# Patient Record
Sex: Female | Born: 1940 | Race: White | Hispanic: No | Smoking: Never smoker
Health system: Southern US, Community
[De-identification: ages and names within clinical notes are randomized; demographics above are authoritative.]

## PROBLEM LIST (undated history)

## (undated) DIAGNOSIS — F0280 Dementia in other diseases classified elsewhere without behavioral disturbance: Secondary | ICD-10-CM

## (undated) DIAGNOSIS — E079 Disorder of thyroid, unspecified: Secondary | ICD-10-CM

## (undated) DIAGNOSIS — I639 Cerebral infarction, unspecified: Secondary | ICD-10-CM

## (undated) DIAGNOSIS — M5136 Other intervertebral disc degeneration, lumbar region: Secondary | ICD-10-CM

## (undated) DIAGNOSIS — M4800 Spinal stenosis, site unspecified: Secondary | ICD-10-CM

## (undated) DIAGNOSIS — F259 Schizoaffective disorder, unspecified: Secondary | ICD-10-CM

## (undated) DIAGNOSIS — E119 Type 2 diabetes mellitus without complications: Secondary | ICD-10-CM

## (undated) DIAGNOSIS — J309 Allergic rhinitis, unspecified: Secondary | ICD-10-CM

## (undated) DIAGNOSIS — F0390 Unspecified dementia without behavioral disturbance: Secondary | ICD-10-CM

## (undated) DIAGNOSIS — N3010 Interstitial cystitis (chronic) without hematuria: Secondary | ICD-10-CM

## (undated) DIAGNOSIS — E7800 Pure hypercholesterolemia, unspecified: Secondary | ICD-10-CM

## (undated) DIAGNOSIS — F419 Anxiety disorder, unspecified: Secondary | ICD-10-CM

## (undated) DIAGNOSIS — G309 Alzheimer's disease, unspecified: Secondary | ICD-10-CM

## (undated) DIAGNOSIS — M51369 Other intervertebral disc degeneration, lumbar region without mention of lumbar back pain or lower extremity pain: Secondary | ICD-10-CM

## (undated) DIAGNOSIS — I10 Essential (primary) hypertension: Secondary | ICD-10-CM

## (undated) DIAGNOSIS — F250 Schizoaffective disorder, bipolar type: Secondary | ICD-10-CM

## (undated) DIAGNOSIS — I493 Ventricular premature depolarization: Secondary | ICD-10-CM

## (undated) HISTORY — PX: IMPLANTATION / PLACEMENT EPIDURAL NEUROSTIMULATOR ELECTRODES: SUR687

## (undated) HISTORY — PX: TUBAL LIGATION: SHX77

## (undated) HISTORY — DX: Interstitial cystitis (chronic) without hematuria: N30.10

## (undated) HISTORY — DX: Spinal stenosis, site unspecified: M48.00

## (undated) HISTORY — DX: Other intervertebral disc degeneration, lumbar region: M51.36

## (undated) HISTORY — DX: Type 2 diabetes mellitus without complications: E11.9

## (undated) HISTORY — DX: Ventricular premature depolarization: I49.3

## (undated) HISTORY — DX: Other intervertebral disc degeneration, lumbar region without mention of lumbar back pain or lower extremity pain: M51.369

## (undated) HISTORY — PX: FOOT SURGERY: SHX648

## (undated) HISTORY — PX: OTHER SURGICAL HISTORY: SHX169

## (undated) HISTORY — DX: Anxiety disorder, unspecified: F41.9

## (undated) HISTORY — DX: Dementia in other diseases classified elsewhere, unspecified severity, without behavioral disturbance, psychotic disturbance, mood disturbance, and anxiety: F02.80

## (undated) HISTORY — DX: Allergic rhinitis, unspecified: J30.9

## (undated) HISTORY — DX: Alzheimer's disease, unspecified: G30.9

## (undated) HISTORY — DX: Pure hypercholesterolemia, unspecified: E78.00

---

## 1998-01-22 ENCOUNTER — Encounter: Admission: RE | Admit: 1998-01-22 | Discharge: 1998-04-22 | Payer: Self-pay | Admitting: Anesthesiology

## 1998-03-11 ENCOUNTER — Other Ambulatory Visit: Admission: RE | Admit: 1998-03-11 | Discharge: 1998-03-11 | Payer: Self-pay | Admitting: *Deleted

## 1998-06-24 ENCOUNTER — Encounter: Admission: RE | Admit: 1998-06-24 | Discharge: 1998-09-22 | Payer: Self-pay | Admitting: Anesthesiology

## 1998-10-23 ENCOUNTER — Encounter: Payer: Self-pay | Admitting: Urology

## 1998-10-28 ENCOUNTER — Observation Stay (HOSPITAL_COMMUNITY): Admission: RE | Admit: 1998-10-28 | Discharge: 1998-10-29 | Payer: Self-pay | Admitting: Urology

## 1999-08-07 ENCOUNTER — Ambulatory Visit (HOSPITAL_COMMUNITY): Admission: RE | Admit: 1999-08-07 | Discharge: 1999-08-07 | Payer: Self-pay | Admitting: *Deleted

## 2000-08-05 ENCOUNTER — Ambulatory Visit (HOSPITAL_COMMUNITY): Admission: RE | Admit: 2000-08-05 | Discharge: 2000-08-05 | Payer: Self-pay | Admitting: Anesthesiology

## 2000-08-08 ENCOUNTER — Encounter: Payer: Self-pay | Admitting: Anesthesiology

## 2000-08-08 ENCOUNTER — Ambulatory Visit (HOSPITAL_COMMUNITY): Admission: RE | Admit: 2000-08-08 | Discharge: 2000-08-08 | Payer: Self-pay | Admitting: Anesthesiology

## 2000-11-13 ENCOUNTER — Emergency Department (HOSPITAL_COMMUNITY): Admission: EM | Admit: 2000-11-13 | Discharge: 2000-11-13 | Payer: Self-pay | Admitting: Emergency Medicine

## 2001-02-01 ENCOUNTER — Encounter: Payer: Self-pay | Admitting: Internal Medicine

## 2001-02-01 ENCOUNTER — Encounter: Admission: RE | Admit: 2001-02-01 | Discharge: 2001-02-01 | Payer: Self-pay | Admitting: Internal Medicine

## 2004-07-29 ENCOUNTER — Emergency Department (HOSPITAL_COMMUNITY): Admission: EM | Admit: 2004-07-29 | Discharge: 2004-07-30 | Payer: Self-pay

## 2004-09-29 ENCOUNTER — Emergency Department (HOSPITAL_COMMUNITY): Admission: EM | Admit: 2004-09-29 | Discharge: 2004-09-29 | Payer: Self-pay | Admitting: *Deleted

## 2004-10-29 ENCOUNTER — Encounter: Admission: RE | Admit: 2004-10-29 | Discharge: 2004-10-29 | Payer: Self-pay | Admitting: Otolaryngology

## 2004-11-23 ENCOUNTER — Emergency Department (HOSPITAL_COMMUNITY): Admission: EM | Admit: 2004-11-23 | Discharge: 2004-11-23 | Payer: Self-pay | Admitting: Emergency Medicine

## 2004-11-23 ENCOUNTER — Encounter: Admission: RE | Admit: 2004-11-23 | Discharge: 2004-11-23 | Payer: Self-pay | Admitting: Internal Medicine

## 2004-11-24 ENCOUNTER — Ambulatory Visit: Payer: Self-pay | Admitting: Psychiatry

## 2004-11-24 ENCOUNTER — Inpatient Hospital Stay (HOSPITAL_COMMUNITY): Admission: RE | Admit: 2004-11-24 | Discharge: 2004-11-27 | Payer: Self-pay | Admitting: Psychiatry

## 2004-12-01 ENCOUNTER — Emergency Department (HOSPITAL_COMMUNITY): Admission: EM | Admit: 2004-12-01 | Discharge: 2004-12-01 | Payer: Self-pay | Admitting: Emergency Medicine

## 2004-12-05 ENCOUNTER — Emergency Department (HOSPITAL_COMMUNITY): Admission: EM | Admit: 2004-12-05 | Discharge: 2004-12-06 | Payer: Self-pay | Admitting: Emergency Medicine

## 2004-12-11 ENCOUNTER — Encounter: Admission: RE | Admit: 2004-12-11 | Discharge: 2004-12-11 | Payer: Self-pay | Admitting: Internal Medicine

## 2004-12-20 ENCOUNTER — Observation Stay (HOSPITAL_COMMUNITY): Admission: RE | Admit: 2004-12-20 | Discharge: 2004-12-23 | Payer: Self-pay | Admitting: Emergency Medicine

## 2004-12-21 ENCOUNTER — Encounter: Payer: Self-pay | Admitting: Cardiology

## 2004-12-25 ENCOUNTER — Other Ambulatory Visit (HOSPITAL_COMMUNITY): Admission: RE | Admit: 2004-12-25 | Discharge: 2005-03-25 | Payer: Self-pay | Admitting: Psychiatry

## 2004-12-25 ENCOUNTER — Ambulatory Visit: Payer: Self-pay | Admitting: Psychiatry

## 2005-01-22 ENCOUNTER — Ambulatory Visit (HOSPITAL_COMMUNITY): Payer: Self-pay | Admitting: Psychiatry

## 2005-01-27 ENCOUNTER — Ambulatory Visit (HOSPITAL_COMMUNITY): Payer: Self-pay | Admitting: Psychiatry

## 2005-02-03 ENCOUNTER — Ambulatory Visit: Payer: Self-pay | Admitting: Psychiatry

## 2005-02-03 ENCOUNTER — Ambulatory Visit (HOSPITAL_COMMUNITY): Payer: Self-pay | Admitting: Psychiatry

## 2008-04-15 ENCOUNTER — Emergency Department (HOSPITAL_COMMUNITY): Admission: EM | Admit: 2008-04-15 | Discharge: 2008-04-16 | Payer: Self-pay | Admitting: Emergency Medicine

## 2008-08-08 ENCOUNTER — Emergency Department (HOSPITAL_COMMUNITY): Admission: EM | Admit: 2008-08-08 | Discharge: 2008-08-09 | Payer: Self-pay | Admitting: Emergency Medicine

## 2008-11-19 ENCOUNTER — Encounter: Admission: RE | Admit: 2008-11-19 | Discharge: 2008-11-19 | Payer: Self-pay | Admitting: Family Medicine

## 2008-12-20 ENCOUNTER — Emergency Department (HOSPITAL_COMMUNITY): Admission: EM | Admit: 2008-12-20 | Discharge: 2008-12-20 | Payer: Self-pay | Admitting: Emergency Medicine

## 2009-04-02 ENCOUNTER — Emergency Department (HOSPITAL_COMMUNITY): Admission: EM | Admit: 2009-04-02 | Discharge: 2009-04-02 | Payer: Self-pay | Admitting: Emergency Medicine

## 2009-04-08 ENCOUNTER — Inpatient Hospital Stay (HOSPITAL_COMMUNITY): Admission: EM | Admit: 2009-04-08 | Discharge: 2009-04-12 | Payer: Self-pay | Admitting: Emergency Medicine

## 2009-11-01 ENCOUNTER — Emergency Department (HOSPITAL_BASED_OUTPATIENT_CLINIC_OR_DEPARTMENT_OTHER): Admission: EM | Admit: 2009-11-01 | Discharge: 2009-11-01 | Payer: Self-pay | Admitting: Emergency Medicine

## 2009-11-01 ENCOUNTER — Ambulatory Visit: Payer: Self-pay | Admitting: Radiology

## 2009-11-20 ENCOUNTER — Ambulatory Visit: Payer: Self-pay | Admitting: Cardiology

## 2009-11-20 ENCOUNTER — Inpatient Hospital Stay (HOSPITAL_COMMUNITY): Admission: EM | Admit: 2009-11-20 | Discharge: 2009-11-26 | Payer: Self-pay | Admitting: Emergency Medicine

## 2009-11-21 ENCOUNTER — Encounter (INDEPENDENT_AMBULATORY_CARE_PROVIDER_SITE_OTHER): Payer: Self-pay | Admitting: Internal Medicine

## 2009-12-26 ENCOUNTER — Encounter: Admission: RE | Admit: 2009-12-26 | Discharge: 2009-12-26 | Payer: Self-pay | Admitting: Family Medicine

## 2010-05-26 ENCOUNTER — Emergency Department (HOSPITAL_COMMUNITY)
Admission: EM | Admit: 2010-05-26 | Discharge: 2010-08-31 | Payer: Self-pay | Source: Home / Self Care | Admitting: Emergency Medicine

## 2010-06-28 IMAGING — CT CT HEAD W/O CM
1 of 2 series · 16 of 30 positions shown, 20 images · non-contrast
Comparison: The head CT 12/20/2008

CLINICAL DATA: Medical clearance.  History of psychiatric disorder
spleen

CT HEAD WITHOUT CONTRAST
TECHNIQUE: Contiguous axial images were obtained from the base of
the skull through the vertex without contrast.

[Series 3: recon 2: brain · axial · 0.47mm/px · z∈[+127,+273]mm · 16 of 64 slices shown, 20 images]
[im 4/64  brain]
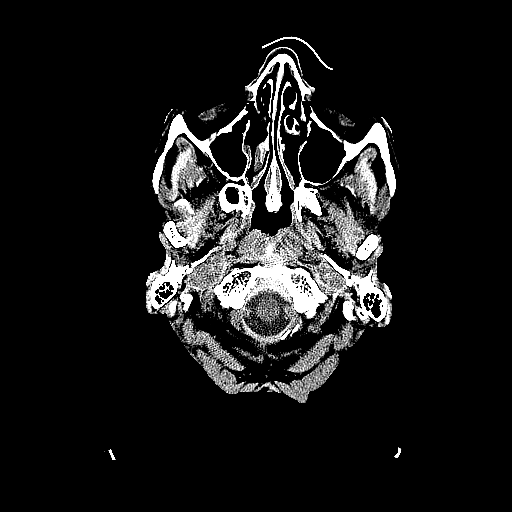
[im 4/64  bone]
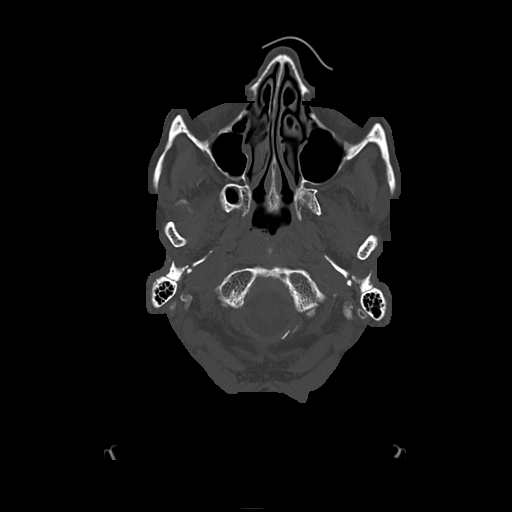
[im 7/64  brain]
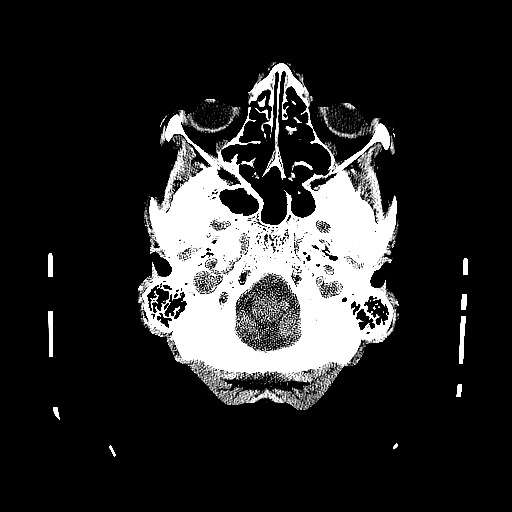
[im 10/64  brain]
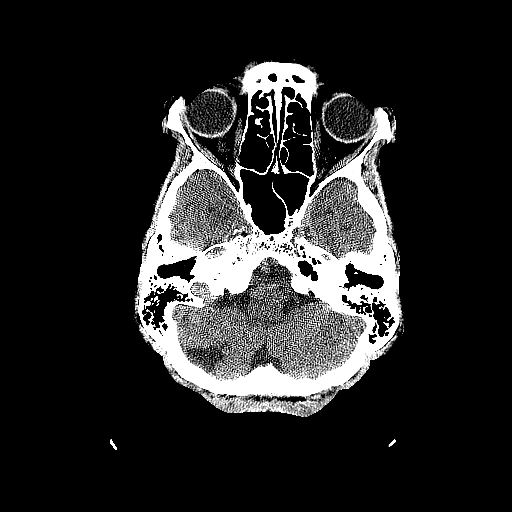
[im 14/64  brain]
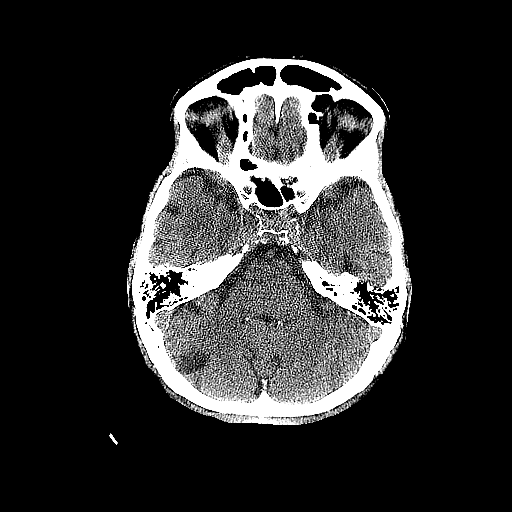
[im 20/64  brain]
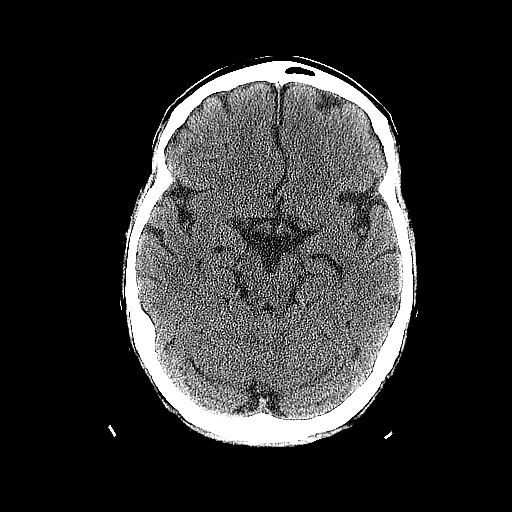
[im 20/64  bone]
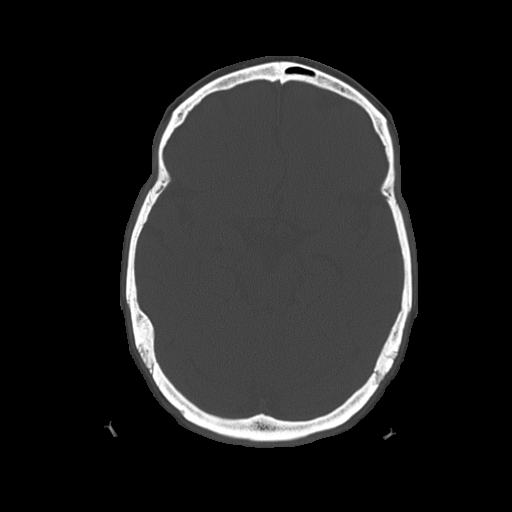
[im 24/64  brain]
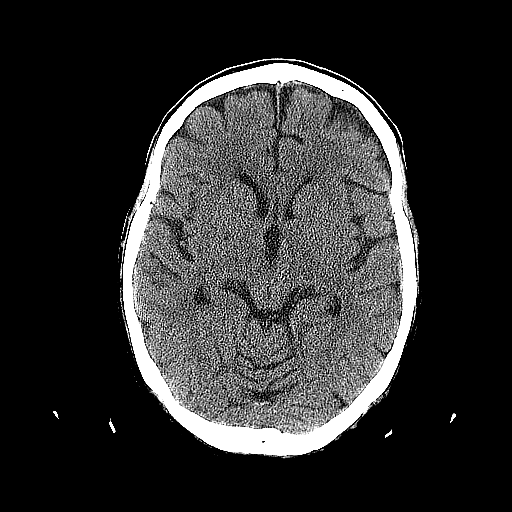
[im 27/64  brain]
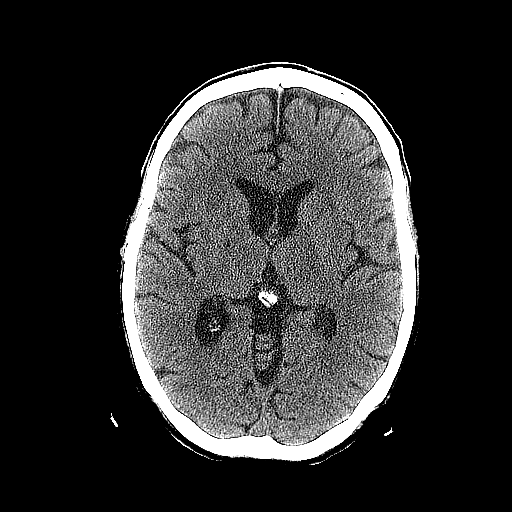
[im 30/64  brain]
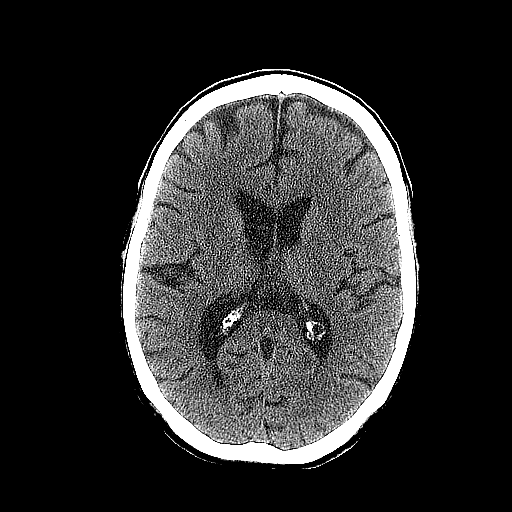
[im 34/64  brain]
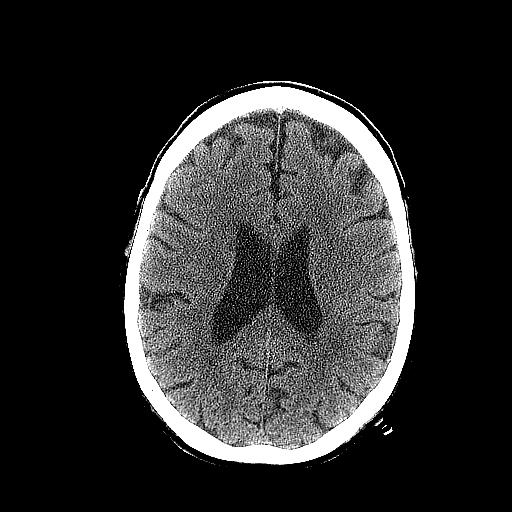
[im 34/64  bone]
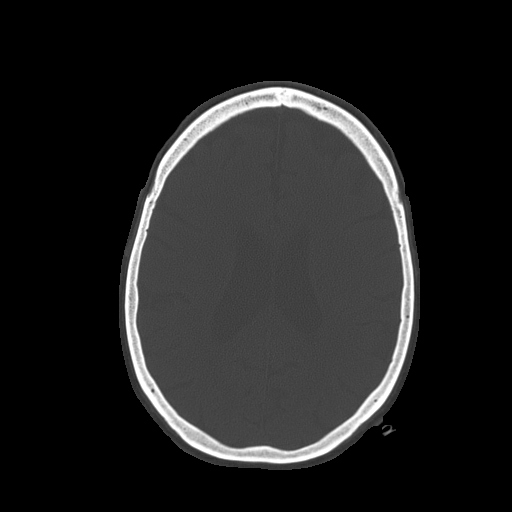
[im 37/64  brain]
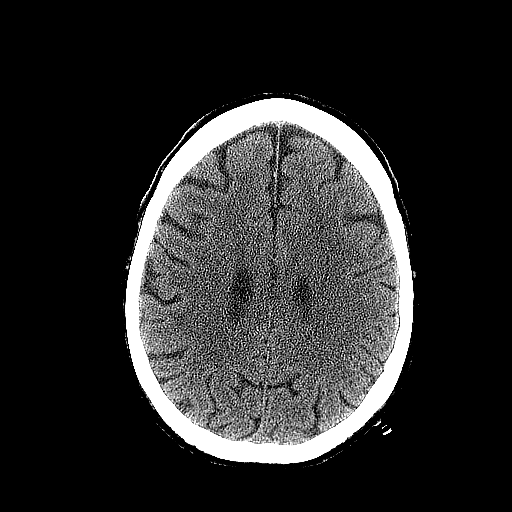
[im 40/64  brain]
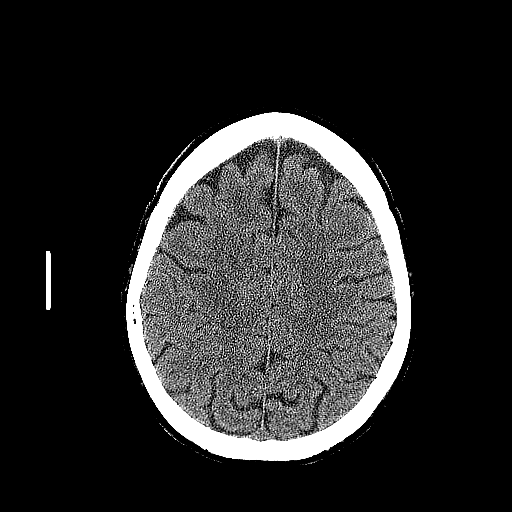
[im 44/64  brain]
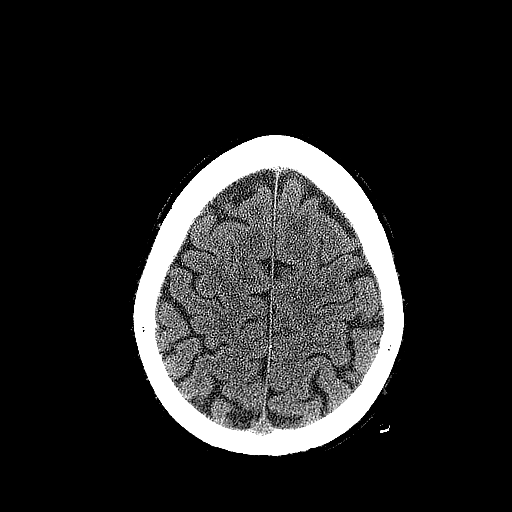
[im 50/64  brain]
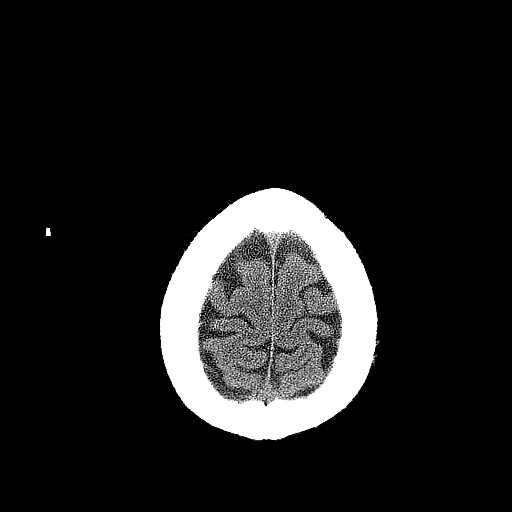
[im 50/64  bone]
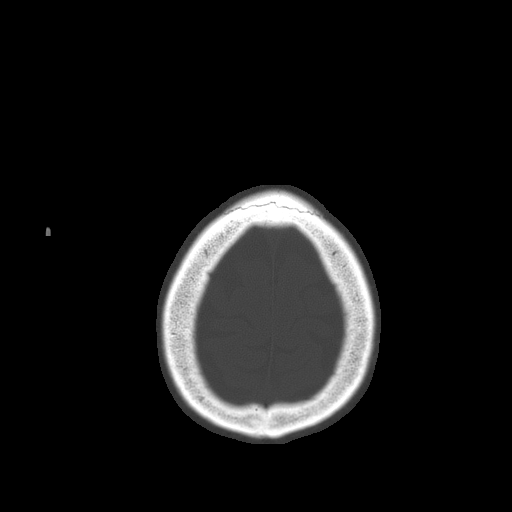
[im 54/64  brain]
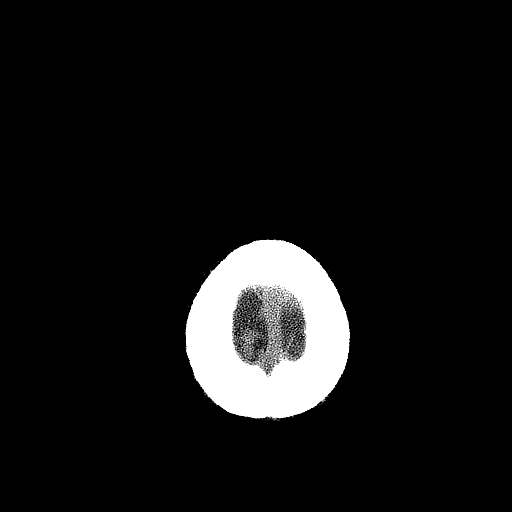
[im 57/64  brain]
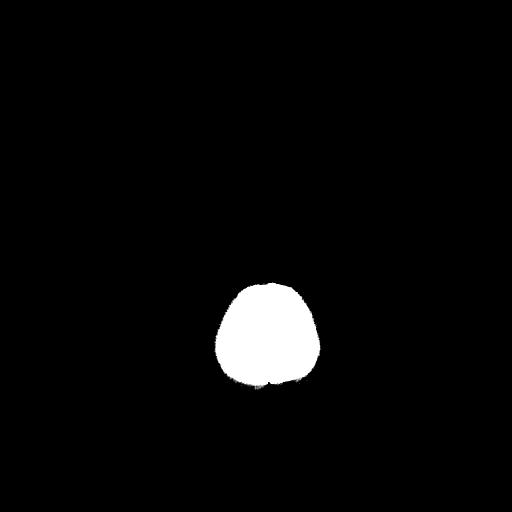
[im 60/64  brain]
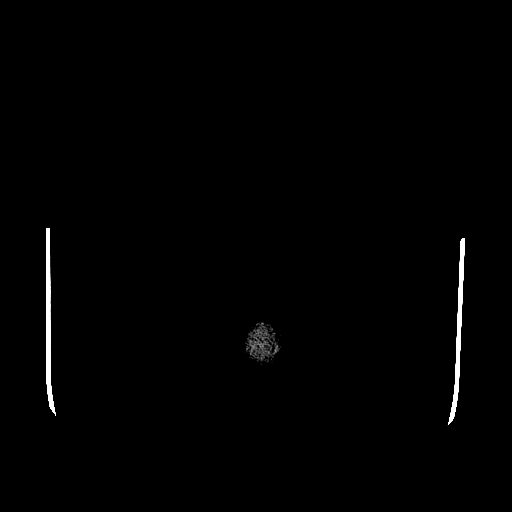

[16 of 30 positions shown; findings below may reference images not displayed]

FINDINGS: Appearance of the brain is stable.  A wedge-shaped area
of hypoattenuation in the right cerebellar hemisphere is unchanged
and compatible with remote cerebellar infarction.  Remote lacunar
infarction within the right putamen is stable. Mild cerebral volume
loss is stable.

Negative for hemorrhage, hydrocephalus, midline shift, mass lesion,
or evidence of acute infarction.

The visualized paranasal sinuses and mastoid air cells are clear.
The calvarium is intact.
IMPRESSION: No acute intracranial abnormality.  Stable head CT.

## 2010-08-19 ENCOUNTER — Emergency Department (HOSPITAL_COMMUNITY)
Admission: EM | Admit: 2010-08-19 | Discharge: 2010-08-19 | Payer: Self-pay | Source: Home / Self Care | Admitting: Emergency Medicine

## 2010-08-22 ENCOUNTER — Encounter: Payer: Self-pay | Admitting: Internal Medicine

## 2010-08-23 ENCOUNTER — Encounter: Payer: Self-pay | Admitting: Otolaryngology

## 2010-08-24 LAB — DIFFERENTIAL
Basophils Absolute: 0 10*3/uL (ref 0.0–0.1)
Basophils Relative: 1 % (ref 0–1)
Eosinophils Absolute: 0.1 10*3/uL (ref 0.0–0.7)
Eosinophils Relative: 2 % (ref 0–5)
Lymphocytes Relative: 28 % (ref 12–46)
Monocytes Absolute: 0.4 10*3/uL (ref 0.1–1.0)
Neutro Abs: 3.2 10*3/uL (ref 1.7–7.7)
Neutrophils Relative %: 62 % (ref 43–77)

## 2010-08-24 LAB — POCT CARDIAC MARKERS
CKMB, poc: 3.3 ng/mL (ref 1.0–8.0)
Troponin i, poc: <0.05 ng/mL (ref 0.00–0.09)

## 2010-08-24 LAB — CBC
Hemoglobin: 13.7 g/dL (ref 12.0–15.0)
MCH: 30.6 pg (ref 26.0–34.0)
MCV: 91.1 fL (ref 78.0–100.0)
Platelets: 175 10*3/uL (ref 150–400)
RBC: 4.48 MIL/uL (ref 3.87–5.11)
WBC: 5.1 10*3/uL (ref 4.0–10.5)

## 2010-08-24 LAB — POCT I-STAT, CHEM 8
BUN: 27 mg/dL — ABNORMAL HIGH (ref 6–23)
Creatinine, Ser: 0.7 mg/dL (ref 0.4–1.2)
HCT: 42 % (ref 36.0–46.0)
Hemoglobin: 14.3 g/dL (ref 12.0–15.0)
Potassium: 3.3 mEq/L — ABNORMAL LOW (ref 3.5–5.1)
Sodium: 141 mEq/L (ref 135–145)
TCO2: 30 mmol/L (ref 0–100)

## 2010-08-24 LAB — URINALYSIS, ROUTINE W REFLEX MICROSCOPIC
Bilirubin Urine: NEGATIVE
Nitrite: NEGATIVE
Specific Gravity, Urine: 1.022 (ref 1.005–1.030)
Urobilinogen, UA: 0.2 mg/dL (ref 0.0–1.0)
pH: 6.5 (ref 5.0–8.0)

## 2010-08-24 LAB — URINE MICROSCOPIC-ADD ON

## 2010-08-24 LAB — URINE CULTURE: Colony Count: 55000

## 2010-10-14 LAB — COMPREHENSIVE METABOLIC PANEL
AST: 26 U/L (ref 0–37)
Albumin: 3.7 g/dL (ref 3.5–5.2)
Alkaline Phosphatase: 69 U/L (ref 39–117)
BUN: 38 mg/dL — ABNORMAL HIGH (ref 6–23)
Chloride: 107 mEq/L (ref 96–112)
GFR calc Af Amer: >60 mL/min (ref >60)
Potassium: 3.9 mEq/L (ref 3.5–5.1)
Total Bilirubin: 1.1 mg/dL (ref 0.3–1.2)
Total Protein: 6.5 g/dL (ref 6.0–8.3)

## 2010-10-14 LAB — URINALYSIS, ROUTINE W REFLEX MICROSCOPIC
Bilirubin Urine: NEGATIVE
Hgb urine dipstick: NEGATIVE
Ketones, ur: 15 mg/dL — AB
Specific Gravity, Urine: 1.027 (ref 1.005–1.030)
pH: 5.5 (ref 5.0–8.0)

## 2010-10-14 LAB — RAPID URINE DRUG SCREEN, HOSP PERFORMED
Barbiturates: NOT DETECTED
Cocaine: NOT DETECTED
Opiates: NOT DETECTED

## 2010-10-14 LAB — DIFFERENTIAL
Basophils Relative: 0 % (ref 0–1)
Eosinophils Relative: 0 % (ref 0–5)
Lymphocytes Relative: 13 % (ref 12–46)
Monocytes Absolute: 0.5 10*3/uL (ref 0.1–1.0)
Monocytes Relative: 6 % (ref 3–12)
Neutro Abs: 5.8 10*3/uL (ref 1.7–7.7)

## 2010-10-14 LAB — CBC
MCV: 88.5 fL (ref 78.0–100.0)
Platelets: 185 10*3/uL (ref 150–400)
RBC: 4.8 MIL/uL (ref 3.87–5.11)
RDW: 11.9 % (ref 11.5–15.5)
WBC: 7.2 10*3/uL (ref 4.0–10.5)

## 2010-10-14 LAB — URINE MICROSCOPIC-ADD ON

## 2010-10-14 LAB — GLUCOSE, CAPILLARY: Glucose-Capillary: 119 mg/dL — ABNORMAL HIGH (ref 70–99)

## 2010-10-14 LAB — ETHANOL: Alcohol, Ethyl (B): <5 mg/dL (ref 0–10)

## 2010-10-20 LAB — DIFFERENTIAL
Eosinophils Relative: 2 % (ref 0–5)
Lymphocytes Relative: 23 % (ref 12–46)
Lymphs Abs: 1.4 10*3/uL (ref 0.7–4.0)
Monocytes Absolute: 0.4 10*3/uL (ref 0.1–1.0)
Monocytes Relative: 7 % (ref 3–12)

## 2010-10-20 LAB — COMPREHENSIVE METABOLIC PANEL
AST: 20 U/L (ref 0–37)
Albumin: 3.5 g/dL (ref 3.5–5.2)
Calcium: 9 mg/dL (ref 8.4–10.5)
Creatinine, Ser: 0.63 mg/dL (ref 0.4–1.2)
GFR calc Af Amer: >60 mL/min (ref >60)
Sodium: 138 mEq/L (ref 135–145)

## 2010-10-20 LAB — BASIC METABOLIC PANEL
Calcium: 9.1 mg/dL (ref 8.4–10.5)
GFR calc Af Amer: >60 mL/min (ref >60)
GFR calc non Af Amer: >60 mL/min (ref >60)
Sodium: 144 mEq/L (ref 135–145)

## 2010-10-20 LAB — CBC
HCT: 35.2 % — ABNORMAL LOW (ref 36.0–46.0)
Hemoglobin: 11.5 g/dL — ABNORMAL LOW (ref 12.0–15.0)
Hemoglobin: 11.9 g/dL — ABNORMAL LOW (ref 12.0–15.0)
MCHC: 34 g/dL (ref 30.0–36.0)
RBC: 3.6 MIL/uL — ABNORMAL LOW (ref 3.87–5.11)
RBC: 3.74 MIL/uL — ABNORMAL LOW (ref 3.87–5.11)
WBC: 5.9 10*3/uL (ref 4.0–10.5)

## 2010-10-20 LAB — CARDIAC PANEL(CRET KIN+CKTOT+MB+TROPI)
CK, MB: 0.8 ng/mL (ref 0.3–4.0)
CK, MB: 0.8 ng/mL (ref 0.3–4.0)
Relative Index: INVALID (ref 0.0–2.5)
Total CK: 28 U/L (ref 7–177)
Total CK: 33 U/L (ref 7–177)

## 2010-10-20 LAB — PROTIME-INR: Prothrombin Time: 12.6 seconds (ref 11.6–15.2)

## 2010-10-20 LAB — POCT I-STAT, CHEM 8
BUN: 35 mg/dL — ABNORMAL HIGH (ref 6–23)
Chloride: 108 mEq/L (ref 96–112)
Creatinine, Ser: 0.8 mg/dL (ref 0.4–1.2)
Glucose, Bld: 110 mg/dL — ABNORMAL HIGH (ref 70–99)
HCT: 35 % — ABNORMAL LOW (ref 36.0–46.0)
Potassium: 4.1 mEq/L (ref 3.5–5.1)

## 2010-10-20 LAB — POCT CARDIAC MARKERS
CKMB, poc: <1 ng/mL — ABNORMAL LOW (ref 1.0–8.0)
Troponin i, poc: <0.05 ng/mL (ref 0.00–0.09)

## 2010-10-20 LAB — CK TOTAL AND CKMB (NOT AT ARMC): CK, MB: 0.9 ng/mL (ref 0.3–4.0)

## 2010-10-21 LAB — POCT CARDIAC MARKERS
CKMB, poc: 1.3 ng/mL (ref 1.0–8.0)
CKMB, poc: 2.2 ng/mL (ref 1.0–8.0)
Troponin i, poc: <0.05 ng/mL (ref 0.00–0.09)

## 2010-10-21 LAB — DIFFERENTIAL
Basophils Absolute: 0 10*3/uL (ref 0.0–0.1)
Basophils Relative: 0 % (ref 0–1)
Eosinophils Relative: 0 % (ref 0–5)
Lymphocytes Relative: 12 % (ref 12–46)
Lymphocytes Relative: 14 % (ref 12–46)
Lymphs Abs: 1 10*3/uL (ref 0.7–4.0)
Monocytes Absolute: 0.3 10*3/uL (ref 0.1–1.0)
Monocytes Relative: 4 % (ref 3–12)
Monocytes Relative: 5 % (ref 3–12)
Neutro Abs: 7.3 10*3/uL (ref 1.7–7.7)
Neutrophils Relative %: 83 % — ABNORMAL HIGH (ref 43–77)

## 2010-10-21 LAB — URINALYSIS, ROUTINE W REFLEX MICROSCOPIC
Bilirubin Urine: NEGATIVE
Leukocytes, UA: NEGATIVE
Nitrite: NEGATIVE
Specific Gravity, Urine: 1.019 (ref 1.005–1.030)
Urobilinogen, UA: 0.2 mg/dL (ref 0.0–1.0)

## 2010-10-21 LAB — COMPREHENSIVE METABOLIC PANEL
BUN: 25 mg/dL — ABNORMAL HIGH (ref 6–23)
Calcium: 9.8 mg/dL (ref 8.4–10.5)
Glucose, Bld: 154 mg/dL — ABNORMAL HIGH (ref 70–99)
Sodium: 144 mEq/L (ref 135–145)
Total Protein: 7.6 g/dL (ref 6.0–8.3)

## 2010-10-21 LAB — URINE CULTURE: Colony Count: 10000

## 2010-10-21 LAB — CBC
HCT: 38.7 % (ref 36.0–46.0)
HCT: 41.8 % (ref 36.0–46.0)
Hemoglobin: 13 g/dL (ref 12.0–15.0)
Hemoglobin: 13.7 g/dL (ref 12.0–15.0)
MCHC: 32.8 g/dL (ref 30.0–36.0)
MCHC: 33.7 g/dL (ref 30.0–36.0)
RDW: 12.1 % (ref 11.5–15.5)
RDW: 12.3 % (ref 11.5–15.5)

## 2010-10-21 LAB — POCT TOXICOLOGY PANEL

## 2010-10-21 LAB — URINE MICROSCOPIC-ADD ON

## 2010-10-21 LAB — BASIC METABOLIC PANEL
CO2: 29 mEq/L (ref 19–32)
Glucose, Bld: 173 mg/dL — ABNORMAL HIGH (ref 70–99)
Potassium: 3.9 mEq/L (ref 3.5–5.1)
Sodium: 143 mEq/L (ref 135–145)

## 2010-10-31 ENCOUNTER — Emergency Department (INDEPENDENT_AMBULATORY_CARE_PROVIDER_SITE_OTHER): Payer: Medicare Other

## 2010-10-31 ENCOUNTER — Emergency Department (HOSPITAL_BASED_OUTPATIENT_CLINIC_OR_DEPARTMENT_OTHER)
Admission: EM | Admit: 2010-10-31 | Discharge: 2010-10-31 | Disposition: A | Discharge status: Home / Self Care | Payer: Medicare Other | Attending: Emergency Medicine | Admitting: Emergency Medicine

## 2010-10-31 DIAGNOSIS — M542 Cervicalgia: Secondary | ICD-10-CM

## 2010-10-31 DIAGNOSIS — M25519 Pain in unspecified shoulder: Secondary | ICD-10-CM | POA: Insufficient documentation

## 2010-10-31 DIAGNOSIS — G9389 Other specified disorders of brain: Secondary | ICD-10-CM | POA: Insufficient documentation

## 2010-10-31 DIAGNOSIS — F0390 Unspecified dementia without behavioral disturbance: Secondary | ICD-10-CM | POA: Insufficient documentation

## 2010-10-31 DIAGNOSIS — I10 Essential (primary) hypertension: Secondary | ICD-10-CM | POA: Insufficient documentation

## 2010-10-31 DIAGNOSIS — Z79899 Other long term (current) drug therapy: Secondary | ICD-10-CM | POA: Insufficient documentation

## 2010-10-31 DIAGNOSIS — W06XXXA Fall from bed, initial encounter: Secondary | ICD-10-CM | POA: Insufficient documentation

## 2010-10-31 DIAGNOSIS — Y92009 Unspecified place in unspecified non-institutional (private) residence as the place of occurrence of the external cause: Secondary | ICD-10-CM | POA: Insufficient documentation

## 2010-10-31 DIAGNOSIS — Z8679 Personal history of other diseases of the circulatory system: Secondary | ICD-10-CM | POA: Insufficient documentation

## 2010-10-31 DIAGNOSIS — E041 Nontoxic single thyroid nodule: Secondary | ICD-10-CM

## 2010-10-31 DIAGNOSIS — E049 Nontoxic goiter, unspecified: Secondary | ICD-10-CM | POA: Insufficient documentation

## 2010-10-31 DIAGNOSIS — F319 Bipolar disorder, unspecified: Secondary | ICD-10-CM | POA: Insufficient documentation

## 2010-10-31 DIAGNOSIS — E039 Hypothyroidism, unspecified: Secondary | ICD-10-CM | POA: Insufficient documentation

## 2010-10-31 DIAGNOSIS — I6990 Unspecified sequelae of unspecified cerebrovascular disease: Secondary | ICD-10-CM

## 2010-10-31 DIAGNOSIS — R51 Headache: Secondary | ICD-10-CM

## 2010-10-31 LAB — URINALYSIS, ROUTINE W REFLEX MICROSCOPIC
Hgb urine dipstick: NEGATIVE
Nitrite: NEGATIVE
Protein, ur: NEGATIVE mg/dL
Specific Gravity, Urine: 1.018 (ref 1.005–1.030)
Urobilinogen, UA: 0.2 mg/dL (ref 0.0–1.0)

## 2010-11-06 LAB — DIFFERENTIAL
Basophils Absolute: 0 10*3/uL (ref 0.0–0.1)
Basophils Relative: 0 % (ref 0–1)
Basophils Relative: 0 % (ref 0–1)
Basophils Relative: 1 % (ref 0–1)
Eosinophils Absolute: 0 10*3/uL (ref 0.0–0.7)
Eosinophils Absolute: 0.1 10*3/uL (ref 0.0–0.7)
Eosinophils Relative: 2 % (ref 0–5)
Lymphs Abs: 1 10*3/uL (ref 0.7–4.0)
Lymphs Abs: 1.6 10*3/uL (ref 0.7–4.0)
Monocytes Absolute: 0.4 10*3/uL (ref 0.1–1.0)
Monocytes Relative: 10 % (ref 3–12)
Monocytes Relative: 8 % (ref 3–12)
Monocytes Relative: 8 % (ref 3–12)
Neutro Abs: 2.5 10*3/uL (ref 1.7–7.7)
Neutro Abs: 3.6 10*3/uL (ref 1.7–7.7)
Neutrophils Relative %: 60 % (ref 43–77)
Neutrophils Relative %: 62 % (ref 43–77)

## 2010-11-06 LAB — RAPID URINE DRUG SCREEN, HOSP PERFORMED
Amphetamines: NOT DETECTED
Benzodiazepines: NOT DETECTED
Benzodiazepines: NOT DETECTED
Cocaine: NOT DETECTED
Opiates: NOT DETECTED
Opiates: NOT DETECTED
Tetrahydrocannabinol: NOT DETECTED
Tetrahydrocannabinol: NOT DETECTED

## 2010-11-06 LAB — TSH: TSH: 2.08 u[IU]/mL (ref 0.350–4.500)

## 2010-11-06 LAB — CBC
Hemoglobin: 10.1 g/dL — ABNORMAL LOW (ref 12.0–15.0)
MCHC: 34 g/dL (ref 30.0–36.0)
MCHC: 34.2 g/dL (ref 30.0–36.0)
MCV: 93.5 fL (ref 78.0–100.0)
Platelets: 154 10*3/uL (ref 150–400)
Platelets: 211 10*3/uL (ref 150–400)
RBC: 3.18 MIL/uL — ABNORMAL LOW (ref 3.87–5.11)
RBC: 4.54 MIL/uL (ref 3.87–5.11)
RDW: 12.8 % (ref 11.5–15.5)
RDW: 13 % (ref 11.5–15.5)
WBC: 3.7 10*3/uL — ABNORMAL LOW (ref 4.0–10.5)

## 2010-11-06 LAB — GLUCOSE, CAPILLARY
Glucose-Capillary: 109 mg/dL — ABNORMAL HIGH (ref 70–99)
Glucose-Capillary: 119 mg/dL — ABNORMAL HIGH (ref 70–99)

## 2010-11-06 LAB — COMPREHENSIVE METABOLIC PANEL
ALT: 10 U/L (ref 0–35)
ALT: 14 U/L (ref 0–35)
AST: 20 U/L (ref 0–37)
Albumin: 3.9 g/dL (ref 3.5–5.2)
Alkaline Phosphatase: 52 U/L (ref 39–117)
Alkaline Phosphatase: 68 U/L (ref 39–117)
BUN: 22 mg/dL (ref 6–23)
CO2: 24 mEq/L (ref 19–32)
Calcium: 8.1 mg/dL — ABNORMAL LOW (ref 8.4–10.5)
Calcium: 9.4 mg/dL (ref 8.4–10.5)
GFR calc Af Amer: >60 mL/min (ref >60)
GFR calc non Af Amer: >60 mL/min (ref >60)
Glucose, Bld: 158 mg/dL — ABNORMAL HIGH (ref 70–99)
Potassium: 3.3 mEq/L — ABNORMAL LOW (ref 3.5–5.1)
Potassium: 4 mEq/L (ref 3.5–5.1)
Sodium: 140 mEq/L (ref 135–145)
Sodium: 143 mEq/L (ref 135–145)
Total Protein: 6.6 g/dL (ref 6.0–8.3)

## 2010-11-06 LAB — T4, FREE: Free T4: 1.25 ng/dL (ref 0.80–1.80)

## 2010-11-06 LAB — POCT I-STAT, CHEM 8
Chloride: 105 mEq/L (ref 96–112)
HCT: 44 % (ref 36.0–46.0)
Hemoglobin: 15 g/dL (ref 12.0–15.0)
Potassium: 3.6 mEq/L (ref 3.5–5.1)
Sodium: 141 mEq/L (ref 135–145)

## 2010-11-06 LAB — URINALYSIS, ROUTINE W REFLEX MICROSCOPIC
Bilirubin Urine: NEGATIVE
Bilirubin Urine: NEGATIVE
Glucose, UA: NEGATIVE mg/dL
Glucose, UA: NEGATIVE mg/dL
Hgb urine dipstick: NEGATIVE
Ketones, ur: 15 mg/dL — AB
Ketones, ur: NEGATIVE mg/dL
Protein, ur: NEGATIVE mg/dL
Specific Gravity, Urine: 1.026 (ref 1.005–1.030)
pH: 6 (ref 5.0–8.0)
pH: 7.5 (ref 5.0–8.0)

## 2010-11-06 LAB — URINE MICROSCOPIC-ADD ON

## 2010-11-06 LAB — URINE CULTURE

## 2010-11-06 LAB — ACETAMINOPHEN LEVEL: Acetaminophen (Tylenol), Serum: <10 ug/mL — ABNORMAL LOW (ref 10–30)

## 2010-11-06 LAB — TRICYCLICS SCREEN, URINE: TCA Scrn: NOT DETECTED

## 2010-11-06 LAB — ETHANOL: Alcohol, Ethyl (B): <5 mg/dL (ref 0–10)

## 2010-11-10 LAB — CBC
Hemoglobin: 13.6 g/dL (ref 12.0–15.0)
Platelets: 167 10*3/uL (ref 150–400)
RDW: 12.2 % (ref 11.5–15.5)
WBC: 6.5 10*3/uL (ref 4.0–10.5)

## 2010-11-10 LAB — COMPREHENSIVE METABOLIC PANEL
ALT: 11 U/L (ref 0–35)
AST: 27 U/L (ref 0–37)
Albumin: 4.1 g/dL (ref 3.5–5.2)
Chloride: 101 mEq/L (ref 96–112)
Creatinine, Ser: 0.65 mg/dL (ref 0.4–1.2)
GFR calc Af Amer: >60 mL/min (ref >60)
Potassium: 3.9 mEq/L (ref 3.5–5.1)
Sodium: 140 mEq/L (ref 135–145)
Total Bilirubin: 0.8 mg/dL (ref 0.3–1.2)

## 2010-11-10 LAB — URINE MICROSCOPIC-ADD ON

## 2010-11-10 LAB — URINALYSIS, ROUTINE W REFLEX MICROSCOPIC
Bilirubin Urine: NEGATIVE
Ketones, ur: 15 mg/dL — AB
Leukocytes, UA: NEGATIVE
Nitrite: NEGATIVE
Protein, ur: 30 mg/dL — AB
Urobilinogen, UA: 1 mg/dL (ref 0.0–1.0)

## 2010-11-10 LAB — POCT CARDIAC MARKERS
CKMB, poc: <1 ng/mL — ABNORMAL LOW (ref 1.0–8.0)
Troponin i, poc: <0.05 ng/mL (ref 0.00–0.09)

## 2010-11-10 LAB — DIFFERENTIAL
Basophils Absolute: 0 10*3/uL (ref 0.0–0.1)
Lymphocytes Relative: 14 % (ref 12–46)
Lymphs Abs: 0.9 10*3/uL (ref 0.7–4.0)
Monocytes Absolute: 0.6 10*3/uL (ref 0.1–1.0)
Neutro Abs: 4.9 10*3/uL (ref 1.7–7.7)

## 2010-11-16 LAB — POCT I-STAT, CHEM 8
Calcium, Ion: 1.16 mmol/L (ref 1.12–1.32)
HCT: 48 % — ABNORMAL HIGH (ref 36.0–46.0)
TCO2: 29 mmol/L (ref 0–100)

## 2010-11-30 ENCOUNTER — Emergency Department (HOSPITAL_COMMUNITY)
Admission: EM | Admit: 2010-11-30 | Discharge: 2010-11-30 | Disposition: A | Discharge status: Home / Self Care | Payer: Medicare Other | Attending: Emergency Medicine | Admitting: Emergency Medicine

## 2010-11-30 ENCOUNTER — Emergency Department (HOSPITAL_COMMUNITY): Payer: Medicare Other

## 2010-11-30 DIAGNOSIS — M545 Low back pain, unspecified: Secondary | ICD-10-CM | POA: Insufficient documentation

## 2010-11-30 DIAGNOSIS — Z8673 Personal history of transient ischemic attack (TIA), and cerebral infarction without residual deficits: Secondary | ICD-10-CM | POA: Insufficient documentation

## 2010-11-30 DIAGNOSIS — M5430 Sciatica, unspecified side: Secondary | ICD-10-CM | POA: Insufficient documentation

## 2010-11-30 DIAGNOSIS — I10 Essential (primary) hypertension: Secondary | ICD-10-CM | POA: Insufficient documentation

## 2010-11-30 DIAGNOSIS — E039 Hypothyroidism, unspecified: Secondary | ICD-10-CM | POA: Insufficient documentation

## 2010-12-03 ENCOUNTER — Emergency Department (HOSPITAL_COMMUNITY)
Admission: EM | Admit: 2010-12-03 | Discharge: 2010-12-03 | Disposition: A | Discharge status: Home / Self Care | Payer: Medicare Other | Attending: Emergency Medicine | Admitting: Emergency Medicine

## 2010-12-03 DIAGNOSIS — R29898 Other symptoms and signs involving the musculoskeletal system: Secondary | ICD-10-CM | POA: Insufficient documentation

## 2010-12-03 DIAGNOSIS — I10 Essential (primary) hypertension: Secondary | ICD-10-CM | POA: Insufficient documentation

## 2010-12-03 DIAGNOSIS — I69998 Other sequelae following unspecified cerebrovascular disease: Secondary | ICD-10-CM | POA: Insufficient documentation

## 2010-12-03 DIAGNOSIS — E039 Hypothyroidism, unspecified: Secondary | ICD-10-CM | POA: Insufficient documentation

## 2010-12-03 DIAGNOSIS — M545 Low back pain, unspecified: Secondary | ICD-10-CM | POA: Insufficient documentation

## 2010-12-18 NOTE — Discharge Summary (Signed)
NAMESHARECE, FLEISCHHACKER            ACCOUNT NO.:  [id number]   MEDICAL RECORD NO.:  [id number]          PATIENT TYPE:  INP   LOCATION:  2031                         FACILITY:  [facility]   PHYSICIAN:  Armanda Magic, M.D.     DATE OF BIRTH:  September 24, 1940   DATE OF ADMISSION:  12/21/2004  DATE OF DISCHARGE:  12/23/2004                                 DISCHARGE SUMMARY   ADMISSION DIAGNOSES:  1.  Chest pain.  2.  Hypertension.   DISCHARGE DIAGNOSES:  1.  Chest pain, negative for myocardial infarction.  2.  Hypertension, controlled.  3.  Psychiatric issues, undergoing psychiatric evaluation.   PROCEDURES:  None.   DISCHARGE STATUS:  Stable.   HISTORY OF PRESENT ILLNESS:  Please see complete H&P for details, but in  short this is a 70 year old female who was admitted on Dec 20, 2004, with  complaint of chest pain at rest. She states the pain woke her up at 3 a.m.  She had radiation to her left arm.  She apparently also had been having  effective hallucinations and had stopped taking her antipsychotic  medications for history of psychosis and depression in the past.   PHYSICAL EXAMINATION ON ADMISSION:  Please see complete H&P, but in short  vital signs were stable. EKG was without any ischemic changes. Admission  labs were all normal including cardiac enzymes. Physical exam was within  normal limits with the exception of a right carotid bruit.   HOSPITAL COURSE:  The patient was admitted to telemetry. Cycle cardiac  enzymes. A stress Cardiolite study was scheduled to rule out ischemic cause.   All repeat cardiac enzymes were normal. She had no EKG changes. She had no  further complaints of chest pain. Vital signs remained normal. A stress  Cardiolite study was done on Dec 21, 2004, which showed no evidence of  ischemia. However, there was a suboptimal test secondary to patient unable  to reach her target heart rate secondary to fatigue. Therefore, an adenosine  Cardiolite study was set  up for the following morning. On Dec 23, 2004, an  adenosine Cardiolite study was performed on the patient. This also showed no  evidence of ischemia. She was discharged home without further incident. The  patient is to see Dr. Jeanie Sewer for outpatient psychiatric issues.   DISCHARGE MEDICATIONS:  1.  Elavil 25 mg two tablets at h.s.  2.  Seroquel 50 mg at 8 p.m. daily.  3.  Atenolol 50 mg daily.  4.  Aspirin 325 mg daily.  5.  OxyContin two tablets daily p.r.n.   She had no activity restrictions. She was instructed to maintain a low-salt  diet. She is to follow up with her primary care physician. She has an  appointment with behavioral health program on Friday, Dec 25, 2004, at 8:45  a.m.      Kellie King, N.P.      Armanda Magic, M.D.  Electronically Signed    HB/MEDQ  D:  03/14/2005  T:  03/14/2005  Job:  [id number]

## 2010-12-18 NOTE — H&P (Signed)
NAMEMarland Kitchen  King, Kellie King   Kellie King          PATIENT TYPE:  EMS   LOCATION:  ED                           FACILITY:  [facility]   PHYSICIAN:  Deirdre Peer. Polite, M.D. DATE OF BIRTH:  06/09/1941   DATE OF ADMISSION:  12/20/2004  DATE OF DISCHARGE:                                HISTORY & PHYSICAL   CHIEF COMPLAINT:  Chest pain.   HISTORY OF PRESENT ILLNESS:  Kellie King is a 70 year old female with known  history of hypertension, depression, undiagnosed psychiatric disorder  presents to the ED with complaints of chest pain 8/10 with radiation to her  left arm.  Patient states she was at home sitting at approximately 3 o'clock  this morning when she had this sensation.  She denies any shortness of  breath, palpitations or diaphoresis.  Patient states she has not had these  symptoms before.  She denies any orthopnea or PND.  Patient's husband  brought her to the ED for further evaluation.  In the ED patient was  evaluated, EKG was abnormal with anterior Q's, initial point-of-care enzymes  elevated, chest x-ray within normal limits, [facility] was called  for further evaluation and treatment.  At time of my evaluation patient is  alert and oriented x3 without chest pain describing the same event as stated  above.  At this time patient needs to be admitted for evaluation and risk  stratification to rule out coronary artery disease.  Of note patient states  she has had a heart catheterization approximately 15 years ago which was  reported as normal.  Patient states that her cholesterol status is unknown,  does not describe any early family history of coronary artery disease  although she states that her mother had CABG at age 67.  Patient's exercise  tolerance is okay at this time and she states she can walk up stairs without  difficulty and walk several blocks without symptoms.  Also please note the  patient does admit to having  active hallucinations at times talking to  groups of people.  At this time denies any thoughts of suicide or these  visions informing her to harming herself.  Patient states that this is a  chronic problem that seems to be somewhat escalating in nature.  Currently  patient is not on any antipsychotic medication.  However, admission is  deemed necessary for further evaluation of chest pain to rule out coronary  artery disease.   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   MEDICATIONS ON ADMISSION:  1.  Atenolol 25 mg p.o. once daily.  2.  Wellbutrin unknown dose.  3.  OxyContin two tabs daily otherwise unknown dose.  4.  Elavil 325 mg tabs at bedtime.  5.  Occasional multivitamin.  6.  Patient states that she also takes an occasional Advil over the last 3      days probably three times a day.   SOCIAL HISTORY:  Negative for tobacco, alcohol or drugs.  PAST SURGICAL HISTORY:  Negative for appendectomy, cholecystectomy or  hysterectomy.  Patient states that she has had some surgery on her nose for  skin CA, has had tubal ligation in the distant past, has had a stimulator  placed in her back to help with pain related to her interstitial cystitis.   ALLERGIES:  No known drug allergies.   FAMILY HISTORY:  Mother deceased, states she had a history of coronary  artery disease status post CABG at age 15; father healthy; three brothers  relatively healthy; one sister healthy.   REVIEW OF SYSTEMS:  As stated in HPI, in addition negative for fever or  chills, no nausea, no vomiting, lower abdominal pain related to interstitial  cystitis, no blood in stool or urine.   PHYSICAL EXAMINATION:  GENERAL:  The patient is somewhat disheveled in  appearance and at times nervous/agitated.  VITALS:  Temperature 97.2, BP 179/90, pulse 72,  respiratory rate 20,  saturating 100%.  HEENT:  Anicteric sclerae.  No oral lesions.  Patient has poor dentition.  No nodes.  No JVD.  There is a carotid bruit on the left.  CHEST:  Clear to auscultation bilaterally.  CARDIOVASCULAR:  Regular S1, S2.  No S3 appreciated.  ABDOMEN:  Soft, positive bowel sounds, there is discomfort in the lower  quadrants.  EXTREMITIES:  No clubbing, cyanosis or edema, 2+ pulses.  NEUROLOGIC EXAM:  Nonfocal.   DATA:  EKG sinus rhythm, poor R wave progression, no ST elevation.  Chest x-  ray no apparent disease.  Point-of-care enzymes:  Myoglobin 500, CK-MB 10.8,  troponin I less than 0.05.  BMET:  Sodium 144, potassium 3.1, chloride 108,  carbon dioxide 27, glucose 94, BUN 26, creatinine 0.8.  Lipase 19, amylase  38.   ASSESSMENT:  1.  Chest pain at rest in a patient with abnormal EKG.  2.  Undiagnosed psychiatric disorder with active hallucinations.  3.  Hypertension uncontrolled.  4.  Vertigo.  5.  Depression.  6.  Left carotid bruit on exam.  7.  Tension headaches.  8.  Chronic pain secondary to interstitial cystitis.  9.  Hypokalemia.   PLAN:  Recommend patient be admitted to telemetry floor bed.  Patient was  started on aspirin, continue beta blockers, start Lovenox, patient will have  serial cardiac enzymes, followup EKG in the morning, recommend cardiac  evaluation for risk stratification.  We will obtain lipid status as well as  TSH.  We will also obtain carotid ultrasound.  May entertain the need for  psychiatric consultation if the need arises.  We will make further  recommendations as deemed necessary.       RDP/MEDQ  D:  12/20/2004  T:  12/20/2004  Job:  [id number]   cc:   Sharlet Salina, M.D.  [address]  [place]  [place] [address]  Fax: [phone]

## 2010-12-18 NOTE — Discharge Summary (Signed)
NAME:  Kellie King, Kellie King NO.:  [id number]   MEDICAL RECORD NO.:  [id number]          PATIENT TYPE:  IPS   LOCATION:  0405                          FACILITY:  [facility]   PHYSICIAN:  Geoffery Lyons, M.D.      DATE OF BIRTH:  [birth year]   DATE OF ADMISSION:  11/24/2004  DATE OF DISCHARGE:  11/27/2004                                 DISCHARGE SUMMARY   CHIEF COMPLAINT AND PRESENT ILLNESS:  This was the first admission to [facility] for this 70 year old married white female voluntarily  admitted.  Referred by Dr. Sharlet Salina for bizarre behavior, having  various auditory hallucinations for 2-3 weeks, negative CT scan, chasing  people around, endorsing increased stress, feeling overwhelmed, fast  thoughts, cannot concentrate, not sleeping well, no appetite.  Started with  onset six weeks prior to this admission after arguing with the husband about  money.  Her dog also died.  Hearing people talking and seeing visions.   PAST PSYCHIATRIC HISTORY:  First time at [facility].  Second  psychiatric admission.  History of prior admission to [facility] 10 years prior  to this admission.  Treated by Dr. Berna Spare.   ALCOHOL/DRUG HISTORY:  Denies the use or abuse of any substances.   MEDICAL HISTORY:  Vertigo, interstitial cystitis.   MEDICATIONS:  Meclizine 25 mg twice a day and atenolol 100 mg daily.   PHYSICAL EXAMINATION:  Performed and failed to show any acute findings.   LABORATORY DATA:  CBC within normal limits.  Blood chemistry within normal  limits.  SGOT 33, SGPT 14, total bilirubin 0.9, creatinine 1.0, BUN 26.  TSH  2.861.  B12 708.  RPR nonreactive.   MENTAL STATUS EXAM:  Alert, cooperative female.  Pleasant, somewhat labile,  very anxious.  Tearful.  Speech rapid, hyperverbal.  Mood anxious.  Thought  processes somewhat circumstantial, tangential, some blocking, derailing.  No  clear suicidal or homicidal ideation.  No hallucinations.   Cognition was  well-preserved.   ADMISSION DIAGNOSES:   AXIS I:  Bipolar disorder with psychotic features.   AXIS II:  No diagnosis.   AXIS III:  1.  Interstitial cystitis.  2.  Chronic bladder pain secondary to the same.   AXIS IV:  Moderate.   AXIS V:  Global Assessment of Functioning upon admission 35; highest Global  Assessment of Functioning in the last year 60.   HOSPITAL COURSE:  She was admitted and started in individual and group  psychotherapy.  She was maintained on the meclizine 20 mg twice a day,  amitriptyline 75 mg at night, atenolol 100 mg in the morning, Vicodin 1 tab  every six hours as needed.  Meclizine was changed to 12.5 mg twice a day.  We forced fluids.  The Wellbutrin was held.  She did endorse new onset of  auditory or visual hallucinations.  Was seeing people in her yard that no  one else could see.  Had some perceptual problems, feeling that the tiles on  the ceiling were changing.  The voice, she said, was like a little girl who  was about to die and saying I love you mom.  She started to settle down.  She endorsed that she was having the voices, seeing things, felt like she  was going to be hurt and that she had hurt some children, upset with having  the thoughts.  Family session with husband went well.  He felt that she had  improved a lot and, on April 28th, she was in full contact with reality.  There were no suicidal ideation, no homicidal ideation, no hallucinations,  no delusions.  [place] Spaniel that she had an episode of starting to hear the music  but she was able to bring herself back to reality, which was very  reassuring.  Endorsed difficulty with memory, forgetting phone numbers.  Otherwise, she felt much better, ready to be discharged to follow up on an  outpatient basis.   DISCHARGE DIAGNOSES:   AXIS I:  Bipolar disorder with psychotic features.   AXIS II:  No diagnosis.   AXIS III:  1.  Interstitial cystitis.  2.  Chronic bladder  pain.  3.  Vertigo.   AXIS IV:  Moderate.   AXIS V:  Global Assessment of Functioning upon discharge 50-55.   DISCHARGE MEDICATIONS:  1.  Elavil 25 mg, 3 at night.  2.  Tenormin 100 mg, 1 at night.  3.  Antivert 12.5 mg, 1 twice a day.  4.  Zyprexa 2.5 mg at bedtime.   FOLLOW UP:  Dr. Lolly Mustache.       IL/MEDQ  D:  12/23/2004  T:  12/23/2004  Job:  [id number]

## 2010-12-18 NOTE — Consult Note (Signed)
NAMETAHEERA, THOMANN            ACCOUNT NO.:  [id number]   MEDICAL RECORD NO.:  [id number]          PATIENT TYPE:  INP   LOCATION:  0353                         FACILITY:  [facility]   PHYSICIAN:  Armanda Magic, M.D.     DATE OF BIRTH:  03/29/1941   DATE OF CONSULTATION:  12/20/2004  DATE OF DISCHARGE:                                   CONSULTATION   REFERRING PHYSICIAN:  Dr. Trula Slade   CHIEF COMPLAINT:  Chest pain.   This is a 70 year old white female with no previous cardiac history except  for a cardiac catheterization 15 years ago which apparently showed normal  coronary arteries per the patient.  She was admitted yesterday with  substernal chest pain.   The patient is somewhat of a difficult historian.  Apparently she had  admitted herself to our hospital secondary to acute psychosis.  She says she  was seeing people in the house who were not there and sometimes people would  be in the house and she did not know how they got there.  She was admitted  to the emergency room on May 21 with complaints of sharp stabbing chest pain  8/10 with radiation to left arm starting about 3 a.m. and lasting about 30  minutes.  There was no associated shortness of breath, nausea, vomiting, or  diaphoresis.  Of note, the patient did tell me initially that the pain  started while she was sitting and had her cat on her lap and she had not  gone to bed.  In the emergency room an EKG showed poor R-wave progression in  the precordial leads, but no acute ST changes.  Pain resolved on admission  to the emergency room.  Apparently, she had a heart catheterization 15 years  ago which was within normal limits.  Apparently, the patient has been having  acute hallucinations and is on no antipsychotic medications at the current  time.  She is very hard to get an accurate history from.  She states now  that she has had sharp jabbing pain this morning, again radiating to left  arm, but states she also has  been having a lot of pain with her interstitial  cystitis and seems to get confused as to which pain she is actively feeling  at this time.   PAST MEDICAL HISTORY:  1.  Hypertension.  2.  Chronic pain secondary to interstitial cystitis.  3.  Vertigo.  4.  Depression.  5.  Acute psychosis.  6.  Headache.   PAST SURGICAL HISTORY:  1.  She has had surgery on her nose for cancer.  2.  Status post tubal ligation.  3.  She also had a stimulator placed in her back for pain with interstitial      cystitis.   SOCIAL HISTORY:  She denies any tobacco or alcohol use.  She is married and  has two daughters.   FAMILY HISTORY:  Her mother has coronary disease, is status post CABG.  This  was done at a late age in life.  Her father is healthy.  Siblings are  healthy.  REVIEW OF SYSTEMS:  Other than what is stated in the HPI includes diarrhea.   MEDICATIONS:  1.  Atenolol 25 mg a day.  2.  Wellbutrin.  3.  OxyContin.  4.  Elavil at bedtime.  5.  Multivitamin.   PHYSICAL EXAMINATION:  VITAL SIGNS:  Blood pressure 144/66, heart rate 64,  respirations 20.  GENERAL:  This is a well-developed, well-nourished in no acute distress.  HEENT:  Benign.  NECK:  Supple without lymphadenopathy.  Carotid upstrokes +2 bilaterally  with faint left carotid bruit.  LUNGS:  Clear to auscultation throughout.  HEART:  Regular rate and rhythm.  Normal murmurs, rubs, or gallops.  Normal  S1, S2.  ABDOMEN:  Soft, nontender, nondistended.  Normoactive bowel sounds.  No  hepatosplenomegaly.  EXTREMITIES:  No cyanosis, erythema, edema.  Good distal pulses.   LABORATORIES:  Sodium 141, potassium 3.9, chloride 105, bicarbonate 30, BUN  27, creatinine 0.9.  Platelet count 164, white cell count 5.4, hematocrit  35.4, hemoglobin 12.1.  TSH is within normal limits.  Point of care markers:  CPK-MBs were 10.8, 6.9, 5.5.  Troponin less than 0.05 x3.  Myoglobin was  greater than 500 on the initial one then 238 and 227.   CPK was 269, 242,  220.  MB 10, 9.8, 8.6.  Troponin 0.02 x3.  EKG shows normal sinus rhythm  with old septal infarct, no acute ST changes.   ASSESSMENT:  1.  Atypical chest pain.  The patient with cardiac risk factors including      her mother with coronary disease at a later age in life and      hypertension.  Enzymes are contradictory.  CPK, MB, and myoglobin are      elevated but troponins are all negative.  EKG shows no acute ST changes.  2.  Hypertension, stable.   PLAN:  Will proceed with a stress Cardiolite study secondary to atypical  chest pain and negative troponins and no acute ST changes on EKG.  Patient  is very difficult to evaluate secondary to symptoms which appear to change.  Will initially be focusing on her chest pain and then go to focusing on the  pain from her interstitial cystitis.  She has also been having problems with  acute psychosis and hallucinations.       TT/MEDQ  D:  12/21/2004  T:  12/21/2004  Job:  [id number]   cc:   Sharlet Salina, M.D.  [address]  [place]  [place] [address]  Fax: [phone]

## 2010-12-21 ENCOUNTER — Other Ambulatory Visit: Payer: Self-pay | Admitting: Family Medicine

## 2010-12-21 DIAGNOSIS — M545 Low back pain, unspecified: Secondary | ICD-10-CM

## 2010-12-23 ENCOUNTER — Ambulatory Visit
Admission: RE | Admit: 2010-12-23 | Discharge: 2010-12-23 | Disposition: A | Discharge status: Home / Self Care | Payer: Medicare Other | Source: Ambulatory Visit | Attending: Family Medicine | Admitting: Family Medicine

## 2010-12-23 DIAGNOSIS — M545 Low back pain: Secondary | ICD-10-CM

## 2011-01-15 ENCOUNTER — Emergency Department (HOSPITAL_COMMUNITY)
Admission: EM | Admit: 2011-01-15 | Discharge: 2011-01-15 | Disposition: A | Discharge status: Home / Self Care | Payer: Medicare Other | Attending: Emergency Medicine | Admitting: Emergency Medicine

## 2011-01-15 ENCOUNTER — Emergency Department (HOSPITAL_COMMUNITY): Payer: Medicare Other

## 2011-01-15 DIAGNOSIS — R109 Unspecified abdominal pain: Secondary | ICD-10-CM | POA: Insufficient documentation

## 2011-01-15 DIAGNOSIS — Z8679 Personal history of other diseases of the circulatory system: Secondary | ICD-10-CM | POA: Insufficient documentation

## 2011-01-15 DIAGNOSIS — Z043 Encounter for examination and observation following other accident: Secondary | ICD-10-CM | POA: Insufficient documentation

## 2011-01-15 DIAGNOSIS — F0390 Unspecified dementia without behavioral disturbance: Secondary | ICD-10-CM | POA: Insufficient documentation

## 2011-01-15 DIAGNOSIS — R404 Transient alteration of awareness: Secondary | ICD-10-CM | POA: Insufficient documentation

## 2011-01-15 DIAGNOSIS — I10 Essential (primary) hypertension: Secondary | ICD-10-CM | POA: Insufficient documentation

## 2011-01-15 DIAGNOSIS — Y92009 Unspecified place in unspecified non-institutional (private) residence as the place of occurrence of the external cause: Secondary | ICD-10-CM | POA: Insufficient documentation

## 2011-01-15 DIAGNOSIS — E039 Hypothyroidism, unspecified: Secondary | ICD-10-CM | POA: Insufficient documentation

## 2011-01-15 DIAGNOSIS — F319 Bipolar disorder, unspecified: Secondary | ICD-10-CM | POA: Insufficient documentation

## 2011-01-15 DIAGNOSIS — W19XXXA Unspecified fall, initial encounter: Secondary | ICD-10-CM | POA: Insufficient documentation

## 2011-01-15 DIAGNOSIS — N3010 Interstitial cystitis (chronic) without hematuria: Secondary | ICD-10-CM | POA: Insufficient documentation

## 2011-02-21 ENCOUNTER — Emergency Department (HOSPITAL_COMMUNITY): Payer: Medicare Other

## 2011-02-21 ENCOUNTER — Emergency Department (HOSPITAL_COMMUNITY)
Admission: EM | Admit: 2011-02-21 | Discharge: 2011-02-21 | Disposition: A | Discharge status: Home / Self Care | Payer: Medicare Other | Attending: Emergency Medicine | Admitting: Emergency Medicine

## 2011-02-21 DIAGNOSIS — Z9119 Patient's noncompliance with other medical treatment and regimen: Secondary | ICD-10-CM | POA: Insufficient documentation

## 2011-02-21 DIAGNOSIS — Z91199 Patient's noncompliance with other medical treatment and regimen due to unspecified reason: Secondary | ICD-10-CM | POA: Insufficient documentation

## 2011-02-21 DIAGNOSIS — F29 Unspecified psychosis not due to a substance or known physiological condition: Secondary | ICD-10-CM | POA: Insufficient documentation

## 2011-02-21 DIAGNOSIS — I10 Essential (primary) hypertension: Secondary | ICD-10-CM | POA: Insufficient documentation

## 2011-02-21 DIAGNOSIS — R4182 Altered mental status, unspecified: Secondary | ICD-10-CM | POA: Insufficient documentation

## 2011-02-21 DIAGNOSIS — Z8673 Personal history of transient ischemic attack (TIA), and cerebral infarction without residual deficits: Secondary | ICD-10-CM | POA: Insufficient documentation

## 2011-02-21 DIAGNOSIS — F319 Bipolar disorder, unspecified: Secondary | ICD-10-CM | POA: Insufficient documentation

## 2011-02-21 DIAGNOSIS — E876 Hypokalemia: Secondary | ICD-10-CM | POA: Insufficient documentation

## 2011-02-21 LAB — URINALYSIS, ROUTINE W REFLEX MICROSCOPIC
Glucose, UA: 100 mg/dL — AB
Hgb urine dipstick: NEGATIVE
Protein, ur: NEGATIVE mg/dL

## 2011-02-21 LAB — DIFFERENTIAL
Basophils Absolute: 0 10*3/uL (ref 0.0–0.1)
Basophils Relative: 0 % (ref 0–1)
Eosinophils Absolute: 0 10*3/uL (ref 0.0–0.7)
Neutrophils Relative %: 74 % (ref 43–77)

## 2011-02-21 LAB — COMPREHENSIVE METABOLIC PANEL
ALT: 10 U/L (ref 0–35)
AST: 26 U/L (ref 0–37)
CO2: 26 mEq/L (ref 19–32)
Calcium: 9.3 mg/dL (ref 8.4–10.5)
Chloride: 105 mEq/L (ref 96–112)
GFR calc non Af Amer: >60 mL/min (ref >60)
Sodium: 142 mEq/L (ref 135–145)

## 2011-02-21 LAB — RAPID URINE DRUG SCREEN, HOSP PERFORMED
Amphetamines: NOT DETECTED
Benzodiazepines: NOT DETECTED
Cocaine: NOT DETECTED
Opiates: NOT DETECTED
Tetrahydrocannabinol: NOT DETECTED

## 2011-02-21 LAB — CBC
Platelets: 173 10*3/uL (ref 150–400)
RBC: 4.43 MIL/uL (ref 3.87–5.11)
WBC: 5.2 10*3/uL (ref 4.0–10.5)

## 2011-03-19 ENCOUNTER — Emergency Department (HOSPITAL_COMMUNITY): Payer: Medicare Other

## 2011-03-19 ENCOUNTER — Emergency Department (HOSPITAL_COMMUNITY)
Admission: EM | Admit: 2011-03-19 | Discharge: 2011-04-03 | Disposition: A | Discharge status: Home / Self Care | Payer: Medicare Other | Attending: Emergency Medicine | Admitting: Emergency Medicine

## 2011-03-19 DIAGNOSIS — S5000XA Contusion of unspecified elbow, initial encounter: Secondary | ICD-10-CM | POA: Insufficient documentation

## 2011-03-19 DIAGNOSIS — F29 Unspecified psychosis not due to a substance or known physiological condition: Secondary | ICD-10-CM | POA: Insufficient documentation

## 2011-03-19 DIAGNOSIS — F209 Schizophrenia, unspecified: Secondary | ICD-10-CM | POA: Insufficient documentation

## 2011-03-19 DIAGNOSIS — F319 Bipolar disorder, unspecified: Secondary | ICD-10-CM | POA: Insufficient documentation

## 2011-03-19 DIAGNOSIS — I10 Essential (primary) hypertension: Secondary | ICD-10-CM | POA: Insufficient documentation

## 2011-03-19 DIAGNOSIS — F0390 Unspecified dementia without behavioral disturbance: Secondary | ICD-10-CM | POA: Insufficient documentation

## 2011-03-19 DIAGNOSIS — X58XXXA Exposure to other specified factors, initial encounter: Secondary | ICD-10-CM | POA: Insufficient documentation

## 2011-03-19 DIAGNOSIS — R29898 Other symptoms and signs involving the musculoskeletal system: Secondary | ICD-10-CM | POA: Insufficient documentation

## 2011-03-19 DIAGNOSIS — I69998 Other sequelae following unspecified cerebrovascular disease: Secondary | ICD-10-CM | POA: Insufficient documentation

## 2011-03-19 DIAGNOSIS — E039 Hypothyroidism, unspecified: Secondary | ICD-10-CM | POA: Insufficient documentation

## 2011-03-19 DIAGNOSIS — S60229A Contusion of unspecified hand, initial encounter: Secondary | ICD-10-CM | POA: Insufficient documentation

## 2011-03-19 LAB — URINALYSIS, ROUTINE W REFLEX MICROSCOPIC
Bilirubin Urine: NEGATIVE
Hgb urine dipstick: NEGATIVE
Ketones, ur: NEGATIVE mg/dL
Protein, ur: NEGATIVE mg/dL
Urobilinogen, UA: 1 mg/dL (ref 0.0–1.0)

## 2011-03-19 LAB — COMPREHENSIVE METABOLIC PANEL
ALT: 6 U/L (ref 0–35)
Albumin: 4 g/dL (ref 3.5–5.2)
Alkaline Phosphatase: 69 U/L (ref 39–117)
Chloride: 103 mEq/L (ref 96–112)
Glucose, Bld: 117 mg/dL — ABNORMAL HIGH (ref 70–99)
Potassium: 3.7 mEq/L (ref 3.5–5.1)
Sodium: 140 mEq/L (ref 135–145)
Total Protein: 6.8 g/dL (ref 6.0–8.3)

## 2011-03-19 LAB — RAPID URINE DRUG SCREEN, HOSP PERFORMED
Amphetamines: NOT DETECTED
Tetrahydrocannabinol: NOT DETECTED

## 2011-03-19 LAB — CBC
MCH: 30.9 pg (ref 26.0–34.0)
MCHC: 32.6 g/dL (ref 30.0–36.0)
MCV: 94.9 fL (ref 78.0–100.0)
Platelets: 215 10*3/uL (ref 150–400)

## 2011-03-19 LAB — DIFFERENTIAL
Basophils Relative: 0 % (ref 0–1)
Eosinophils Absolute: 0.1 10*3/uL (ref 0.0–0.7)
Eosinophils Relative: 3 % (ref 0–5)
Monocytes Absolute: 0.3 10*3/uL (ref 0.1–1.0)
Monocytes Relative: 7 % (ref 3–12)

## 2011-03-21 ENCOUNTER — Emergency Department (HOSPITAL_COMMUNITY): Payer: Medicare Other

## 2011-03-22 LAB — DIFFERENTIAL
Lymphocytes Relative: 26 % (ref 12–46)
Lymphs Abs: 1 10*3/uL (ref 0.7–4.0)
Monocytes Absolute: 0.4 10*3/uL (ref 0.1–1.0)
Monocytes Relative: 11 % (ref 3–12)
Neutro Abs: 2.2 10*3/uL (ref 1.7–7.7)

## 2011-03-22 LAB — CBC
HCT: 35.7 % — ABNORMAL LOW (ref 36.0–46.0)
Hemoglobin: 11.4 g/dL — ABNORMAL LOW (ref 12.0–15.0)
MCH: 30.6 pg (ref 26.0–34.0)
MCHC: 31.9 g/dL (ref 30.0–36.0)
MCV: 95.7 fL (ref 78.0–100.0)

## 2011-03-22 LAB — COMPREHENSIVE METABOLIC PANEL
Alkaline Phosphatase: 63 U/L (ref 39–117)
BUN: 24 mg/dL — ABNORMAL HIGH (ref 6–23)
Calcium: 9.2 mg/dL (ref 8.4–10.5)
Glucose, Bld: 99 mg/dL (ref 70–99)
Potassium: 3.6 mEq/L (ref 3.5–5.1)
Total Protein: 6.2 g/dL (ref 6.0–8.3)

## 2011-03-22 LAB — URINALYSIS, ROUTINE W REFLEX MICROSCOPIC
Bilirubin Urine: NEGATIVE
Hgb urine dipstick: NEGATIVE
Specific Gravity, Urine: 1.025 (ref 1.005–1.030)
pH: 6 (ref 5.0–8.0)

## 2011-03-25 LAB — DIFFERENTIAL
Basophils Absolute: 0 10*3/uL (ref 0.0–0.1)
Eosinophils Relative: 2 % (ref 0–5)
Lymphocytes Relative: 16 % (ref 12–46)
Monocytes Absolute: 0.4 10*3/uL (ref 0.1–1.0)
Monocytes Relative: 8 % (ref 3–12)
Neutro Abs: 3.7 10*3/uL (ref 1.7–7.7)

## 2011-03-25 LAB — URINALYSIS, ROUTINE W REFLEX MICROSCOPIC
Glucose, UA: NEGATIVE mg/dL
Ketones, ur: NEGATIVE mg/dL
Leukocytes, UA: NEGATIVE
Nitrite: NEGATIVE
Specific Gravity, Urine: 1.028 (ref 1.005–1.030)
pH: 6 (ref 5.0–8.0)

## 2011-03-25 LAB — CBC
HCT: 34.9 % — ABNORMAL LOW (ref 36.0–46.0)
Hemoglobin: 11.2 g/dL — ABNORMAL LOW (ref 12.0–15.0)
MCH: 31.1 pg (ref 26.0–34.0)
MCHC: 32.1 g/dL (ref 30.0–36.0)
RDW: 14.9 % (ref 11.5–15.5)

## 2011-03-25 LAB — COMPREHENSIVE METABOLIC PANEL
Albumin: 3.3 g/dL — ABNORMAL LOW (ref 3.5–5.2)
BUN: 30 mg/dL — ABNORMAL HIGH (ref 6–23)
Calcium: 9.4 mg/dL (ref 8.4–10.5)
Creatinine, Ser: 0.66 mg/dL (ref 0.50–1.10)
GFR calc Af Amer: >60 mL/min (ref >60)
Glucose, Bld: 161 mg/dL — ABNORMAL HIGH (ref 70–99)
Total Protein: 6.2 g/dL (ref 6.0–8.3)

## 2011-03-30 LAB — COMPREHENSIVE METABOLIC PANEL
AST: 14 U/L (ref 0–37)
Albumin: 3.3 g/dL — ABNORMAL LOW (ref 3.5–5.2)
Alkaline Phosphatase: 69 U/L (ref 39–117)
BUN: 31 mg/dL — ABNORMAL HIGH (ref 6–23)
CO2: 28 mEq/L (ref 19–32)
Chloride: 104 mEq/L (ref 96–112)
Potassium: 3.9 mEq/L (ref 3.5–5.1)
Total Bilirubin: 0.3 mg/dL (ref 0.3–1.2)

## 2011-03-30 LAB — CBC
HCT: 35 % — ABNORMAL LOW (ref 36.0–46.0)
RBC: 3.67 MIL/uL — ABNORMAL LOW (ref 3.87–5.11)
RDW: 14.1 % (ref 11.5–15.5)
WBC: 4.4 10*3/uL (ref 4.0–10.5)

## 2011-03-30 LAB — URINALYSIS, ROUTINE W REFLEX MICROSCOPIC
Glucose, UA: NEGATIVE mg/dL
Hgb urine dipstick: NEGATIVE
Ketones, ur: NEGATIVE mg/dL
Protein, ur: NEGATIVE mg/dL

## 2011-03-30 LAB — DIFFERENTIAL
Basophils Absolute: 0 10*3/uL (ref 0.0–0.1)
Eosinophils Relative: 3 % (ref 0–5)
Lymphocytes Relative: 21 % (ref 12–46)
Neutro Abs: 3 10*3/uL (ref 1.7–7.7)
Neutrophils Relative %: 67 % (ref 43–77)

## 2011-04-21 ENCOUNTER — Emergency Department (HOSPITAL_COMMUNITY): Payer: Medicare Other

## 2011-04-21 ENCOUNTER — Emergency Department (HOSPITAL_COMMUNITY)
Admission: EM | Admit: 2011-04-21 | Discharge: 2011-04-22 | Disposition: A | Discharge status: Home / Self Care | Payer: Medicare Other | Attending: Emergency Medicine | Admitting: Emergency Medicine

## 2011-04-21 DIAGNOSIS — I10 Essential (primary) hypertension: Secondary | ICD-10-CM | POA: Insufficient documentation

## 2011-04-21 DIAGNOSIS — F319 Bipolar disorder, unspecified: Secondary | ICD-10-CM | POA: Insufficient documentation

## 2011-04-21 DIAGNOSIS — M1990 Unspecified osteoarthritis, unspecified site: Secondary | ICD-10-CM | POA: Insufficient documentation

## 2011-04-21 DIAGNOSIS — S5000XA Contusion of unspecified elbow, initial encounter: Secondary | ICD-10-CM | POA: Insufficient documentation

## 2011-04-21 DIAGNOSIS — Z79899 Other long term (current) drug therapy: Secondary | ICD-10-CM | POA: Insufficient documentation

## 2011-04-21 DIAGNOSIS — Z8659 Personal history of other mental and behavioral disorders: Secondary | ICD-10-CM | POA: Insufficient documentation

## 2011-04-21 DIAGNOSIS — E039 Hypothyroidism, unspecified: Secondary | ICD-10-CM | POA: Insufficient documentation

## 2011-04-21 DIAGNOSIS — E119 Type 2 diabetes mellitus without complications: Secondary | ICD-10-CM | POA: Insufficient documentation

## 2011-04-21 DIAGNOSIS — S51009A Unspecified open wound of unspecified elbow, initial encounter: Secondary | ICD-10-CM | POA: Insufficient documentation

## 2011-04-21 DIAGNOSIS — E785 Hyperlipidemia, unspecified: Secondary | ICD-10-CM | POA: Insufficient documentation

## 2011-04-21 DIAGNOSIS — W19XXXA Unspecified fall, initial encounter: Secondary | ICD-10-CM | POA: Insufficient documentation

## 2011-04-21 DIAGNOSIS — F068 Other specified mental disorders due to known physiological condition: Secondary | ICD-10-CM | POA: Insufficient documentation

## 2011-04-21 DIAGNOSIS — Z8673 Personal history of transient ischemic attack (TIA), and cerebral infarction without residual deficits: Secondary | ICD-10-CM | POA: Insufficient documentation

## 2011-04-22 ENCOUNTER — Emergency Department (HOSPITAL_COMMUNITY)
Admission: EM | Admit: 2011-04-22 | Discharge: 2011-04-22 | Disposition: A | Discharge status: Home / Self Care | Payer: Medicare Other | Attending: Emergency Medicine | Admitting: Emergency Medicine

## 2011-04-22 ENCOUNTER — Emergency Department (HOSPITAL_COMMUNITY): Payer: Medicare Other

## 2011-04-22 DIAGNOSIS — S42213A Unspecified displaced fracture of surgical neck of unspecified humerus, initial encounter for closed fracture: Secondary | ICD-10-CM | POA: Insufficient documentation

## 2011-04-22 DIAGNOSIS — Z8673 Personal history of transient ischemic attack (TIA), and cerebral infarction without residual deficits: Secondary | ICD-10-CM | POA: Insufficient documentation

## 2011-04-22 DIAGNOSIS — M25519 Pain in unspecified shoulder: Secondary | ICD-10-CM | POA: Insufficient documentation

## 2011-04-22 DIAGNOSIS — Z8659 Personal history of other mental and behavioral disorders: Secondary | ICD-10-CM | POA: Insufficient documentation

## 2011-04-22 DIAGNOSIS — Y9210 Unspecified residential institution as the place of occurrence of the external cause: Secondary | ICD-10-CM | POA: Insufficient documentation

## 2011-04-22 DIAGNOSIS — S51009A Unspecified open wound of unspecified elbow, initial encounter: Secondary | ICD-10-CM | POA: Insufficient documentation

## 2011-04-22 DIAGNOSIS — Z79899 Other long term (current) drug therapy: Secondary | ICD-10-CM | POA: Insufficient documentation

## 2011-04-22 DIAGNOSIS — S4980XA Other specified injuries of shoulder and upper arm, unspecified arm, initial encounter: Secondary | ICD-10-CM | POA: Insufficient documentation

## 2011-04-22 DIAGNOSIS — W19XXXA Unspecified fall, initial encounter: Secondary | ICD-10-CM | POA: Insufficient documentation

## 2011-04-22 DIAGNOSIS — F068 Other specified mental disorders due to known physiological condition: Secondary | ICD-10-CM | POA: Insufficient documentation

## 2011-04-22 DIAGNOSIS — F319 Bipolar disorder, unspecified: Secondary | ICD-10-CM | POA: Insufficient documentation

## 2011-04-22 DIAGNOSIS — S46909A Unspecified injury of unspecified muscle, fascia and tendon at shoulder and upper arm level, unspecified arm, initial encounter: Secondary | ICD-10-CM | POA: Insufficient documentation

## 2011-04-22 DIAGNOSIS — M25419 Effusion, unspecified shoulder: Secondary | ICD-10-CM | POA: Insufficient documentation

## 2011-05-05 LAB — URINALYSIS, ROUTINE W REFLEX MICROSCOPIC
Glucose, UA: NEGATIVE
Hgb urine dipstick: NEGATIVE
Ketones, ur: 40 — AB
Protein, ur: 30 — AB

## 2011-05-05 LAB — CBC
HCT: 40
Hemoglobin: 13.4
RBC: 4.16
WBC: 8.9

## 2011-05-05 LAB — DIFFERENTIAL
Basophils Relative: 0
Eosinophils Absolute: 0
Eosinophils Relative: 0
Monocytes Relative: 11
Neutrophils Relative %: 76

## 2011-05-05 LAB — RAPID URINE DRUG SCREEN, HOSP PERFORMED
Barbiturates: NOT DETECTED
Benzodiazepines: NOT DETECTED
Cocaine: NOT DETECTED
Opiates: NOT DETECTED

## 2011-05-05 LAB — COMPREHENSIVE METABOLIC PANEL
ALT: 22
AST: 59 — ABNORMAL HIGH
Alkaline Phosphatase: 76
CO2: 29
GFR calc non Af Amer: >60
Glucose, Bld: 114 — ABNORMAL HIGH
Potassium: 3.6
Sodium: 143

## 2011-05-05 LAB — ETHANOL: Alcohol, Ethyl (B): <5

## 2011-05-05 LAB — GLUCOSE, CAPILLARY

## 2011-05-10 ENCOUNTER — Emergency Department (HOSPITAL_COMMUNITY)
Admission: EM | Admit: 2011-05-10 | Discharge: 2011-05-11 | Disposition: A | Discharge status: Home / Self Care | Payer: Medicare Other | Attending: Emergency Medicine | Admitting: Emergency Medicine

## 2011-05-10 DIAGNOSIS — F0390 Unspecified dementia without behavioral disturbance: Secondary | ICD-10-CM | POA: Insufficient documentation

## 2011-05-10 DIAGNOSIS — Z79899 Other long term (current) drug therapy: Secondary | ICD-10-CM | POA: Insufficient documentation

## 2011-05-10 DIAGNOSIS — W19XXXA Unspecified fall, initial encounter: Secondary | ICD-10-CM | POA: Insufficient documentation

## 2011-05-10 DIAGNOSIS — Y9210 Unspecified residential institution as the place of occurrence of the external cause: Secondary | ICD-10-CM | POA: Insufficient documentation

## 2011-05-10 DIAGNOSIS — M79609 Pain in unspecified limb: Secondary | ICD-10-CM | POA: Insufficient documentation

## 2011-05-10 DIAGNOSIS — Z8659 Personal history of other mental and behavioral disorders: Secondary | ICD-10-CM | POA: Insufficient documentation

## 2011-05-10 DIAGNOSIS — F319 Bipolar disorder, unspecified: Secondary | ICD-10-CM | POA: Insufficient documentation

## 2011-05-10 DIAGNOSIS — Z8673 Personal history of transient ischemic attack (TIA), and cerebral infarction without residual deficits: Secondary | ICD-10-CM | POA: Insufficient documentation

## 2011-05-10 LAB — URINALYSIS, ROUTINE W REFLEX MICROSCOPIC
Bilirubin Urine: NEGATIVE
Glucose, UA: NEGATIVE mg/dL
Hgb urine dipstick: NEGATIVE
Specific Gravity, Urine: 1.018 (ref 1.005–1.030)
Urobilinogen, UA: 1 mg/dL (ref 0.0–1.0)

## 2011-05-10 LAB — GLUCOSE, CAPILLARY: Glucose-Capillary: 144 mg/dL — ABNORMAL HIGH (ref 70–99)

## 2011-05-11 LAB — URINE CULTURE
Colony Count: NO GROWTH
Culture  Setup Time: 201210082220
Culture: NO GROWTH

## 2011-05-26 ENCOUNTER — Emergency Department (HOSPITAL_COMMUNITY): Payer: Medicare Other

## 2011-05-26 ENCOUNTER — Encounter (HOSPITAL_COMMUNITY): Payer: Self-pay | Admitting: Radiology

## 2011-05-26 ENCOUNTER — Inpatient Hospital Stay (HOSPITAL_COMMUNITY)
Admission: EM | Admit: 2011-05-26 | Discharge: 2011-06-01 | DRG: 312 | Disposition: A | Discharge status: Skilled Nursing Facility | Payer: Medicare Other | Attending: Internal Medicine | Admitting: Internal Medicine

## 2011-05-26 DIAGNOSIS — E785 Hyperlipidemia, unspecified: Secondary | ICD-10-CM | POA: Diagnosis present

## 2011-05-26 DIAGNOSIS — E46 Unspecified protein-calorie malnutrition: Secondary | ICD-10-CM | POA: Diagnosis present

## 2011-05-26 DIAGNOSIS — F319 Bipolar disorder, unspecified: Secondary | ICD-10-CM | POA: Diagnosis present

## 2011-05-26 DIAGNOSIS — Z66 Do not resuscitate: Secondary | ICD-10-CM | POA: Diagnosis present

## 2011-05-26 DIAGNOSIS — F0390 Unspecified dementia without behavioral disturbance: Secondary | ICD-10-CM | POA: Diagnosis present

## 2011-05-26 DIAGNOSIS — I10 Essential (primary) hypertension: Secondary | ICD-10-CM | POA: Diagnosis present

## 2011-05-26 DIAGNOSIS — E7800 Pure hypercholesterolemia, unspecified: Secondary | ICD-10-CM | POA: Diagnosis present

## 2011-05-26 DIAGNOSIS — F209 Schizophrenia, unspecified: Secondary | ICD-10-CM | POA: Diagnosis present

## 2011-05-26 DIAGNOSIS — E039 Hypothyroidism, unspecified: Secondary | ICD-10-CM | POA: Diagnosis present

## 2011-05-26 DIAGNOSIS — S42309D Unspecified fracture of shaft of humerus, unspecified arm, subsequent encounter for fracture with routine healing: Secondary | ICD-10-CM

## 2011-05-26 DIAGNOSIS — M1990 Unspecified osteoarthritis, unspecified site: Secondary | ICD-10-CM | POA: Diagnosis present

## 2011-05-26 DIAGNOSIS — I69959 Hemiplegia and hemiparesis following unspecified cerebrovascular disease affecting unspecified side: Secondary | ICD-10-CM

## 2011-05-26 DIAGNOSIS — R55 Syncope and collapse: Principal | ICD-10-CM | POA: Diagnosis present

## 2011-05-26 DIAGNOSIS — Z7982 Long term (current) use of aspirin: Secondary | ICD-10-CM

## 2011-05-26 DIAGNOSIS — Z79899 Other long term (current) drug therapy: Secondary | ICD-10-CM

## 2011-05-26 HISTORY — DX: Schizoaffective disorder, bipolar type: F25.0

## 2011-05-26 HISTORY — DX: Cerebral infarction, unspecified: I63.9

## 2011-05-26 HISTORY — DX: Schizoaffective disorder, unspecified: F25.9

## 2011-05-26 HISTORY — DX: Unspecified dementia, unspecified severity, without behavioral disturbance, psychotic disturbance, mood disturbance, and anxiety: F03.90

## 2011-05-26 LAB — COMPREHENSIVE METABOLIC PANEL
Albumin: 3.6 g/dL (ref 3.5–5.2)
Alkaline Phosphatase: 102 U/L (ref 39–117)
BUN: 25 mg/dL — ABNORMAL HIGH (ref 6–23)
Creatinine, Ser: 0.69 mg/dL (ref 0.50–1.10)
GFR calc Af Amer: >90 mL/min (ref >90)
Glucose, Bld: 138 mg/dL — ABNORMAL HIGH (ref 70–99)
Total Protein: 6.3 g/dL (ref 6.0–8.3)

## 2011-05-26 LAB — GLUCOSE, CAPILLARY: Glucose-Capillary: 112 mg/dL — ABNORMAL HIGH (ref 70–99)

## 2011-05-26 LAB — CBC
HCT: 34.7 % — ABNORMAL LOW (ref 36.0–46.0)
Hemoglobin: 11.5 g/dL — ABNORMAL LOW (ref 12.0–15.0)
MCH: 31.3 pg (ref 26.0–34.0)
MCHC: 33.1 g/dL (ref 30.0–36.0)
MCV: 94.3 fL (ref 78.0–100.0)
RDW: 13.1 % (ref 11.5–15.5)

## 2011-05-26 LAB — DIFFERENTIAL
Eosinophils Relative: 2 % (ref 0–5)
Lymphocytes Relative: 16 % (ref 12–46)
Lymphs Abs: 1 10*3/uL (ref 0.7–4.0)
Monocytes Absolute: 0.4 10*3/uL (ref 0.1–1.0)
Monocytes Relative: 6 % (ref 3–12)
Neutro Abs: 4.4 10*3/uL (ref 1.7–7.7)

## 2011-05-26 LAB — POCT I-STAT TROPONIN I: Troponin i, poc: 0.01 ng/mL (ref 0.00–0.08)

## 2011-05-27 ENCOUNTER — Inpatient Hospital Stay (HOSPITAL_COMMUNITY): Payer: Medicare Other

## 2011-05-27 ENCOUNTER — Other Ambulatory Visit (HOSPITAL_COMMUNITY): Payer: Medicare Other

## 2011-05-27 DIAGNOSIS — I6350 Cerebral infarction due to unspecified occlusion or stenosis of unspecified cerebral artery: Secondary | ICD-10-CM

## 2011-05-27 LAB — TSH: TSH: 4.271 u[IU]/mL (ref 0.350–4.500)

## 2011-05-27 LAB — CBC
MCH: 31 pg (ref 26.0–34.0)
MCHC: 33 g/dL (ref 30.0–36.0)
MCV: 94.1 fL (ref 78.0–100.0)
Platelets: 151 10*3/uL (ref 150–400)
RBC: 3.74 MIL/uL — ABNORMAL LOW (ref 3.87–5.11)

## 2011-05-27 LAB — APTT: aPTT: 28 seconds (ref 24–37)

## 2011-05-27 LAB — COMPREHENSIVE METABOLIC PANEL
ALT: 5 U/L (ref 0–35)
AST: 14 U/L (ref 0–37)
CO2: 28 mEq/L (ref 19–32)
Calcium: 9.8 mg/dL (ref 8.4–10.5)
Creatinine, Ser: 0.57 mg/dL (ref 0.50–1.10)
GFR calc Af Amer: >90 mL/min (ref >90)
GFR calc non Af Amer: >90 mL/min (ref >90)
Glucose, Bld: 94 mg/dL (ref 70–99)
Sodium: 143 mEq/L (ref 135–145)
Total Protein: 6.5 g/dL (ref 6.0–8.3)

## 2011-05-27 LAB — LIPID PANEL
HDL: 65 mg/dL (ref >39)
Total CHOL/HDL Ratio: 2.5 RATIO
Triglycerides: 50 mg/dL (ref <150)

## 2011-05-27 LAB — HEMOGLOBIN A1C: Hgb A1c MFr Bld: 5.9 % — ABNORMAL HIGH (ref <5.7)

## 2011-05-27 LAB — CK TOTAL AND CKMB (NOT AT ARMC): Total CK: 53 U/L (ref 7–177)

## 2011-05-27 LAB — URINALYSIS, ROUTINE W REFLEX MICROSCOPIC
Glucose, UA: NEGATIVE mg/dL
Hgb urine dipstick: NEGATIVE
Ketones, ur: NEGATIVE mg/dL
Protein, ur: NEGATIVE mg/dL
Urobilinogen, UA: 0.2 mg/dL (ref 0.0–1.0)

## 2011-05-27 LAB — T3, FREE: T3, Free: 3 pg/mL (ref 2.3–4.2)

## 2011-05-27 LAB — URINE MICROSCOPIC-ADD ON

## 2011-05-27 LAB — PROTIME-INR: INR: 0.91 (ref 0.00–1.49)

## 2011-05-27 LAB — CARDIAC PANEL(CRET KIN+CKTOT+MB+TROPI)
Relative Index: INVALID (ref 0.0–2.5)
Troponin I: <0.3 ng/mL (ref <0.30)

## 2011-05-27 LAB — DIFFERENTIAL
Basophils Relative: 0 % (ref 0–1)
Eosinophils Absolute: 0.1 10*3/uL (ref 0.0–0.7)
Lymphs Abs: 1.2 10*3/uL (ref 0.7–4.0)
Monocytes Absolute: 0.3 10*3/uL (ref 0.1–1.0)
Monocytes Relative: 7 % (ref 3–12)

## 2011-05-27 LAB — MRSA PCR SCREENING: MRSA by PCR: NEGATIVE

## 2011-05-27 NOTE — H&P (Signed)
NAME:  Kellie King, [name] NO.:  [id number]  MEDICAL RECORD NO.:  [id number]  LOCATION:  [facility]                         FACILITY:  [facility]  PHYSICIAN:  Talmage Nap, MD  DATE OF BIRTH:  1941-06-27  DATE OF ADMISSION:  05/27/2011 DATE OF DISCHARGE:                             HISTORY & PHYSICAL   PRIMARY CARE PHYSICIAN:  Stacie Acres. White, MD  History mainly obtainable from ED physician, ED note, partly from patient (the patient is a very poor historian).  No family member available to give any adequate history.  CHIEF COMPLAINT:  New onset of right facial droop, questionable alteration in mentation.  The patient is a 70 year old Caucasian female with prior history of CVA with left hemiparesis and bipolar affective disorder, was said to have been brought to the emergency room by family members because patient was observed to have right facial droop and with questionable loss of consciousness or alteration in mentation.  There was however no history of fever.  There was no history of chills, no history of rigor, no chest pain or shortness of breath.  At the time the patient was seen by me, she was oriented to person and place.  Her only complaint was that she had a fall couple of weeks back and was experiencing pain in the left upper arm.  She however denied any headaches, she denied any blurred vision.  She denied any nausea and vomiting.  She denied any fever, chills, or rigor.  She was demanding to be taken back to her family.  No family member was available to give any adequate history.  Her family members were said to have been worried about the new onset of CVA and subsequently brought to the emergency room.  The patient was evaluated by the neurologist who was of the opinion that patient has passed the window period for anti-thrombolytics.  The patient is however been admitted for further evaluation, I was also informed by the ED physician that patient is  a DNR.  PAST MEDICAL HISTORY:  Positive for: 1. Prior CVA with left hemiparesis. 2. Hypertension. 3. Hypothyroidism. 4. Bipolar affective disorder. 5. Schizophrenia. 6. Dementia. 7. Hyperlipidemia. 8. Osteoarthritis. 9. Protein-energy malnutrition.  She has no known past surgical history.  Preadmission meds without dosages include: 1. Ativan. 2. Bupropion/HCl. 3. Effexor. 4. Levothyroxine. 5. Lisinopril. 6. Macrobid. 7. Norvasc. 8. Pravastatin. 9. Risperidone. 10.Vicodin.  There is no documented allergies however going through the old notes, there is questionable allergy to TENORMIN, PENICILLIN, and ARICEPT.  SOCIAL HISTORY:  Negative for alcohol, tobacco use.  The patient is said to be a resident of the nursing home.  FAMILY HISTORY:  Negative for diabetes mellitus, CVA, or cardiovascular disease.  REVIEW OF SYSTEMS:  The patient presently denies any history of headaches.  No blurred vision.  No nausea or vomiting.  No fever, no chills, no rigor.  No chest pain, no shortness of breath, no cough.  No abdominal discomfort.  No diarrhea or hematochezia.  No dysuria or hematuria.  No swelling of the lower extremity.  No intolerance to heat or cold.  She however complained about pain in the left upper extremity with difficulty in lifting her left  arm.  PHYSICAL EXAMINATION:  GENERAL:  An elderly lady, slow mentation, not in any respiratory distress, dehydrated, oriented to person and place. VITAL SIGNS:  Her current vital signs, blood pressure is 116/57, pulse is 104, respiratory rate 16, temperature is 97.8. HEENT:  Pupils are reactive to light and extraocular muscles are intact. NECK:  No jugular venous distention.  No carotid bruit.  No lymphadenopathy. CHEST:  Clear to auscultation. HEART:  Heart sounds are 1 and 2. ABDOMEN:  Soft, nontender.  Liver, spleen, kidney not palpable.  Bowel sounds are positive. EXTREMITIES:  No pedal edema. NEUROLOGIC:  Shows slight  left hemiparesis. MUSCULOSKELETAL SYSTEM:  Show arthritic changes in the knees and in the feet with decreased range of movement, left upper extremity. SKIN:  Showed decreased turgor.  LABORATORY DATA:  Cardiac marker, troponin-I 0.00 and then on repeat it was 0.01.  Complete blood count with differential showed WBC of 5.9, hemoglobin 11.5, hematocrit of 34.7, MCV of 94.3, and a platelet count of 155 with normal differentials.  Comprehensive metabolic panel showed sodium of 141, potassium of 4.2, chloride of 104 with a bicarb of 30, glucose is 128, BUN is 25, creatinine 0.69.  LFTs are normal. Urinalysis showed small leukocyte esterase.  Urine microscopy showed wbc's 3 to 6 with rare bacteria.  EKG showed normal sinus rhythm with a rate of 90 with nonspecific T-wave changes in the inferior leads.  Imaging studies done include chest x-ray which showed no active cardiopulmonary disease.  CT of the head without contrast showed chronic ischemic changes, no acute abnormalities seen, and x-ray of the left clavicle done on April 22, 2011, showed comminuted intra-articular proximal left humeral fracture.  ADMITTING IMPRESSION: 1. Cerebrovascular accident questionable new onset. 2. Prior history of cerebrovascular accident with left hemiparesis. 3. Comminuted intra-articular proximal left humeral fracture. 4. Bipolar affective disorder. 5. Dementia. 6. Schizophrenia. 7. Hypothyroidism. 8. Hypertension. 9. Hyperlipidemia.  Plan is to admit patient to neuro telemetry 3000.  The patient will be slowly rehydrated with normal saline IV to go at rate of 65 mL an hour. She will be given aspirin 81 mg p.o. daily and Synthroid 50 mcg p.o. daily.  Other medication to be given to patient will include: 1. Lisinopril 5 mg p.o. daily. 2. Pravastatin 20 mg p.o. daily. 3. Norvasc 10 mg p.o. daily. 4. Risperdal 2 mg p.o. daily. 5. Effexor 37.5 mg p.o. daily. 6. GI prophylaxis will be done with  Protonix 40 mg IV q.24. 7. DVT prophylaxis with Lovenox 40 mg subcu q.24 hours.  Further workup to be done on this patient will include strictly stroke admission order sheet that was filled out by me.  In addition, the patient will have TSH, T3, and T4 done, and cardiac enzymes q.6 x3. Imaging study to be done will include MRI and MRA of the brain, a 2D echo, and carotid duplex.  I had an extensive discussion with the Neurology on-call and he recommended the patient should have imaging study done.  Finally, Orthopedic surgery will be consulted in a.m. for the left femoral fracture.  The patient will be followed and evaluated on day-to-day basis.     Talmage Nap, MD     CN/MEDQ  D:  05/27/2011  T:  05/27/2011  Job:  [id number]  Electronically Signed by Talmage Nap  on 05/27/2011 06:40:28 PM

## 2011-05-31 LAB — GLUCOSE, CAPILLARY: Glucose-Capillary: 96 mg/dL (ref 70–99)

## 2011-06-02 NOTE — Consult Note (Signed)
NAMENINAH, MOCCIO NO.:  [id number]  MEDICAL RECORD NO.:  [id number]  LOCATION:  3019                         FACILITY:  [facility]  PHYSICIAN:  Joycelyn Schmid, MD   DATE OF BIRTH:  06-12-1941  DATE OF CONSULTATION:  05/27/2011 DATE OF DISCHARGE:                                CONSULTATION   CHIEF COMPLAINT:  Possible stroke.  HISTORY OF PRESENT ILLNESS:  A 70 year old female with history of dementia, schizophrenia, bipolar disorder, hypothyroidism, prior right pontine ischemic infarction, residual left-sided weakness, DNR/DNI status, who was in normal state of health at nursing home until 5:15 p.m., when she was found to be slumped over with possible syncope.  When she woke up, she was noted to have increased left-sided weakness as compared to previous.  The patient was brought to emergency room for further evaluation, initially worked up as a syncope diagnosis. Approximately 4-1/2 hours after onset of  symptoms, the patient's family noted that left-sided symptoms were new and last seen normal was at 5:15 p.m.  Due to patient's history of prior stroke, unclear status, whether these symptoms were new or old, and  beyond window of time for tPA, the patient's code stroke was canceled because of being out of the window.  PAST MEDICAL HISTORY:  Dementia, schizophrenia, bipolar disorder, hypothyroidism, hypertension, and hypercholesterolemia.  MEDICATIONS:  Risperidone, hydrocodone, Tylenol, Remeron , pravastatin, levothyroxine, meloxicam, lisinopril, amlodipine, lorazepam, bupropion, and venlafaxine.  ALLERGIES:  No known drug allergies.  FAMILY HISTORY:  Unknown.  SOCIAL HISTORY:  She lives at [facility].  No tobacco, alcohol, illicit drug.  REVIEW OF SYSTEMS:  As per the HPI.  The patient denies any complaints except for left arm pain, probably from prior fracture.  Otherwise, review of system is negative.  PHYSICAL EXAMINATION:   VITAL SIGNS:  Temperature 97.8, blood pressure 116/57, heart rate 87, respiratory rate 21, and O2 saturation 96% on room air. GENERAL:  She is awake and alert.  She states her age is  70 years old. She states the month is February. NEUROLOGIC:  Language is fluent.  Compression intact.  Cranial nerves examination, pupils are reactive from 2-1 mm.  Extraocular muscles were intact.  Visual fields were full to confrontation.  No extinction. Facial sensation is symmetric, decreased right nasolabial fold.  Uvula is midline. Shoulder shrug symmetric.  Motor examination:  Normal bulk and tone in the right upper and right lower extremity.  Left upper extremity limited due to left upper extremity pain.  She has drift in the left arm, decreased left grip strength.  Left lower extremity falls immediately to  the bed with left upper and lower extremity weakness. Sensory examination intact to light touch and pinprick.  Cerebellar intact.  Finger-nose-finger in right upper extremity is normal.  Limited left lower extremity with weakness.  Reflexes, 1 in the right upper and right lower extremity, 2 in the  left upper and left lower extremity. Downgoing toes. CARDIOVASCULAR:  Regular rate and rhythm. No murmurs. No carotid bruits.  LABORATORY TESTING: Sodium 141, potassium 4.2, BUN 25, creatinine 0.69, glucose 138.  White count 5.9, platelets 165.  UA,small leukocyte esterase, 3-6 white blood cells, rare bacteria.  CT  scan of the head which I reviewed shows chronic right pontine paramedian ischemic infarction and right cerebellar ischemic infarction. No significant change from prior CT scan on March 19, 2011.  ASSESSMENT AND PLAN:  A 70 year old female with history of dementia, schizophrenia, bipolar disorder, hypothyroidism, hypertension, hypercholesterolemia, prior stroke, with residual left-sided face, arm and leg weakness, now with new episode of syncope and possible increased left-sided  weakness.  Differential diagnosis includes new stroke versus exacerbation of prior symptoms.  RECOMMENDATIONS:  MRI of the brain.  If stroke is confirmed, may consider further stroke workup, although given patient's comorbid diagnoses and DNR/DNI status, probably would not favor aggressive workup.  Continue supportive care.  Rule out infection or metabolic derangements.     Joycelyn Schmid, MD     VP/MEDQ  D:  05/27/2011  T:  05/27/2011  Job:  [id number]  Electronically Signed by Joycelyn Schmid  on 06/02/2011 09:54:38 AM

## 2011-06-05 NOTE — Discharge Summary (Signed)
NAMEUVA, RUNKEL NO.:  [id number]  MEDICAL RECORD NO.:  [id number]  LOCATION:  3030                         FACILITY:  [facility]  PHYSICIAN:  Erick Blinks, MD     DATE OF BIRTH:  [birth year]  DATE OF ADMISSION:  05/26/2011 DATE OF DISCHARGE:                        DISCHARGE SUMMARY - REFERRING   PRIMARY CARE PHYSICIAN:  Stacie Acres. White, MD  DISCHARGE DIAGNOSES: 1. Syncope, possibly vasovagal. 2. Prior cerebrovascular accident with residual left-sided     hemiparesis. 3. Hypertension. 4. Hypothyroidism. 5. Bipolar affective disorder. 6. Schizophrenia. 7. Dementia. 8. Hyperlipidemia. 9. Osteoarthritis. 10.Protein-calorie malnutrition. 11.Recent left humerus fracture, followed by [employer].  DISCHARGE MEDICATIONS: 1. Aspirin 81 mg p.o. daily. 2. Bupropion SR 100 mg p.o. b.i.d. 3. Risperdal 4 mg p.o. at bedtime. 4. Venlafaxine XR 75 mg p.o. b.i.d. 5. Amlodipine 2.5 mg p.o. daily. 6. Kenalog 0.1% 1 application topical b.i.d. p.r.n. 7. Hydrocodone/APAP 5/325 mg 1 tablet p.o. q.6 h. p.r.n. for pain. 8. Remeron 15 mg 1 tablet p.o. at bedtime. 9. Pravastatin 40 mg 1 tablet p.o. at bedtime. 10.Lisinopril 5 mg p.o. daily. 11.Levothyroxine 50 mcg p.o. daily.  ADMISSION HISTORY:  This is a 70 year old female who is a resident of [facility].  She was brought to the emergency room after she was found slumped over with questionable syncope.  There was some report that the patient may have some right-sided facial droop and had left- sided weakness.  She was brought to the emergency room and was subsequently admitted for further stroke workup. For details, please refer to the history and physical per Dr. Beverly Gust on May 27, 2011.  HOSPITAL COURSE: 1. Syncope.  The patient likely had a vasovagal episode.  She was     monitored on telemetry and did not have any acute findings.  Her     cardiac enzymes and EKG were unremarkable.  A 2-D echo  was ordered,     but the patient refused to have this.  She does not have any     symptoms at this time and appears to be at her baseline. 2. Left-sided weakness.  The patient has residual left-sided     hemiparesis from her prior CVA.  She was seen by Neurology.  Her CT     head was negative for any acute stroke.  MRI was not able to be     done due to the patient has an implanted bladder stimulator.  It     was the Neurology's impression that it was very doubtful that the     patient had an acute TIA or CVA, and continuing present treatment     was recommended.  The patient was also seen by speech therapy and     physical therapy and declined to work with either of them.  These     can be further pursued as an outpatient.  We anticipate that the     patient will need the same level of care that she would need 2 days     ago before she was admitted. 3. Left humerus fracture.  The patient was seen by [facility]     Orthopedics last week.  She was recommended to  keep her arm in a     sling.  This should be continued as an outpatient and she will need     to follow up with [facility] as previously arranged. 4. The remainder of the patient's medical issues have remained stable.  DISCHARGE INSTRUCTIONS:  She should continue on a heart-healthy diet. Conduct her activity as tolerated.  Follow up with her primary care physician in 1-2 weeks and follow up with [facility] as scheduled.  CONSULTATIONS:  Neurology, Dr. Pearlean Brownie.  DIAGNOSTIC IMAGING: 1. Chest x-ray from May 26, 2011, shows no active cardiopulmonary     disease and fracture of the proximal left humerus 2. CT head on May 26, 2011, shows chronic ischemic changes.  No     acute abnormality.  CONDITION AT THE TIME OF DISCHARGE:  Improved.     Erick Blinks, MD     JM/MEDQ  D:  05/28/2011  T:  05/28/2011  Job:  [id number]  cc:   Stacie Acres. Cliffton Asters, M.D.  Electronically Signed by Erick Blinks  on  06/05/2011 01:53:21 PM

## 2011-06-05 NOTE — Discharge Summary (Signed)
  NAMEMarland King  LATARRA, EAGLETON NO.:  [id number]  MEDICAL RECORD NO.:  [id number]  LOCATION:  3030                         FACILITY:  [facility]  PHYSICIAN:  Erick Blinks, MD     DATE OF BIRTH:  [birth year]  DATE OF ADMISSION:  05/26/2011 DATE OF DISCHARGE:                        DISCHARGE SUMMARY - REFERRING   ADDENDUM:  This is an addendum for discharge summary dictated on May 28, 2011.  Since last seen, there has been no change in the patient's hospital course.  She was evaluated by Physical Therapy and felt the patient could not return to her current assisted living facility as she will require skilled nursing care per Physical Therapy.  Her discharge diagnoses and her discharge medications remains unchanged.  She is stable for discharge.     Erick Blinks, MD     JM/MEDQ  D:  05/31/2011  T:  05/31/2011  Job:  [id number]  Electronically Signed by Durward Mallard MEMON  on 06/05/2011 01:55:36 PM

## 2011-07-16 ENCOUNTER — Emergency Department (HOSPITAL_COMMUNITY)
Admission: EM | Admit: 2011-07-16 | Discharge: 2011-07-17 | Disposition: A | Discharge status: Home / Self Care | Payer: Medicare Other | Attending: Emergency Medicine | Admitting: Emergency Medicine

## 2011-07-16 ENCOUNTER — Emergency Department (HOSPITAL_COMMUNITY): Payer: Medicare Other

## 2011-07-16 ENCOUNTER — Encounter (HOSPITAL_COMMUNITY): Payer: Self-pay | Admitting: Emergency Medicine

## 2011-07-16 DIAGNOSIS — Z79899 Other long term (current) drug therapy: Secondary | ICD-10-CM | POA: Insufficient documentation

## 2011-07-16 DIAGNOSIS — F068 Other specified mental disorders due to known physiological condition: Secondary | ICD-10-CM | POA: Insufficient documentation

## 2011-07-16 DIAGNOSIS — Z8673 Personal history of transient ischemic attack (TIA), and cerebral infarction without residual deficits: Secondary | ICD-10-CM | POA: Insufficient documentation

## 2011-07-16 DIAGNOSIS — W010XXA Fall on same level from slipping, tripping and stumbling without subsequent striking against object, initial encounter: Secondary | ICD-10-CM | POA: Insufficient documentation

## 2011-07-16 DIAGNOSIS — W19XXXA Unspecified fall, initial encounter: Secondary | ICD-10-CM

## 2011-07-16 DIAGNOSIS — Y9210 Unspecified residential institution as the place of occurrence of the external cause: Secondary | ICD-10-CM | POA: Insufficient documentation

## 2011-07-16 DIAGNOSIS — I10 Essential (primary) hypertension: Secondary | ICD-10-CM | POA: Insufficient documentation

## 2011-07-16 DIAGNOSIS — Z8659 Personal history of other mental and behavioral disorders: Secondary | ICD-10-CM | POA: Insufficient documentation

## 2011-07-16 HISTORY — DX: Disorder of thyroid, unspecified: E07.9

## 2011-07-16 HISTORY — DX: Essential (primary) hypertension: I10

## 2011-07-16 NOTE — ED Notes (Signed)
Pt ambulated short distance with EDP resident

## 2011-07-16 NOTE — ED Notes (Signed)
Per EMS pt from [facility]; pt was trying to open a door, and was having difficulty, stepped backwards, and tripped backwards and fell; pt denies pain, but NH reported to EMS that pt was favoring one side, but were unable to indicate which side

## 2011-07-16 NOTE — ED Notes (Signed)
[facility] called  

## 2011-07-16 NOTE — ED Provider Notes (Signed)
History     CSN: [id number] Arrival date & time: 07/16/2011  7:46 PM   First MD Initiated Contact with Patient 07/16/11 1952      Chief Complaint  Patient presents with  . Fall    (Consider location/radiation/quality/duration/timing/severity/associated sxs/prior treatment) The history is provided by the patient and the nursing home. History Limited By: dementia.   history is limited by patient's chronic dementia.  Patient is a 70 year old lady with history of dementia who presents after falling nursing facility. This happened just prior to arrival. Patient was trying to open cabinet and she tripped over a chair while walking backwards. She landed on her back but did not hit her. She denies pain anywhere. She was sent here for evaluation though because nursing facility staff noted that she was favoring one side (not sure which side) while she was walking after the fall. There is no change in mental status. There has been no recent fever, activity change, or other evidence of underlying illness. Overall severity is described as mild. No modifying factors noted.  Past Medical History  Diagnosis Date  . CVA (cerebral vascular accident)   . Dementia   . Schizo affective schizophrenia   . Hypertension   . Thyroid disease     No past surgical history on file.  No family history on file.  History  Substance Use Topics  . Smoking status: Not on file  . Smokeless tobacco: Not on file  . Alcohol Use:     OB History    Grav Para Term Preterm Abortions TAB SAB Ect Mult Living                  Review of Systems  Constitutional: Negative for fever, activity change and fatigue.  HENT: Negative for neck pain.   Respiratory: Negative for cough, chest tightness and shortness of breath.   Cardiovascular: Negative for chest pain.  Musculoskeletal: Negative for back pain.  Neurological: Negative for dizziness.  Psychiatric/Behavioral: Negative for behavioral problems and agitation.    All other systems reviewed and are negative.    Allergies  Review of patient's allergies indicates no known allergies.  Home Medications   Current Outpatient Rx  Name Route Sig Dispense Refill  . AMLODIPINE BESYLATE 2.5 MG PO TABS Oral Take 2.5 mg by mouth daily.      . BUPROPION HCL ER (SR) 100 MG PO TB12 Oral Take 100 mg by mouth 2 (two) times daily.      Marland Kitchen LEVOTHYROXINE SODIUM 50 MCG PO TABS Oral Take 50 mcg by mouth daily.      Marland Kitchen LISINOPRIL 5 MG PO TABS Oral Take 5 mg by mouth daily.      . MELOXICAM 15 MG PO TABS Oral Take 15 mg by mouth daily.      Marland Kitchen MIRTAZAPINE 15 MG PO TABS Oral Take 15 mg by mouth at bedtime.      Marland Kitchen PRAVASTATIN SODIUM 40 MG PO TABS Oral Take 40 mg by mouth daily.      Marland Kitchen RISPERIDONE 3 MG PO TABS Oral Take 3 mg by mouth daily.      . VENLAFAXINE HCL 75 MG PO CP24 Oral Take 75 mg by mouth daily.        BP 138/72  Pulse 77  Temp(Src) 97.3 F (36.3 C) (Oral)  Resp 18  SpO2 99%  Physical Exam  Nursing note and vitals reviewed. Constitutional: She appears well-developed. No distress.  HENT:  Head: Normocephalic and atraumatic.  Mouth/Throat:  Oropharynx is clear and moist.       No hematoma, abrasions, tenderness palpation  Eyes: EOM are normal. Pupils are equal, round, and reactive to light.  Neck: Normal range of motion. Neck supple. No tracheal deviation present.       No midline C spine TTP No step offs No visible injury  Cardiovascular: Normal rate and regular rhythm.   Pulmonary/Chest: Effort normal and breath sounds normal. No respiratory distress. She exhibits no tenderness.  Abdominal: Soft. She exhibits no distension. There is no tenderness.       No visible injury  Musculoskeletal: Normal range of motion. She exhibits no edema and no tenderness.       No midline T or L spine TTP No step offs  No tenderness to palpation over her extremities and no tenderness with range of motion. 2+ distal pulses throughout.  Able to ambulate without  difficulty or limping on exam  Neurological: She is alert. No cranial nerve deficit. Coordination normal.       Normal strength throughout  Mildly confused consistent with history of dementia.  Skin: Skin is warm and dry. She is not diaphoretic.  Psychiatric: She has a normal mood and affect. Her behavior is normal. Thought content normal.    ED Course  Procedures (including critical care time)  Labs Reviewed - No data to display Dg Hip Complete Left  07/16/2011  *RADIOLOGY REPORT*  Clinical Data: Fall  LEFT HIP - COMPLETE 2+ VIEW  Comparison: None.  Findings: Normal alignment and no fracture.  Mild degenerative change in the left hip joint.  Electrical stimulator pack is present overlying the left SI joint. The lead extends over to the region of the right sacrum.  IMPRESSION: Negative for fracture.  Original Report Authenticated By: Camelia Phenes, M.D.   Dg Knee Complete 4 Views Left  07/16/2011  *RADIOLOGY REPORT*  Clinical Data: Dimension, status post fall.  LEFT KNEE - COMPLETE 4+ VIEW  Comparison: None.  Findings: Suboptimal positioning. Within this limitation; Mild osteopenia.  Mild medial joint space and patellofemoral DJD.  No acute fracture or dislocation identified.  No definite joint effusion, no true lateral view obtained.  The  IMPRESSION: No displaced fracture or dislocation identified.  Radiographs are suboptimal due to positioning. If clinical concern for a fracture persists, recommend a repeat radiograph in 5-10 days to evaluate for interval change or callus formation.  Original Report Authenticated By: Waneta Martins, M.D.     1. Fall       MDM   Patient here after mechanical fall nursing facility. No evidence of preceding events or precipitating factors. She did not hit her head or have loss of consciousness. She has not had change in baseline mental status. She was still able to ambulate but was favoring one of her lower extremities. On her exam she has no  evidence of trauma to head.  She also is not reported to have hit head and is not on anticoagulants. Her entire spine is nontender to palpation and she has no pain with range of motion. On initial evaluation, patient was able to ambulate without difficulty. She was able to bear full weight on both lower extremities. Patient was reassessed a little later and noted to have mild limping with left leg. She denies significant pain. X-ray of knee and hip obtained and were negative. Clinical picture is consistent with mild contusion. No evidence of significant injury.        Milus Glazier 07/17/11 0050

## 2011-07-16 NOTE — ED Notes (Signed)
Got okay by EDP res, now that xrays are back, that pt is able to get d/c home

## 2011-07-20 NOTE — ED Provider Notes (Signed)
I saw and evaluated the patient, reviewed the resident's note and I agree with the findings and plan.  70yf presenting after fall. Demented and cannot provide much useful hx. No report of hitting head, LOC or blood thinning medications. Pt ambulatory.  XR neg. Pt being discharged back to skilled facility.  Raeford Razor, MD 07/20/11 2230

## 2011-08-09 ENCOUNTER — Encounter (HOSPITAL_COMMUNITY): Payer: Self-pay

## 2011-08-09 ENCOUNTER — Emergency Department (HOSPITAL_COMMUNITY): Payer: Medicare Other

## 2011-08-09 ENCOUNTER — Emergency Department (HOSPITAL_COMMUNITY)
Admission: EM | Admit: 2011-08-09 | Discharge: 2011-08-09 | Disposition: A | Discharge status: Skilled Nursing Facility | Payer: Medicare Other | Attending: Emergency Medicine | Admitting: Emergency Medicine

## 2011-08-09 DIAGNOSIS — M899 Disorder of bone, unspecified: Secondary | ICD-10-CM | POA: Insufficient documentation

## 2011-08-09 DIAGNOSIS — S5000XA Contusion of unspecified elbow, initial encounter: Secondary | ICD-10-CM | POA: Insufficient documentation

## 2011-08-09 DIAGNOSIS — W06XXXA Fall from bed, initial encounter: Secondary | ICD-10-CM | POA: Insufficient documentation

## 2011-08-09 DIAGNOSIS — Y9210 Unspecified residential institution as the place of occurrence of the external cause: Secondary | ICD-10-CM | POA: Insufficient documentation

## 2011-08-09 DIAGNOSIS — I10 Essential (primary) hypertension: Secondary | ICD-10-CM | POA: Insufficient documentation

## 2011-08-09 DIAGNOSIS — Z8673 Personal history of transient ischemic attack (TIA), and cerebral infarction without residual deficits: Secondary | ICD-10-CM | POA: Insufficient documentation

## 2011-08-09 DIAGNOSIS — F0390 Unspecified dementia without behavioral disturbance: Secondary | ICD-10-CM | POA: Insufficient documentation

## 2011-08-09 DIAGNOSIS — M949 Disorder of cartilage, unspecified: Secondary | ICD-10-CM | POA: Insufficient documentation

## 2011-08-09 DIAGNOSIS — S5002XA Contusion of left elbow, initial encounter: Secondary | ICD-10-CM

## 2011-08-09 NOTE — ED Notes (Signed)
WJX:[id number]<WG> Expected date:08/09/11<BR> Expected time: 3:24 PM<BR> Means of arrival:Ambulance<BR> Comments:<BR> EMS 40 GC - hypotensive/n/v/d 64 systolic

## 2011-08-09 NOTE — ED Provider Notes (Addendum)
History     CSN: [id number]  Arrival date & time 08/09/11  1508   First MD Initiated Contact with Patient 08/09/11 1529      Chief Complaint  Patient presents with  . Fall  . Elbow Injury   Level V Caveat (Consider location/radiation/quality/duration/timing/severity/associated sxs/prior treatment) HPI Kellie King is a 71 y.o. female presents with c/o fall, reportedly, fell from bed; leading to desire to be assessed in the ED. The sx(s) have been present for 2 hours. Additional concerns are none are known, patient cannot give any history. Causative factors are none. Palliative factors are she is unable. The distress associated is none. The disorder has been present for 2 hours. Past Medical History  Diagnosis Date  . CVA (cerebral vascular accident)   . Dementia   . Schizo affective schizophrenia   . Hypertension   . Thyroid disease     History reviewed. No pertinent past surgical history.  History reviewed. No pertinent family history.  History  Substance Use Topics  . Smoking status: Not on file  . Smokeless tobacco: Not on file  . Alcohol Use:     OB History    Grav Para Term Preterm Abortions TAB SAB Ect Mult Living                  Review of Systems  Unable to perform ROS   Allergies  Review of patient's allergies indicates no known allergies.  Home Medications   Current Outpatient Rx  Name Route Sig Dispense Refill  . AMLODIPINE BESYLATE 2.5 MG PO TABS Oral Take 2.5 mg by mouth daily.      . ASPIRIN 81 MG PO CHEW Oral Chew 81 mg by mouth daily.      . BUPROPION HCL ER (SR) 100 MG PO TB12 Oral Take 100 mg by mouth 2 (two) times daily.      Marland Kitchen HYDROCODONE-ACETAMINOPHEN 5-325 MG PO TABS Oral Take 1 tablet by mouth every 6 (six) hours as needed. For pain.     Marland Kitchen LEVOTHYROXINE SODIUM 50 MCG PO TABS Oral Take 50 mcg by mouth daily.      Marland Kitchen LISINOPRIL 5 MG PO TABS Oral Take 5 mg by mouth daily.      Marland Kitchen LORAZEPAM 1 MG PO TABS Oral Take 1 mg by mouth daily  at 3 pm.      . MELOXICAM 15 MG PO TABS Oral Take 15 mg by mouth daily.      Marland Kitchen MIRTAZAPINE 15 MG PO TABS Oral Take 15 mg by mouth at bedtime.      Marland Kitchen PRAVASTATIN SODIUM 40 MG PO TABS Oral Take 40 mg by mouth daily.      Marland Kitchen RISPERIDONE 1 MG PO TABS Oral Take 1 mg by mouth at bedtime.      Marland Kitchen RISPERIDONE 3 MG PO TABS Oral Take 3 mg by mouth at bedtime.      . TRIAMCINOLONE ACETONIDE 0.1 % EX CREA Topical Apply 1 application topically 2 (two) times daily.      . VENLAFAXINE HCL ER 75 MG PO CP24 Oral Take 75 mg by mouth 2 (two) times daily.       BP 176/79  Pulse 91  Temp(Src) 97.5 F (36.4 C) (Oral)  Resp 18  Ht 5\' 6"  (1.676 m)  Wt 107 lb (48.535 kg)  BMI 17.27 kg/m2  SpO2 99%  Physical Exam  Nursing note and vitals reviewed. Constitutional: She appears well-developed and well-nourished.  HENT:  Head:  Normocephalic and atraumatic.  Eyes: Conjunctivae and EOM are normal. Pupils are equal, round, and reactive to light.  Neck: Normal range of motion and phonation normal. Neck supple.  Cardiovascular: Normal rate, regular rhythm and intact distal pulses.   Pulmonary/Chest: Effort normal and breath sounds normal. She exhibits no tenderness.  Abdominal: Soft. She exhibits no distension. There is no tenderness. There is no guarding.  Musculoskeletal: She exhibits no edema.       Left elbow lacks full extension and is mildly tender to palpation. There is a small posterior elbow contusion.  Neurological: She is alert. She has normal strength and normal reflexes. She exhibits normal muscle tone.  Skin: Skin is warm and dry.  Psychiatric: She has a normal mood and affect. Her behavior is normal.    ED Course  Procedures (including critical care time)  Labs Reviewed - No data to display Dg Elbow Complete Left  08/09/2011  *RADIOLOGY REPORT*  Clinical Data: Contusion secondary to a fall.  LEFT ELBOW - COMPLETE 3+ VIEW  Comparison: 04/21/2011  Findings: There is no fracture, dislocation, or  joint effusion. Osteopenia.  IMPRESSION: No acute abnormality.  Osteopenia.  Original Report Authenticated By: Gwynn Burly, M.D.     1. Contusion of left elbow       MDM  Reported mechanical fall with minimal injury, and normal evaluation in the emergency department. Doubt need for further workup to evaluate for systemic problems        Flint Melter, MD 08/09/11 1653  Flint Melter, MD 08/09/11 [phone]

## 2011-08-09 NOTE — ED Notes (Signed)
Pt brought by ems from [facility] s/p fall (pt rolled out of bed) at 1400 onto left elbow, pt now c/o pain to elbow and some bruising noted.

## 2011-08-09 NOTE — ED Notes (Signed)
Pt to be taken back to [facility] with ptar, report called.

## 2011-08-17 ENCOUNTER — Emergency Department (HOSPITAL_COMMUNITY)
Admission: EM | Admit: 2011-08-17 | Discharge: 2011-08-17 | Disposition: A | Discharge status: Skilled Nursing Facility | Payer: Medicare Other | Attending: Emergency Medicine | Admitting: Emergency Medicine

## 2011-08-17 ENCOUNTER — Encounter (HOSPITAL_COMMUNITY): Payer: Self-pay | Admitting: *Deleted

## 2011-08-17 DIAGNOSIS — F068 Other specified mental disorders due to known physiological condition: Secondary | ICD-10-CM | POA: Insufficient documentation

## 2011-08-17 DIAGNOSIS — Z8673 Personal history of transient ischemic attack (TIA), and cerebral infarction without residual deficits: Secondary | ICD-10-CM | POA: Insufficient documentation

## 2011-08-17 DIAGNOSIS — W19XXXA Unspecified fall, initial encounter: Secondary | ICD-10-CM

## 2011-08-17 DIAGNOSIS — W010XXA Fall on same level from slipping, tripping and stumbling without subsequent striking against object, initial encounter: Secondary | ICD-10-CM | POA: Insufficient documentation

## 2011-08-17 DIAGNOSIS — Z79899 Other long term (current) drug therapy: Secondary | ICD-10-CM | POA: Insufficient documentation

## 2011-08-17 DIAGNOSIS — Z8659 Personal history of other mental and behavioral disorders: Secondary | ICD-10-CM | POA: Insufficient documentation

## 2011-08-17 DIAGNOSIS — I10 Essential (primary) hypertension: Secondary | ICD-10-CM | POA: Insufficient documentation

## 2011-08-17 NOTE — ED Provider Notes (Signed)
History     CSN: [id number]  Arrival date & time 08/17/11  1500   First MD Initiated Contact with Patient 08/17/11 1501      Chief Complaint  Patient presents with  . Fall   Level 5 caveat for dementia (Consider location/radiation/quality/duration/timing/severity/associated sxs/prior treatment) HPI Pt presents via EMS on backboard with C spine immobilization.  Per patient she saw something on the floor she wanted to pick up and fell. She was found sitting on the floor. Pt denies hitting her head, headache or any pain.  PCP Dr Esmond Harps  Past Medical History  Diagnosis Date  . CVA (cerebral vascular accident)   . Dementia   . Schizo affective schizophrenia   . Hypertension   . Thyroid disease     No past surgical history on file.  No family history on file.  History  Substance Use Topics  . Smoking status: No  . Smokeless tobacco: No  . Alcohol Use:   lives in NH  OB History    Grav Para Term Preterm Abortions TAB SAB Ect Mult Living                  Review of Systems  Unable to perform ROS: Dementia    Allergies  Review of patient's allergies indicates no known allergies.  Home Medications   Current Outpatient Rx  Name Route Sig Dispense Refill  . AMLODIPINE BESYLATE 2.5 MG PO TABS Oral Take 2.5 mg by mouth daily.      . ASPIRIN 81 MG PO CHEW Oral Chew 81 mg by mouth daily.      . BUPROPION HCL ER (SR) 100 MG PO TB12 Oral Take 100 mg by mouth 2 (two) times daily.      Marland Kitchen HYDROCODONE-ACETAMINOPHEN 5-325 MG PO TABS Oral Take 1 tablet by mouth every 6 (six) hours as needed. For pain.     Marland Kitchen LEVOTHYROXINE SODIUM 50 MCG PO TABS Oral Take 50 mcg by mouth daily.      Marland Kitchen LISINOPRIL 5 MG PO TABS Oral Take 5 mg by mouth daily.      Marland Kitchen LORAZEPAM 1 MG PO TABS Oral Take 1 mg by mouth daily at 3 pm.      . MELOXICAM 15 MG PO TABS Oral Take 15 mg by mouth daily.      Marland Kitchen MIRTAZAPINE 15 MG PO TABS Oral Take 15 mg by mouth at bedtime.      Marland Kitchen PRAVASTATIN SODIUM 40 MG PO TABS  Oral Take 40 mg by mouth daily.      Marland Kitchen RISPERIDONE 1 MG PO TABS Oral Take 1 mg by mouth at bedtime.      Marland Kitchen RISPERIDONE 3 MG PO TABS Oral Take 3 mg by mouth at bedtime.      . TRIAMCINOLONE ACETONIDE 0.1 % EX CREA Topical Apply 1 application topically 2 (two) times daily.      . VENLAFAXINE HCL ER 75 MG PO CP24 Oral Take 75 mg by mouth 2 (two) times daily.       BP 155/88  Pulse 86  Temp(Src) 98.5 F (36.9 C) (Oral)  Resp 12  SpO2 99% Vital signs normal except mild hypertension   Physical Exam  Nursing note and vitals reviewed. Constitutional:  Non-toxic appearance. She does not appear ill. No distress.       Thin elderly female on backboard with Cspine precautions.  Pt removed from backboard during my exam and C collar left in place.   HENT:  Head: Normocephalic and atraumatic.  Right Ear: External ear normal.  Left Ear: External ear normal.  Nose: Nose normal. No mucosal edema or rhinorrhea.  Mouth/Throat: Oropharynx is clear and moist and mucous membranes are normal. No dental abscesses or uvula swelling.  Eyes: Conjunctivae and EOM are normal. Pupils are equal, round, and reactive to light.  Neck: Normal range of motion and full passive range of motion without pain. Neck supple.       Pt denies neck pain. Her collar was loosened and she had no pain to palpation in her neck. She had no pain with gentle ROM and the collar was removed.  Cardiovascular: Normal rate, regular rhythm and normal heart sounds.  Exam reveals no gallop and no friction rub.   No murmur heard. Pulmonary/Chest: Effort normal and breath sounds normal. No respiratory distress. She has no wheezes. She has no rhonchi. She has no rales. She exhibits no tenderness and no crepitus.  Abdominal: Soft. Normal appearance and bowel sounds are normal. She exhibits no distension. There is no tenderness. There is no rebound and no guarding.  Musculoskeletal: Normal range of motion. She exhibits no edema and no tenderness.         Moves all extremities well. She has no deformity, pain on palpation or ROM including her pelvis, clavicles.  Neurological: She is alert. She has normal strength. No cranial nerve deficit.  Skin: Skin is warm, dry and intact. No rash noted. No erythema. No pallor.  Psychiatric: She has a normal mood and affect. Her speech is normal and behavior is normal. Her mood appears not anxious.    ED Course  Procedures (including critical care time)  Pt ambulated in the hall with minimal nursing assistance. She denies having any pain. Pt not on anticoagulants so no further studies done.    1. Fall     Plan discharge  Devoria Albe, MD, FACEP   MDM          Ward Givens, MD 08/17/11 [phone]

## 2011-08-17 NOTE — ED Notes (Signed)
[facility] was contacted for pt transfer back to [facility].  Pt remains alert, no distess

## 2011-08-17 NOTE — ED Notes (Signed)
Pt is from [facility], skilled nursing facility.  Pt had an unwitnessed fall.  Staff found patient sitting in the floor.  No obvious injuries.  Pt has dementia, is able to describe the fall, no pain.  Pt is at her baseline.

## 2012-03-28 ENCOUNTER — Encounter (HOSPITAL_COMMUNITY): Payer: Self-pay | Admitting: Emergency Medicine

## 2012-03-28 ENCOUNTER — Emergency Department (HOSPITAL_COMMUNITY): Payer: Medicare Other

## 2012-03-28 ENCOUNTER — Inpatient Hospital Stay (HOSPITAL_COMMUNITY)
Admission: EM | Admit: 2012-03-28 | Discharge: 2012-03-31 | DRG: 690 | Disposition: A | Discharge status: Nursing Home (Includes ICF) | Payer: Medicare Other | Attending: Internal Medicine | Admitting: Internal Medicine

## 2012-03-28 DIAGNOSIS — K8020 Calculus of gallbladder without cholecystitis without obstruction: Secondary | ICD-10-CM | POA: Diagnosis present

## 2012-03-28 DIAGNOSIS — I10 Essential (primary) hypertension: Secondary | ICD-10-CM | POA: Diagnosis present

## 2012-03-28 DIAGNOSIS — K760 Fatty (change of) liver, not elsewhere classified: Secondary | ICD-10-CM

## 2012-03-28 DIAGNOSIS — K7689 Other specified diseases of liver: Secondary | ICD-10-CM | POA: Diagnosis present

## 2012-03-28 DIAGNOSIS — Q441 Other congenital malformations of gallbladder: Secondary | ICD-10-CM

## 2012-03-28 DIAGNOSIS — E876 Hypokalemia: Secondary | ICD-10-CM | POA: Diagnosis present

## 2012-03-28 DIAGNOSIS — Q447 Other congenital malformation of liver, unspecified: Secondary | ICD-10-CM

## 2012-03-28 DIAGNOSIS — F0390 Unspecified dementia without behavioral disturbance: Secondary | ICD-10-CM

## 2012-03-28 DIAGNOSIS — K838 Other specified diseases of biliary tract: Secondary | ICD-10-CM | POA: Diagnosis present

## 2012-03-28 DIAGNOSIS — E875 Hyperkalemia: Secondary | ICD-10-CM | POA: Diagnosis present

## 2012-03-28 DIAGNOSIS — N390 Urinary tract infection, site not specified: Principal | ICD-10-CM | POA: Diagnosis present

## 2012-03-28 DIAGNOSIS — A498 Other bacterial infections of unspecified site: Secondary | ICD-10-CM | POA: Diagnosis present

## 2012-03-28 DIAGNOSIS — E860 Dehydration: Secondary | ICD-10-CM

## 2012-03-28 DIAGNOSIS — R5381 Other malaise: Secondary | ICD-10-CM

## 2012-03-28 DIAGNOSIS — R7989 Other specified abnormal findings of blood chemistry: Secondary | ICD-10-CM | POA: Diagnosis present

## 2012-03-28 DIAGNOSIS — R197 Diarrhea, unspecified: Secondary | ICD-10-CM | POA: Diagnosis present

## 2012-03-28 LAB — COMPREHENSIVE METABOLIC PANEL
ALT: 47 U/L — ABNORMAL HIGH (ref 0–35)
AST: 137 U/L — ABNORMAL HIGH (ref 0–37)
Alkaline Phosphatase: 239 U/L — ABNORMAL HIGH (ref 39–117)
CO2: 21 mEq/L (ref 19–32)
Calcium: 8.9 mg/dL (ref 8.4–10.5)
Chloride: 100 mEq/L (ref 96–112)
GFR calc Af Amer: 73 mL/min — ABNORMAL LOW (ref >90)
GFR calc non Af Amer: 63 mL/min — ABNORMAL LOW (ref >90)
Glucose, Bld: 148 mg/dL — ABNORMAL HIGH (ref 70–99)
Potassium: 5.6 mEq/L — ABNORMAL HIGH (ref 3.5–5.1)
Sodium: 139 mEq/L (ref 135–145)

## 2012-03-28 LAB — CBC WITH DIFFERENTIAL/PLATELET
Eosinophils Absolute: 0 10*3/uL (ref 0.0–0.7)
Hemoglobin: 15 g/dL (ref 12.0–15.0)
Lymphocytes Relative: 8 % — ABNORMAL LOW (ref 12–46)
Lymphs Abs: 0.7 10*3/uL (ref 0.7–4.0)
MCH: 30.4 pg (ref 26.0–34.0)
Monocytes Relative: 4 % (ref 3–12)
Neutro Abs: 7.7 10*3/uL (ref 1.7–7.7)
Neutrophils Relative %: 88 % — ABNORMAL HIGH (ref 43–77)
Platelets: 168 10*3/uL (ref 150–400)
RBC: 4.93 MIL/uL (ref 3.87–5.11)
WBC: 8.7 10*3/uL (ref 4.0–10.5)

## 2012-03-28 LAB — URINALYSIS, ROUTINE W REFLEX MICROSCOPIC
Glucose, UA: NEGATIVE mg/dL
Protein, ur: 100 mg/dL — AB
Specific Gravity, Urine: 1.019 (ref 1.005–1.030)
pH: 6 (ref 5.0–8.0)

## 2012-03-28 LAB — OCCULT BLOOD, POC DEVICE: Fecal Occult Bld: POSITIVE

## 2012-03-28 LAB — URINE MICROSCOPIC-ADD ON

## 2012-03-28 MED ORDER — ONDANSETRON HCL 4 MG PO TABS: 4.0000 mg | ORAL_TABLET | Freq: Four times a day (QID) | ORAL | Status: DC | PRN

## 2012-03-28 MED ORDER — ONDANSETRON HCL 4 MG/2ML IJ SOLN: 4.0000 mg | Freq: Four times a day (QID) | INTRAMUSCULAR | Status: DC | PRN

## 2012-03-28 MED ORDER — MORPHINE SULFATE 2 MG/ML IJ SOLN: 0.5000 mg | INTRAMUSCULAR | Status: DC | PRN

## 2012-03-28 MED ORDER — ENOXAPARIN SODIUM 40 MG/0.4ML ~~LOC~~ SOLN
40.0000 mg | Freq: Every day | SUBCUTANEOUS | Status: DC
  Administered 2012-03-29 – 2012-03-30 (×3): 40 mg via SUBCUTANEOUS
  Filled 2012-03-28 (×4): qty 0.4

## 2012-03-28 MED ORDER — SODIUM CHLORIDE 0.9 % IV BOLUS (SEPSIS)
500.0000 mL | Freq: Once | INTRAVENOUS | Status: AC
  Administered 2012-03-28: 500 mL via INTRAVENOUS

## 2012-03-28 MED ORDER — ASPIRIN 81 MG PO CHEW
81.0000 mg | CHEWABLE_TABLET | Freq: Every day | ORAL | Status: DC
  Administered 2012-03-29 – 2012-03-31 (×3): 81 mg via ORAL
  Filled 2012-03-28 (×3): qty 1

## 2012-03-28 MED ORDER — METRONIDAZOLE IN NACL 5-0.79 MG/ML-% IV SOLN
500.0000 mg | Freq: Three times a day (TID) | INTRAVENOUS | Status: DC
  Administered 2012-03-29 (×2): 500 mg via INTRAVENOUS
  Filled 2012-03-28 (×3): qty 100

## 2012-03-28 MED ORDER — LEVOTHYROXINE SODIUM 50 MCG PO TABS
50.0000 ug | ORAL_TABLET | Freq: Every day | ORAL | Status: DC
  Administered 2012-03-29 – 2012-03-31 (×3): 50 ug via ORAL
  Filled 2012-03-28 (×5): qty 1

## 2012-03-28 MED ORDER — SODIUM CHLORIDE 0.9 % IV SOLN
INTRAVENOUS | Status: DC
  Administered 2012-03-29 – 2012-03-30 (×3): via INTRAVENOUS

## 2012-03-28 MED ORDER — ACETAMINOPHEN 650 MG RE SUPP: 650.0000 mg | Freq: Four times a day (QID) | RECTAL | Status: DC | PRN

## 2012-03-28 MED ORDER — ACETAMINOPHEN 325 MG PO TABS: 650.0000 mg | ORAL_TABLET | Freq: Four times a day (QID) | ORAL | Status: DC | PRN

## 2012-03-28 MED ORDER — BUPROPION HCL ER (SR) 100 MG PO TB12
100.0000 mg | ORAL_TABLET | Freq: Two times a day (BID) | ORAL | Status: DC
  Administered 2012-03-29 – 2012-03-31 (×6): 100 mg via ORAL
  Filled 2012-03-28 (×7): qty 1

## 2012-03-28 MED ORDER — CIPROFLOXACIN IN D5W 400 MG/200ML IV SOLN
400.0000 mg | Freq: Two times a day (BID) | INTRAVENOUS | Status: AC
  Administered 2012-03-29 – 2012-03-30 (×4): 400 mg via INTRAVENOUS
  Filled 2012-03-28 (×4): qty 200

## 2012-03-28 MED ORDER — RISPERIDONE 3 MG PO TABS
3.0000 mg | ORAL_TABLET | Freq: Every day | ORAL | Status: DC
  Administered 2012-03-29 – 2012-03-30 (×3): 3 mg via ORAL
  Filled 2012-03-28 (×4): qty 1

## 2012-03-28 MED ORDER — PIPERACILLIN-TAZOBACTAM 3.375 G IVPB
3.3750 g | Freq: Once | INTRAVENOUS | Status: DC
  Administered 2012-03-28: 3.375 g via INTRAVENOUS
  Filled 2012-03-28: qty 50

## 2012-03-28 MED ORDER — VENLAFAXINE HCL ER 75 MG PO CP24
75.0000 mg | ORAL_CAPSULE | Freq: Two times a day (BID) | ORAL | Status: DC
  Administered 2012-03-29 – 2012-03-31 (×6): 75 mg via ORAL
  Filled 2012-03-28 (×7): qty 1

## 2012-03-28 MED ORDER — MIRTAZAPINE 15 MG PO TABS
15.0000 mg | ORAL_TABLET | Freq: Every day | ORAL | Status: DC
  Administered 2012-03-29 – 2012-03-30 (×3): 15 mg via ORAL
  Filled 2012-03-28 (×4): qty 1

## 2012-03-28 NOTE — ED Provider Notes (Signed)
History     CSN: [id number]  Arrival date & time 03/28/12  1653   First MD Initiated Contact with Patient 03/28/12 1654      Chief Complaint  Patient presents with  . Abdominal Pain  level 5 caveat due to dementia.   (Consider location/radiation/quality/duration/timing/severity/associated sxs/prior treatment) Patient is a 71 y.o. female presenting with abdominal pain. The history is provided by the patient.  Abdominal Pain The primary symptoms of the illness include abdominal pain.   patient is complaining of feeling weak all over. Reportedly sent in from the nursing home with diarrhea and abdominal pain. Patient states she just feels weak. She denies diarrhea. She does have a history of dementia.  Past Medical History  Diagnosis Date  . CVA (cerebral vascular accident)   . Dementia   . Schizo affective schizophrenia   . Hypertension   . Thyroid disease     Past Surgical History  Procedure Date  . Tubal ligation   . Interstitial cystitis     surgery for this  . Foot surgery     bilateral feet surgery on the bone    History reviewed. No pertinent family history.  History  Substance Use Topics  . Smoking status: Never Smoker   . Smokeless tobacco: Never Used  . Alcohol Use: No    OB History    Grav Para Term Preterm Abortions TAB SAB Ect Mult Living                  Review of Systems  Unable to perform ROS Gastrointestinal: Positive for abdominal pain.    Allergies  Review of patient's allergies indicates no known allergies.  Home Medications   Current Outpatient Rx  Name Route Sig Dispense Refill  . AMLODIPINE BESYLATE 2.5 MG PO TABS Oral Take 2.5 mg by mouth daily.     . ASPIRIN 81 MG PO CHEW Oral Chew 81 mg by mouth daily.      . BUPROPION HCL ER (SR) 100 MG PO TB12 Oral Take 100 mg by mouth 2 (two) times daily.      Marland Kitchen LEVOTHYROXINE SODIUM 50 MCG PO TABS Oral Take 50 mcg by mouth daily.      Marland Kitchen LISINOPRIL 5 MG PO TABS Oral Take 5 mg by mouth daily.       Marland Kitchen MIRTAZAPINE 15 MG PO TABS Oral Take 15 mg by mouth at bedtime.      Marland Kitchen PRAVASTATIN SODIUM 40 MG PO TABS Oral Take 40 mg by mouth at bedtime.     Marland Kitchen RISPERIDONE 3 MG PO TABS Oral Take 3 mg by mouth at bedtime.    . VENLAFAXINE HCL ER 75 MG PO CP24 Oral Take 75 mg by mouth 2 (two) times daily.       BP 117/75  Pulse 98  Temp 97.4 F (36.3 C) (Oral)  Resp 17  SpO2 100%  Physical Exam  Constitutional: She appears well-developed.  HENT:  Head: Normocephalic and atraumatic.  Eyes: Pupils are equal, round, and reactive to light.  Neck: Normal range of motion.  Cardiovascular: Normal rate.   Pulmonary/Chest: Effort normal. No respiratory distress.  Abdominal: Soft.       Mild diffuse tenderness without rebound or guarding  Musculoskeletal: Normal range of motion.  Neurological: She is alert.  Skin: Skin is warm.    ED Course  Procedures (including critical care time)  Labs Reviewed  CBC WITH DIFFERENTIAL - Abnormal; Notable for the following:    Neutrophils Relative  88 (*)     Lymphocytes Relative 8 (*)     All other components within normal limits  URINALYSIS, ROUTINE W REFLEX MICROSCOPIC - Abnormal; Notable for the following:    Color, Urine AMBER (*)  BIOCHEMICALS MAY BE AFFECTED BY COLOR   APPearance TURBID (*)     Hgb urine dipstick SMALL (*)     Bilirubin Urine SMALL (*)     Ketones, ur TRACE (*)     Protein, ur 100 (*)     Leukocytes, UA LARGE (*)     All other components within normal limits  COMPREHENSIVE METABOLIC PANEL - Abnormal; Notable for the following:    Potassium 5.6 (*)     Glucose, Bld 148 (*)     AST 137 (*)     ALT 47 (*)     Alkaline Phosphatase 239 (*)     GFR calc non Af Amer 63 (*)     GFR calc Af Amer 73 (*)     All other components within normal limits  URINE MICROSCOPIC-ADD ON - Abnormal; Notable for the following:    Squamous Epithelial / LPF FEW (*)     Bacteria, UA MANY (*)     All other components within normal limits  TROPONIN  I  OCCULT BLOOD, POC DEVICE  STOOL CULTURE  CLOSTRIDIUM DIFFICILE BY PCR  URINE CULTURE   Dg Abd Acute W/chest  03/28/2012  *RADIOLOGY REPORT*  Clinical Data: Abdominal pain, weakness, shortness of breath  ACUTE ABDOMEN SERIES (ABDOMEN 2 VIEW & CHEST 1 VIEW)  Comparison: Chest radiographs dated 02/21/2011  Findings: Mild patchy right lower lobe opacity, likely atelectasis, pneumonia not excluded.  No pleural effusion or pneumothorax.  Cardiomediastinal silhouette is within normal limits.  Nonobstructive bowel gas pattern.  No evidence of free air on the lateral decubitus view.  Sacral stimulator.  Deformity of the left humeral head.  IMPRESSION: Mild patchy right lower lobe opacity, likely atelectasis, pneumonia not excluded.  No evidence of small bowel obstruction or free air.   Original Report Authenticated By: Charline Bills, M.D.      1. UTI (urinary tract infection)   2. Diarrhea   3. Dehydration     Date: 03/28/2012  Rate:68  Rhythm: normal sinus rhythm  QRS Axis: normal  Intervals: normal  ST/T Wave abnormalities: normal  Conduction Disutrbances:right bundle branch block  Narrative Interpretation:   Old EKG Reviewed: unchanged     MDM  Patient with generalized fatigue and diarrhea. Lab work shows a mild hyperkalemia but may be artifact. EKG does not show peak T waves. It will be repeated. Her kidney function is normal. She does have an apparent urinary tract infection. Blood pressure will was initially somewhat low but is improved with fluids. She'll be admitted to medicine.        Juliet Rude. Rubin Payor, MD 03/28/12 2132

## 2012-03-28 NOTE — ED Notes (Signed)
ZOX:[id number]<AV> Expected date:<BR> Expected time:<BR> Means of arrival:<BR> Comments:<BR> Lower abd pain/diaphoretic

## 2012-03-28 NOTE — ED Notes (Signed)
AOZ:HYQM<VH> Expected date:<BR> Expected time:<BR> Means of arrival:<BR> Comments:<BR> Fall-hip pain/ 71yo

## 2012-03-28 NOTE — H&P (Addendum)
[facility] History and Physical  KARNA ABED ZOX:[id number] DOB: 11/04/1940 DOA: 03/28/2012  Referring physician: Dr Rubin Payor PCP: Cala Bradford, MD   Chief Complaint: abdominal pain and diarrhea  HPI: Kellie King is a 71 y.o. female resident of an assisted living facility,with history of dementia, schizoaffective disorder and hypertension who presents with above complaints. history is obtained from ED staff, chart review and Limited contribution from patient. She reports that she's had lower abdominal pain for the past 2 days, unable to further characterize.she denies diarrhea but per facility she did have diarrhea and also in the ED had 2-3 episodes of diarrhea. Per nursing patient's diarrhea in ED was grossly nonbloody but a guaiac done was and came back positive.patient denies cough, fevers and no dysuria. She was seen in the ED and a urinalysis was done and was positive for UTI. Chest x-rayshowed a mild patchy right lower lobe opacity likely atelectasis pneumonia not excluded. As ready mentioned patient has no cough or fevers, also no  shortness of breath.she is admitted for further evaluation and management.   Review of Systems: The patient denies anorexia, fever, weight loss,, vision loss, decreased hearing, hoarseness, chest pain, syncope, dyspnea on exertion, peripheral edema, balance deficits, hemoptysis, melena, hematochezia, severe indigestion/heartburn, hematuria, incontinence, muscle weakness, suspicious skin lesions, transient blindness, difficulty walking, depression, unusual weight change, abnormal bleeding,   Past Medical History  Diagnosis Date  . CVA (cerebral vascular accident)   . Dementia   . Schizo affective schizophrenia   . Hypertension   . Thyroid disease    Past Surgical History  Procedure Date  . Tubal ligation   . Interstitial cystitis     surgery for this  . Foot surgery     bilateral feet surgery on the bone   Social History:   reports that she has never smoked. She has never used smokeless tobacco. She reports that she does not drink alcohol or use illicit drugs. where does patient live--home, ALF   No Known Allergies  History reviewed. No pertinent family history.   Prior to Admission medications   Medication Sig Start Date End Date Taking? Authorizing Provider  amLODipine (NORVASC) 2.5 MG tablet Take 2.5 mg by mouth daily.    Yes Historical Provider, MD  aspirin 81 MG chewable tablet Chew 81 mg by mouth daily.     Yes Historical Provider, MD  buPROPion (WELLBUTRIN SR) 100 MG 12 hr tablet Take 100 mg by mouth 2 (two) times daily.     Yes Historical Provider, MD  levothyroxine (SYNTHROID, LEVOTHROID) 50 MCG tablet Take 50 mcg by mouth daily.     Yes Historical Provider, MD  lisinopril (PRINIVIL,ZESTRIL) 5 MG tablet Take 5 mg by mouth daily.     Yes Historical Provider, MD  mirtazapine (REMERON) 15 MG tablet Take 15 mg by mouth at bedtime.     Yes Historical Provider, MD  pravastatin (PRAVACHOL) 40 MG tablet Take 40 mg by mouth at bedtime.    Yes Historical Provider, MD  risperiDONE (RISPERDAL) 3 MG tablet Take 3 mg by mouth at bedtime.   Yes Historical Provider, MD  venlafaxine (EFFEXOR-XR) 75 MG 24 hr capsule Take 75 mg by mouth 2 (two) times daily.    Yes Historical Provider, MD   Physical Exam: Filed Vitals:   03/28/12 1700 03/28/12 1901 03/28/12 1903  BP: 98/56 117/75   Pulse: 98    Temp:   97.4 F (36.3 C)  TempSrc:   Oral  Resp: 15 17  SpO2: 100%     Constitutional: Vital signs reviewed.  Patient is an elderly white female, poor oral dentition in no acute distress and cooperative with exam. Alert and oriented x2.  Head: Normocephalic and atraumatic Mouth: no erythema or exudates, slightly dry MM Eyes: PERRL, EOMI, conjunctivae normal, No scleral icterus.  Neck: Supple, Trachea midline normal ROM, No JVD, mass, thyromegaly, or carotid bruit present.  Cardiovascular: RRR, S1 normal, S2 normal, no  MRG, pulses symmetric and intact bilaterally Pulmonary/Chest: CTAB, no wheezes, rales, or rhonchi Abdominal: Soft. Suprapubic tenderness present, no rebound tenderness, non-distended, bowel sounds are normal, no masses, organomegaly, or guarding present.  Extremities: No cyanosis and no edema Neurological: A&O x2, Strength is normal and symmetric bilaterally, cranial nerve II-XII are grossly intact, no focal motor deficit, sensory intact to light touch bilaterally.  Skin: Warm, dry and intact. No rash, cyanosis, or clubbing.  Psychiatric: Normal mood and affect. Labs on Admission:  Basic Metabolic Panel:  Lab 03/28/12 1610  NA 139  K 5.6*  CL 100  CO2 21  GLUCOSE 148*  BUN 21  CREATININE 0.89  CALCIUM 8.9  MG --  PHOS --   Liver Function Tests:  Lab 03/28/12 1750  AST 137*  ALT 47*  ALKPHOS 239*  BILITOT 0.4  PROT 6.3  ALBUMIN 3.9   No results found for this basename: LIPASE:5,AMYLASE:5 in the last 168 hours No results found for this basename: AMMONIA:5 in the last 168 hours CBC:  Lab 03/28/12 1750  WBC 8.7  NEUTROABS 7.7  HGB 15.0  HCT 45.5  MCV 92.3  PLT 168   Cardiac Enzymes:  Lab 03/28/12 1750  CKTOTAL --  CKMB --  CKMBINDEX --  TROPONINI <0.30    BNP (last 3 results) No results found for this basename: PROBNP:3 in the last 8760 hours CBG:  Lab 03/28/12 2139  GLUCAP 189*    Radiological Exams on Admission: Dg Abd Acute W/chest  03/28/2012  *RADIOLOGY REPORT*  Clinical Data: Abdominal pain, weakness, shortness of breath  ACUTE ABDOMEN SERIES (ABDOMEN 2 VIEW & CHEST 1 VIEW)  Comparison: Chest radiographs dated 02/21/2011  Findings: Mild patchy right lower lobe opacity, likely atelectasis, pneumonia not excluded.  No pleural effusion or pneumothorax.  Cardiomediastinal silhouette is within normal limits.  Nonobstructive bowel gas pattern.  No evidence of free air on the lateral decubitus view.  Sacral stimulator.  Deformity of the left humeral head.   IMPRESSION: Mild patchy right lower lobe opacity, likely atelectasis, pneumonia not excluded.  No evidence of small bowel obstruction or free air.   Original Report Authenticated By: Charline Bills, M.D.     EKG: Independently reviewed. NSR, rate 94, no acute ischemic changes.  Assessment/Plan Active Problems:  UTI (lower urinary tract infection) -obtain urine cultures, empiric antibiotics with Cipro. -Follow cultures and adjust antibiotics as appropriate.  Diarrhea -as discussed above, it is unclear whether nauseous had any recent antibiotic use. -Obtain C. Difficile cultures, empiric antibiotics with Flagyl and follow. Hyperkalemia -recheck potassium, follow and further treat as appropriate if still elevated. Elevated LFTs -will obtain right upper quadrant ultrasound as well as hepatitis panel -?meds contributing factor-she is on antipsychotics/antidepressants as well as statin-will hold off statin for now pending above studies -Follow recheck Cmet in a.m. History of schizoaffective disorder -continue outpatient medications. History of hypertension -was initially mildly hypotensive in ED now improved with IV fluids, will hold off antihypertensives for now -monitor and further treat accordingly Guaiac positive stool -per EDP, but hemoglobin stable.  Follow recheck hemoglobin. Outpatient followup if hemoglobin remaining stable. ? Right lower lobe opacity versus atelectasis- patient asymptomatic, repeat chest x-ray in a.m. incentive spirometry in am, follow and further manage accordingly.  Dementia  Code Status: full Family Communication: no family available Disposition Plan: admit to Tele  Time spent: >34mins  Kela Millin [facility] Pager [phone]  If 7PM-7AM, please contact night-coverage [email or web address] Password [email or web address] 03/28/2012, 10:00 PM

## 2012-03-28 NOTE — ED Notes (Signed)
Pt started having lower abdominal pain 2 days ago with diarrhea. No n/v. No recent antibiotic use that they know of. From [facility] assisted [facility]. CBG 190.

## 2012-03-29 ENCOUNTER — Inpatient Hospital Stay (HOSPITAL_COMMUNITY): Payer: Medicare Other

## 2012-03-29 DIAGNOSIS — E860 Dehydration: Secondary | ICD-10-CM

## 2012-03-29 LAB — CBC
HCT: 37.9 % (ref 36.0–46.0)
Hemoglobin: 12.5 g/dL (ref 12.0–15.0)
MCH: 30.4 pg (ref 26.0–34.0)
MCV: 92.2 fL (ref 78.0–100.0)
RBC: 4.11 MIL/uL (ref 3.87–5.11)
WBC: 8.9 10*3/uL (ref 4.0–10.5)

## 2012-03-29 LAB — COMPREHENSIVE METABOLIC PANEL
AST: 381 U/L — ABNORMAL HIGH (ref 0–37)
BUN: 26 mg/dL — ABNORMAL HIGH (ref 6–23)
CO2: 27 mEq/L (ref 19–32)
Chloride: 103 mEq/L (ref 96–112)
Creatinine, Ser: 0.88 mg/dL (ref 0.50–1.10)
GFR calc Af Amer: 74 mL/min — ABNORMAL LOW (ref >90)
GFR calc non Af Amer: 64 mL/min — ABNORMAL LOW (ref >90)
Glucose, Bld: 143 mg/dL — ABNORMAL HIGH (ref 70–99)
Total Bilirubin: 0.5 mg/dL (ref 0.3–1.2)

## 2012-03-29 LAB — HEPATITIS PANEL, ACUTE: HCV Ab: NEGATIVE

## 2012-03-29 NOTE — Progress Notes (Signed)
Clinical Social Work Department BRIEF PSYCHOSOCIAL ASSESSMENT 03/29/2012  Patient:  Kellie King, Kellie King     Account Number:  [id number]     Admit date:  03/28/2012  Clinical Social Worker:  Orpah Greek  Date/Time:  03/29/2012 11:35 AM  Referred by:  Physician  Date Referred:  03/29/2012 Referred for  Other - See comment   Other Referral:   Admitted from: [facility]   Interview type:  Family Other interview type:    PSYCHOSOCIAL DATA Living Status:  FACILITY Admitted from facility:  [facility] Level of care:  Assisted Living Primary support name:  [name] (daughter) h#: [id number] c#: [phone] Primary support relationship to patient:  CHILD, ADULT Degree of support available:   good    CURRENT CONCERNS Current Concerns  Post-Acute Placement   Other Concerns:    SOCIAL WORK ASSESSMENT / PLAN CSW spoke with patient's son-in-law, Louis Meckel re: discharge planning. Patient was admitted from [facility] and plans to return there at discharge.   Assessment/plan status:  Information/Referral to [facility] Other assessment/ plan:   Information/referral to community resources:   CSW completed FL2 and faxed information to [employer], confirmed with Denny Peon that patient is ok to return when ready.    PATIENT'S/FAMILY'S RESPONSE TO PLAN OF CARE: Patient's son-in-law expressed that the plan is for her to return to [facility] and that she has been doing better there.        Unice Bailey, LCSW [facility] Clinical Social Worker cell #: [phone]

## 2012-03-29 NOTE — Progress Notes (Signed)
   CARE MANAGEMENT NOTE 03/29/2012  Patient:  Kellie King, Kellie King   Account Number:  [id number]  Date Initiated:  03/29/2012  Documentation initiated by:  Jiles Crocker  Subjective/Objective Assessment:   ADMITTED WITH UTI, DIARRHAE     Action/Plan:   PATIENT RESIDES IN AN ASSISTED LIVING FACILITY; SOCIAL WORKER REFERRAL PLACED   Anticipated DC Date:  04/05/2012   Anticipated DC Plan:  ASSISTED LIVING / REST HOME  In-house referral  Clinical Social Worker                  Status of service:  In process, will continue to follow Medicare Important Message given?  NA - LOS <3 / Initial given by admissions (If response is "NO", the following Medicare IM given date fields will be blank)  Per UR Regulation:  Reviewed for med. necessity/level of care/duration of stay  Comments:  03/29/2012- B Renella Steig RN, BSN, MHA

## 2012-03-29 NOTE — Progress Notes (Signed)
[facility] PROGRESS NOTE  Kellie King ZOX:[id number] DOB: [birth year] DOA: 03/28/2012 PCP: Cala Bradford, MD  Assessment/Plan: 1-UTI (lower urinary tract infection): continue cipro. Culture pending.  2-Diarrhea: C. Diff negative; final report on stool cx pending. Will discontinue empiric flagyl.  3-Hyperkalemia: Labs error; potassium stable and WNL on repeated labs.  4-Dementia and mood disorder: stable; patient AAOX#; will continue treatment for mood disorder.  5-Elevated LFTs: will follow abd [place] and also hepatitis panel; continue IVF's and continue holding statins.  6-HTN: stable without any meds. Patient a little dehydrated due to episodes of diarrhea prior to admission most likely; will hold on antihypertensive agents at this point; follow VS and resume them when appropriated.  7-Thyroid: will continue synthroid; check TSH and adjust as needed.  DVT: lovenox   Code Status: Full Family Communication: no family at bedside Disposition Plan: Back to ALF, when stable   Brief narrative: 71 y/o female with PMH significant for  HTN, thyroid disease, hx of CVA, dementia and schizoafective disorder; admitted 2/2 abdominal pain and diarrhea. Also found with UTI and elevated LFT's.  Consultants:  None  Procedures:  Abd [place] (results pending)  CXR (no acute infiltrates)  Antibiotics:  Cipro and Flagyl  HPI/Subjective: No further episodes of diarrhea or BM's; denies abdominal pain, N/V, CP, SOB or any other complaints.  Objective: Filed Vitals:   03/28/12 2100 03/28/12 2200 03/28/12 2323 03/29/12 0554  BP: 156/67 167/84 152/79 97/62  Pulse: 101 107 104 91  Temp:   97.8 F (36.6 C) 97 F (36.1 C)  TempSrc:   Oral Oral  Resp: 19 21 16 16   Height:   5\' 6"  (1.676 m)   Weight:   54.1 kg (119 lb 4.3 oz)   SpO2: 100% 100% 100% 98%   No intake or output data in the 24 hours ending 03/29/12 1121 Filed Weights   03/28/12 2323  Weight: 54.1 kg (119 lb 4.3 oz)      Exam:   General:  NAD; afebrile; no N/V  Cardiovascular: RRR; no murmurs, no rubs  Respiratory: CTA bilaterally  Abdomen: Soft, NT.ND; positive BS  Extremities: no E/C/C; no joint swelling or erythema  Neuro: non focal deficit  Data Reviewed: Basic Metabolic Panel:  Lab 03/29/12 8119 03/28/12 2240 03/28/12 1750  NA 139 -- 139  K 3.7 4.2 5.6*  CL 103 -- 100  CO2 27 -- 21  GLUCOSE 143* -- 148*  BUN 26* -- 21  CREATININE 0.88 -- 0.89  CALCIUM 8.4 -- 8.9  MG -- -- --  PHOS -- -- --   Liver Function Tests:  Lab 03/29/12 0448 03/28/12 1750  AST 381* 137*  ALT 302* 47*  ALKPHOS 224* 239*  BILITOT 0.5 0.4  PROT 5.6* 6.3  ALBUMIN 3.2* 3.9   CBC:  Lab 03/29/12 0448 03/28/12 1750  WBC 8.9 8.7  NEUTROABS -- 7.7  HGB 12.5 15.0  HCT 37.9 45.5  MCV 92.2 92.3  PLT 181 168   Cardiac Enzymes:  Lab 03/28/12 1750  CKTOTAL --  CKMB --  CKMBINDEX --  TROPONINI <0.30   CBG:  Lab 03/28/12 2139  GLUCAP 189*    Recent Results (from the past 240 hour(s))  CLOSTRIDIUM DIFFICILE BY PCR     Status: Normal   Collection Time   03/28/12  6:05 PM      Component Value Range Status Comment   C difficile by pcr NEGATIVE  NEGATIVE Final      Studies: US Abdomen Complete  03/29/2012  *RADIOLOGY REPORT*  Clinical Data:  Elevated liver function tests.  COMPLETE ABDOMINAL ULTRASOUND  Comparison:  CT abdomen pelvis 07/30/2004.  Findings:  Gallbladder:  Several shadowing echogenic stones are seen, measuring up to 1.3 cm.  Gallbladder wall measures 3 mm, within normal limits.  Negative sonographic Murphy's sign.  Common bile duct:  Measures 12 mm, dilated.  Liver:  Intrahepatic bile ducts appear prominent.  Echotexture may be slightly coarsened.  IVC:  Appears normal.  Pancreas:  Pancreatic duct measures up to 4 mm.  Visualization of the tail is limited by bowel gas.  Spleen:  Measures 5.2 cm, negative.  Right Kidney:  Measures 10.4 cm.  Parenchymal echogenicity is normal.  No  hydronephrosis.  Left Kidney:  Measures 10.0 cm.  Parenchymal echogenicity is normal.  No hydronephrosis.  Abdominal aorta:  The distal aorta is obscured by bowel gas. Otherwise, no aneurysm identified.  IMPRESSION:  1.  Cholelithiasis without acute cholecystitis.  Dilated intrahepatic and extrahepatic bile ducts.  Non-visualized obstructing stone or obstructing lesion could create this appearance.  MRCP without and with contrast could be performed in further evaluation, as clinically indicated. 2.  Coarsened hepatic echotexture is indicative of steatosis. 3.  Minimally dilated pancreatic duct may relate to #1.   Original Report Authenticated By: Reyes Ivan, M.D.    Dg Chest Port 1 View  03/29/2012  *RADIOLOGY REPORT*  Clinical Data: Evaluate for infiltrate.  PORTABLE CHEST - 1 VIEW  Comparison: 05/26/2011  Findings: Single view of the chest was obtained.  Lungs are clear without airspace disease or edema. Heart and mediastinum are within normal limits.  The trachea is midline.  Thorax is intact.  IMPRESSION: No acute chest findings.   Original Report Authenticated By: Richarda Overlie, M.D.    Dg Abd Acute W/chest  03/28/2012  *RADIOLOGY REPORT*  Clinical Data: Abdominal pain, weakness, shortness of breath  ACUTE ABDOMEN SERIES (ABDOMEN 2 VIEW & CHEST 1 VIEW)  Comparison: Chest radiographs dated 02/21/2011  Findings: Mild patchy right lower lobe opacity, likely atelectasis, pneumonia not excluded.  No pleural effusion or pneumothorax.  Cardiomediastinal silhouette is within normal limits.  Nonobstructive bowel gas pattern.  No evidence of free air on the lateral decubitus view.  Sacral stimulator.  Deformity of the left humeral head.  IMPRESSION: Mild patchy right lower lobe opacity, likely atelectasis, pneumonia not excluded.  No evidence of small bowel obstruction or free air.   Original Report Authenticated By: Charline Bills, M.D.     Scheduled Meds:   . aspirin  81 mg Oral Daily  . buPROPion  100  mg Oral BID  . ciprofloxacin  400 mg Intravenous Q12H  . enoxaparin (LOVENOX) injection  40 mg Subcutaneous QHS  . levothyroxine  50 mcg Oral QAC breakfast  . metronidazole  500 mg Intravenous Q8H  . mirtazapine  15 mg Oral QHS  . risperiDONE  3 mg Oral QHS  . sodium chloride  500 mL Intravenous Once  . venlafaxine XR  75 mg Oral BID  . DISCONTD: piperacillin-tazobactam (ZOSYN)  IV  3.375 g Intravenous Once   Continuous Infusions:   . sodium chloride 100 mL/hr at 03/29/12 0007     Time spent: >30 minutes    Dhanush Jokerst  [facility] Pager [phone]. If 8PM-8AM, please contact night-coverage at [email or web address], password [facility] 03/29/2012, 11:21 AM  LOS: 1 day

## 2012-03-30 ENCOUNTER — Inpatient Hospital Stay (HOSPITAL_COMMUNITY): Payer: Medicare Other

## 2012-03-30 ENCOUNTER — Encounter (HOSPITAL_COMMUNITY): Payer: Self-pay | Admitting: Internal Medicine

## 2012-03-30 DIAGNOSIS — R7989 Other specified abnormal findings of blood chemistry: Secondary | ICD-10-CM

## 2012-03-30 DIAGNOSIS — Q447 Other congenital malformation of liver, unspecified: Secondary | ICD-10-CM

## 2012-03-30 DIAGNOSIS — K838 Other specified diseases of biliary tract: Secondary | ICD-10-CM

## 2012-03-30 DIAGNOSIS — K760 Fatty (change of) liver, not elsewhere classified: Secondary | ICD-10-CM | POA: Diagnosis present

## 2012-03-30 DIAGNOSIS — Q441 Other congenital malformations of gallbladder: Secondary | ICD-10-CM

## 2012-03-30 DIAGNOSIS — R197 Diarrhea, unspecified: Secondary | ICD-10-CM

## 2012-03-30 DIAGNOSIS — K7689 Other specified diseases of liver: Secondary | ICD-10-CM

## 2012-03-30 DIAGNOSIS — K8020 Calculus of gallbladder without cholecystitis without obstruction: Secondary | ICD-10-CM

## 2012-03-30 LAB — LIPID PANEL
LDL Cholesterol: 54 mg/dL (ref 0–99)
Total CHOL/HDL Ratio: 2.1 RATIO
Triglycerides: 42 mg/dL (ref <150)
VLDL: 8 mg/dL (ref 0–40)

## 2012-03-30 LAB — CBC
Hemoglobin: 11.1 g/dL — ABNORMAL LOW (ref 12.0–15.0)
MCHC: 34 g/dL (ref 30.0–36.0)
Platelets: 141 10*3/uL — ABNORMAL LOW (ref 150–400)
RDW: 13.2 % (ref 11.5–15.5)

## 2012-03-30 LAB — MAGNESIUM: Magnesium: 1.8 mg/dL (ref 1.5–2.5)

## 2012-03-30 LAB — BASIC METABOLIC PANEL
GFR calc Af Amer: >90 mL/min (ref >90)
GFR calc non Af Amer: 87 mL/min — ABNORMAL LOW (ref >90)
Glucose, Bld: 112 mg/dL — ABNORMAL HIGH (ref 70–99)
Potassium: 3.2 mEq/L — ABNORMAL LOW (ref 3.5–5.1)
Sodium: 142 mEq/L (ref 135–145)

## 2012-03-30 MED ORDER — CIPROFLOXACIN HCL 500 MG PO TABS
500.0000 mg | ORAL_TABLET | Freq: Two times a day (BID) | ORAL | Status: DC
  Administered 2012-03-30 – 2012-03-31 (×2): 500 mg via ORAL
  Filled 2012-03-30 (×4): qty 1

## 2012-03-30 MED ORDER — POTASSIUM CHLORIDE CRYS ER 20 MEQ PO TBCR
40.0000 meq | EXTENDED_RELEASE_TABLET | ORAL | Status: AC
  Administered 2012-03-30 (×3): 40 meq via ORAL
  Filled 2012-03-30 (×3): qty 2

## 2012-03-30 NOTE — Progress Notes (Signed)
This patient is receiving the antibiotic(s) Cipro by the intravenous route. Based on criteria approved by the Pharmacy and Therapeutics Committee, and the Infectious Disease Division, the antibiotic(s) is / are being converted to equivalent oral dose form(s). These criteria include: . Patient being treated for a respiratory tract infection, urinary tract infection, cellulitis, or Clostridium Difficile Associated Diarrhea . The patient is not neutropenic and does not exhibit a GI malabsorption state . The patient is eating (either orally or per tube) and/or has been taking other orally administered medications for at least 24 hours. . The patient is improving clinically (physician assessment and a 24-hour Tmax of < 100.5 F).  If you have questions about this conversion, please contact the pharmacy department. Thank you.  Clance Boll, PharmD, BCPS Pager: [phone] 03/30/2012 11:09 AM

## 2012-03-30 NOTE — Consult Note (Addendum)
Referring Provider: No ref. provider found Primary Care Physician:  Cala Bradford, MD Primary Gastroenterologist:  None  Reason for Consultation:  Elevated LFT's with abnormal ultrasound.  HPI: Kellie King is a 71 y.o. female who was brought to the ED at [facility] [address] from her assisted living facility with complaints of abdominal pain and diarrhea.  Patient has some degree of dementia (poor historian) and denies abdominal pain, but admits to diarrhea 4-5 times a day for the last 3 days or so.  When she came in she admitted to abdominal pain, but denied diarrhea.  The assisted living and here in the ED reported that she had grossly non-bloody diarrhea.  Cdiff is negative and culture is pending.  Her labs were relatively unremarkable except for her LFT's.  AST was 381, ALT 302, ALP 224, and tbili 0.5.  Ultrasound showed "1.  Cholelithiasis without acute cholecystitis.  Dilated intrahepatic and extrahepatic bile ducts.  Non-visualized obstructing stone or obstructing lesion could create this appearance.  MRCP without and with contrast could be performed in further evaluation, as clinically indicated. 2.  Coarsened hepatic echotexture is indicative of steatosis. 3.  Minimally dilated pancreatic duct may relate to #1."   An MRI/MRCP has been ordered.  LFT's were normal in October 2012.  Patient says that the diarrhea has lessened since she was admitted.  She denies nausea, vomiting, dark or bloody stool, decreased appetite or weight loss (says that her appetite is good).  Just of note, she was on a statin, which has been held, as well as antipsychotics/antidepressants.     Past Medical History  Diagnosis Date  . CVA (cerebral vascular accident)   . Dementia   . Schizo affective schizophrenia   . Hypertension   . Thyroid disease     Past Surgical History  Procedure Date  . Tubal ligation   . Interstitial cystitis     surgery for this  . Foot surgery     bilateral feet surgery on the bone    neurostimulator implanted  Prior to Admission medications   Medication Sig Start Date End Date Taking? Authorizing Provider  amLODipine (NORVASC) 2.5 MG tablet Take 2.5 mg by mouth daily.    Yes Historical Provider, MD  aspirin 81 MG chewable tablet Chew 81 mg by mouth daily.     Yes Historical Provider, MD  buPROPion (WELLBUTRIN SR) 100 MG 12 hr tablet Take 100 mg by mouth 2 (two) times daily.     Yes Historical Provider, MD  levothyroxine (SYNTHROID, LEVOTHROID) 50 MCG tablet Take 50 mcg by mouth daily.     Yes Historical Provider, MD  lisinopril (PRINIVIL,ZESTRIL) 5 MG tablet Take 5 mg by mouth daily.     Yes Historical Provider, MD  mirtazapine (REMERON) 15 MG tablet Take 15 mg by mouth at bedtime.     Yes Historical Provider, MD  pravastatin (PRAVACHOL) 40 MG tablet Take 40 mg by mouth at bedtime.    Yes Historical Provider, MD  risperiDONE (RISPERDAL) 3 MG tablet Take 3 mg by mouth at bedtime.   Yes Historical Provider, MD  venlafaxine (EFFEXOR-XR) 75 MG 24 hr capsule Take 75 mg by mouth 2 (two) times daily.    Yes Historical Provider, MD    Current Facility-Administered Medications  Medication Dose Route Frequency Provider Last Rate Last Dose  . 0.9 %  sodium chloride infusion   Intravenous Continuous Vassie Loll, MD 20 mL/hr at 03/30/12 1054    . acetaminophen (TYLENOL) tablet 650 mg  650  mg Oral Q6H PRN Kela Millin, MD       Or  . acetaminophen (TYLENOL) suppository 650 mg  650 mg Rectal Q6H PRN Adeline C Viyuoh, MD      . aspirin chewable tablet 81 mg  81 mg Oral Daily Adeline C Viyuoh, MD   81 mg at 03/30/12 1052  . buPROPion (WELLBUTRIN SR) 12 hr tablet 100 mg  100 mg Oral BID Kela Millin, MD   100 mg at 03/30/12 1052  . ciprofloxacin (CIPRO) IVPB 400 mg  400 mg Intravenous Q12H Maryanna Shape Runyon, PHARMD   400 mg at 03/30/12 1052  . ciprofloxacin (CIPRO) tablet 500 mg  500 mg Oral BID Maryanna Shape Runyon, PHARMD      . enoxaparin (LOVENOX) injection 40 mg  40 mg  Subcutaneous QHS Adeline C Viyuoh, MD   40 mg at 03/29/12 2200  . levothyroxine (SYNTHROID, LEVOTHROID) tablet 50 mcg  50 mcg Oral QAC breakfast Kela Millin, MD   50 mcg at 03/30/12 0734  . mirtazapine (REMERON) tablet 15 mg  15 mg Oral QHS Adeline C Viyuoh, MD   15 mg at 03/29/12 2200  . morphine 2 MG/ML injection 0.5-1 mg  0.5-1 mg Intravenous Q4H PRN Adeline C Viyuoh, MD      . ondansetron (ZOFRAN) tablet 4 mg  4 mg Oral Q6H PRN Adeline C Viyuoh, MD       Or  . ondansetron (ZOFRAN) injection 4 mg  4 mg Intravenous Q6H PRN Adeline C Viyuoh, MD      . potassium chloride SA (K-DUR,KLOR-CON) CR tablet 40 mEq  40 mEq Oral Q4H Vassie Loll, MD   40 mEq at 03/30/12 1052  . risperiDONE (RISPERDAL) tablet 3 mg  3 mg Oral QHS Adeline C Viyuoh, MD   3 mg at 03/29/12 2200  . venlafaxine XR (EFFEXOR-XR) 24 hr capsule 75 mg  75 mg Oral BID Kela Millin, MD   75 mg at 03/30/12 1052    Allergies as of 03/28/2012  . (No Known Allergies)    History reviewed. No pertinent family history.  History   Social History  . Marital Status: Widowed                       Social History Main Topics  . Smoking status: Never Smoker   . Smokeless tobacco: Never Used  . Alcohol Use: No  . Drug Use: No     Review of Systems: Ten point ROS O/W negative except as mentioned in HPI.  Physical Exam: Vital signs in last 24 hours: Temp:  [97.7 F (36.5 C)-98.8 F (37.1 C)] 97.7 F (36.5 C) (08/29 0516) Pulse Rate:  [88-94] 94  (08/29 0516) Resp:  [16] 16  (08/29 0516) BP: (105-134)/(63-75) 128/75 mmHg (08/29 0516) SpO2:  [95 %-99 %] 95 % (08/29 0516) Last BM Date: 03/28/12 General:   Alert,  Well-developed, well-nourished, pleasant and cooperative in NAD Head:  Normocephalic and atraumatic. Eyes:  Sclera clear, no icterus.  Conjunctiva pink. Ears:  Normal auditory acuity. Mouth:  No deformity or lesions.  Poor dentition.  Lungs:  Clear throughout to auscultation.   No wheezes, crackles, or  rhonchi. Heart:  Regular rate and rhythm; no murmurs, clicks, rubs,  or gallops. Abdomen:  Soft, non-distended, BS active, nonpalp mass or hsm.  Lower abdominal TTP (seems mostly suprapubic).   Rectal:  Deferred  Msk:  Symmetrical without gross deformities. . Pulses:  Normal pulses noted.  Extremities:  Without clubbing or edema. Neurologic:  Alert and  oriented only to self;  grossly normal neurologically. Skin:  Intact without significant lesions or rashes.. Psych:  Alert and cooperative. Normal mood and affect.  Intake/Output from previous day: 08/28 0701 - 08/29 0700 In: 3123.3 [P.O.:120; I.V.:2403.3; IV Piggyback:600] Out: -   Lab Results:  Basename 03/30/12 0410 03/29/12 0448 03/28/12 1750  WBC 6.9 8.9 8.7  HGB 11.1* 12.5 15.0  HCT 32.6* 37.9 45.5  PLT 141* 181 168   BMET  Basename 03/30/12 0410 03/29/12 0448 03/28/12 2240 03/28/12 1750  NA 142 139 -- 139  K 3.2* 3.7 4.2 --  CL 108 103 -- 100  CO2 26 27 -- 21  GLUCOSE 112* 143* -- 148*  BUN 14 26* -- 21  CREATININE 0.63 0.88 -- 0.89  CALCIUM 8.2* 8.4 -- 8.9   LFT  Basename 03/29/12 0448  PROT 5.6*  ALBUMIN 3.2*  AST 381*  ALT 302*  ALKPHOS 224*  BILITOT 0.5  BILIDIR --  IBILI --   Hepatitis Panel  Basename 03/29/12 0448  HEPBSAG NEGATIVE  HCVAB NEGATIVE  HEPAIGM NEGATIVE  HEPBIGM NEGATIVE    Studies/Results: US Abdomen Complete  03/29/2012  *RADIOLOGY REPORT*  Clinical Data:  Elevated liver function tests.  COMPLETE ABDOMINAL ULTRASOUND  Comparison:  CT abdomen pelvis 07/30/2004.  Findings:  Gallbladder:  Several shadowing echogenic stones are seen, measuring up to 1.3 cm.  Gallbladder wall measures 3 mm, within normal limits.  Negative sonographic Murphy's sign.  Common bile duct:  Measures 12 mm, dilated.  Liver:  Intrahepatic bile ducts appear prominent.  Echotexture may be slightly coarsened.  IVC:  Appears normal.  Pancreas:  Pancreatic duct measures up to 4 mm.  Visualization of the tail is limited  by bowel gas.  Spleen:  Measures 5.2 cm, negative.  Right Kidney:  Measures 10.4 cm.  Parenchymal echogenicity is normal.  No hydronephrosis.  Left Kidney:  Measures 10.0 cm.  Parenchymal echogenicity is normal.  No hydronephrosis.  Abdominal aorta:  The distal aorta is obscured by bowel gas. Otherwise, no aneurysm identified.  IMPRESSION:  1.  Cholelithiasis without acute cholecystitis.  Dilated intrahepatic and extrahepatic bile ducts.  Non-visualized obstructing stone or obstructing lesion could create this appearance.  MRCP without and with contrast could be performed in further evaluation, as clinically indicated. 2.  Coarsened hepatic echotexture is indicative of steatosis. 3.  Minimally dilated pancreatic duct may relate to #1.   Original Report Authenticated By: Reyes Ivan, M.D.     Images viewed Iva Boop, MD, [facility]  Dg Chest Port 1 View  03/29/2012  *RADIOLOGY REPORT*  Clinical Data: Evaluate for infiltrate.  PORTABLE CHEST - 1 VIEW  Comparison: 05/26/2011  Findings: Single view of the chest was obtained.  Lungs are clear without airspace disease or edema. Heart and mediastinum are within normal limits.  The trachea is midline.  Thorax is intact.  IMPRESSION: No acute chest findings.   Original Report Authenticated By: Richarda Overlie, M.D.    Dg Abd Acute W/chest  03/28/2012  *RADIOLOGY REPORT*  Clinical Data: Abdominal pain, weakness, shortness of breath  ACUTE ABDOMEN SERIES (ABDOMEN 2 VIEW & CHEST 1 VIEW)  Comparison: Chest radiographs dated 02/21/2011  Findings: Mild patchy right lower lobe opacity, likely atelectasis, pneumonia not excluded.  No pleural effusion or pneumothorax.  Cardiomediastinal silhouette is within normal limits.  Nonobstructive bowel gas pattern.  No evidence of free air on the lateral decubitus view.  Sacral  stimulator.  Deformity of the left humeral head.  IMPRESSION: Mild patchy right lower lobe opacity, likely atelectasis, pneumonia not excluded.  No evidence  of small bowel obstruction or free air.   Original Report Authenticated By: Charline Bills, M.D.     IMPRESSION:  -Abnormal LFT's with dilated intra- and extra-hepatic ducts on ultrasound-rule out non-visualized stone or lesion. -Cholelithiasis -Diarrhea-improving.  Cdiff negative. -Abdominal pain-lower abdomen-? Related to diarrhea vs UTI -UTI-on cipro per primary service.  PLAN: -Follow-up results of MRI/MRCP -Trend LFT's -Follow-up stool culture -Agree with holding statin for now.  ZEHR, JESSICA D.  03/30/2012, 12:27 PM  Pager number [phone]   [facility] Attending  I have also seen and assessed the patient and agree with the above note. Her MRI was cancelled because of a neurostimulator in the pelvis. Doubt she could have held breath well anyway.  Clinical scenario difficult to sort out.   Needs further evaluation of dilated bile duct - since cannot get MR and need more info I am going to get CT to see if that helps. Suspect she will need an ERCP but given her situation, non-invasive imaging for additional info is worthwhile. i called but could not reach daughter. ERCP would be best under general anesthesia, she cannot consent for herself. It is not urgent as she seems asymptomatic from this unless diarrhea somehow related but unlikley.    Iva Boop, MD, [facility] [phone] (pager) 03/30/2012 7:54 PM

## 2012-03-30 NOTE — Progress Notes (Signed)
[facility] PROGRESS NOTE  Kellie King HQI:[id number] DOB: 1940-11-02 DOA: 03/28/2012 PCP: Cala Bradford, MD  Assessment/Plan: 1-UTI (lower urinary tract infection): continue cipro. Culture pending.  2-Diarrhea: C. Diff negative; final report on stool cx pending. Will discontinue empiric flagyl.  3-Hypokalemia: due to diarrhea episodes. Will replete. No further diarrhea since yesterday.  4-Dementia and mood disorder: stable; patient AAOX2; will continue treatment for mood disorder as prescribed prior to admission; continue supportive care.Marland Kitchen  5-Elevated LFTs: abd US revealing steatosis and also cholelithiasis with dilated intrahepatic and extrahepatic bile ducts; hepatitis panel negative. Will continue holding statins, order MRCP and will consult GI regarding ongoing transaminitis and also dilated intra/extrahepatic bile ducts; will follow further rec's.   6-HTN: stable without any meds. Will continue holding on antihypertensive agents at this point; follow VS and resume them when appropriate; low sodium heart healthy diet.  7-Thyroid: will continue synthroid; TSH WNL.  DVT: lovenox   Code Status: Full Family Communication: no family at bedside Disposition Plan: Back to ALF, when stable   Brief narrative: 71 y/o female with PMH significant for  HTN, thyroid disease, hx of CVA, dementia and schizoafective disorder; admitted 2/2 abdominal pain and diarrhea. Also found with UTI and elevated LFT's.  Consultants:  None  Procedures:  Abd [place] (results pending)  CXR (no acute infiltrates)  Antibiotics:  Cipro and Flagyl  HPI/Subjective: No further episodes of diarrhea or BM's overnight; denies abdominal pain, N/V, CP, SOB or any other complaints.  Objective: Filed Vitals:   03/29/12 0554 03/29/12 1506 03/29/12 2119 03/30/12 0516  BP: 97/62 105/63 134/74 128/75  Pulse: 91 88 89 94  Temp: 97 F (36.1 C) 98 F (36.7 C) 98.8 F (37.1 C) 97.7 F (36.5 C)    TempSrc: Oral Oral Oral Oral  Resp: 16 16 16 16   Height:      Weight:      SpO2: 98% 99% 96% 95%    Intake/Output Summary (Last 24 hours) at 03/30/12 1045 Last data filed at 03/30/12 0700  Gross per 24 hour  Intake 3123.33 ml  Output      0 ml  Net 3123.33 ml   Filed Weights   03/28/12 2323  Weight: 54.1 kg (119 lb 4.3 oz)    Exam:   General:  NAD; afebrile; no N/V  Cardiovascular: RRR; no murmurs, no rubs  Respiratory: CTA bilaterally  Abdomen: Soft, NT/ND; positive BS  Extremities: no E/C/C; no joint swelling or erythema  Neuro: non focal deficit  Data Reviewed: Basic Metabolic Panel:  Lab 03/30/12 1324 03/29/12 0448 03/28/12 2240 03/28/12 1750  NA 142 139 -- 139  K 3.2* 3.7 4.2 5.6*  CL 108 103 -- 100  CO2 26 27 -- 21  GLUCOSE 112* 143* -- 148*  BUN 14 26* -- 21  CREATININE 0.63 0.88 -- 0.89  CALCIUM 8.2* 8.4 -- 8.9  MG 1.8 -- -- --  PHOS -- -- -- --   Liver Function Tests:  Lab 03/29/12 0448 03/28/12 1750  AST 381* 137*  ALT 302* 47*  ALKPHOS 224* 239*  BILITOT 0.5 0.4  PROT 5.6* 6.3  ALBUMIN 3.2* 3.9   CBC:  Lab 03/30/12 0410 03/29/12 0448 03/28/12 1750  WBC 6.9 8.9 8.7  NEUTROABS -- -- 7.7  HGB 11.1* 12.5 15.0  HCT 32.6* 37.9 45.5  MCV 91.3 92.2 92.3  PLT 141* 181 168   Cardiac Enzymes:  Lab 03/28/12 1750  CKTOTAL --  CKMB --  CKMBINDEX --  TROPONINI <0.30  CBG:  Lab 03/28/12 2139  GLUCAP 189*    Recent Results (from the past 240 hour(s))  CLOSTRIDIUM DIFFICILE BY PCR     Status: Normal   Collection Time   03/28/12  6:05 PM      Component Value Range Status Comment   C difficile by pcr NEGATIVE  NEGATIVE Final      Studies: US Abdomen Complete  03/29/2012  *RADIOLOGY REPORT*  Clinical Data:  Elevated liver function tests.  COMPLETE ABDOMINAL ULTRASOUND  Comparison:  CT abdomen pelvis 07/30/2004.  Findings:  Gallbladder:  Several shadowing echogenic stones are seen, measuring up to 1.3 cm.  Gallbladder wall measures 3  mm, within normal limits.  Negative sonographic Murphy's sign.  Common bile duct:  Measures 12 mm, dilated.  Liver:  Intrahepatic bile ducts appear prominent.  Echotexture may be slightly coarsened.  IVC:  Appears normal.  Pancreas:  Pancreatic duct measures up to 4 mm.  Visualization of the tail is limited by bowel gas.  Spleen:  Measures 5.2 cm, negative.  Right Kidney:  Measures 10.4 cm.  Parenchymal echogenicity is normal.  No hydronephrosis.  Left Kidney:  Measures 10.0 cm.  Parenchymal echogenicity is normal.  No hydronephrosis.  Abdominal aorta:  The distal aorta is obscured by bowel gas. Otherwise, no aneurysm identified.  IMPRESSION:  1.  Cholelithiasis without acute cholecystitis.  Dilated intrahepatic and extrahepatic bile ducts.  Non-visualized obstructing stone or obstructing lesion could create this appearance.  MRCP without and with contrast could be performed in further evaluation, as clinically indicated. 2.  Coarsened hepatic echotexture is indicative of steatosis. 3.  Minimally dilated pancreatic duct may relate to #1.   Original Report Authenticated By: Reyes Ivan, M.D.    Dg Chest Port 1 View  03/29/2012  *RADIOLOGY REPORT*  Clinical Data: Evaluate for infiltrate.  PORTABLE CHEST - 1 VIEW  Comparison: 05/26/2011  Findings: Single view of the chest was obtained.  Lungs are clear without airspace disease or edema. Heart and mediastinum are within normal limits.  The trachea is midline.  Thorax is intact.  IMPRESSION: No acute chest findings.   Original Report Authenticated By: Richarda Overlie, M.D.    Dg Abd Acute W/chest  03/28/2012  *RADIOLOGY REPORT*  Clinical Data: Abdominal pain, weakness, shortness of breath  ACUTE ABDOMEN SERIES (ABDOMEN 2 VIEW & CHEST 1 VIEW)  Comparison: Chest radiographs dated 02/21/2011  Findings: Mild patchy right lower lobe opacity, likely atelectasis, pneumonia not excluded.  No pleural effusion or pneumothorax.  Cardiomediastinal silhouette is within normal  limits.  Nonobstructive bowel gas pattern.  No evidence of free air on the lateral decubitus view.  Sacral stimulator.  Deformity of the left humeral head.  IMPRESSION: Mild patchy right lower lobe opacity, likely atelectasis, pneumonia not excluded.  No evidence of small bowel obstruction or free air.   Original Report Authenticated By: Charline Bills, M.D.     Scheduled Meds:    . aspirin  81 mg Oral Daily  . buPROPion  100 mg Oral BID  . ciprofloxacin  400 mg Intravenous Q12H  . enoxaparin (LOVENOX) injection  40 mg Subcutaneous QHS  . levothyroxine  50 mcg Oral QAC breakfast  . mirtazapine  15 mg Oral QHS  . potassium chloride  40 mEq Oral Q4H  . risperiDONE  3 mg Oral QHS  . venlafaxine XR  75 mg Oral BID  . DISCONTD: metronidazole  500 mg Intravenous Q8H   Continuous Infusions:    . sodium chloride 100  mL/hr at 03/29/12 1725     Time spent: >30 minutes    Timoth Schara  [facility] Pager [phone]. If 8PM-8AM, please contact night-coverage at [email or web address], password [facility] 03/30/2012, 10:45 AM  LOS: 2 days

## 2012-03-30 NOTE — Evaluation (Signed)
Physical Therapy Evaluation Patient Details Name: Kellie King MRN: [id number] DOB: [birth year] Today's Date: 03/30/2012 Time: 6578-4696 PT Time Calculation (min): 15 min  PT Assessment / Plan / Recommendation Clinical Impression  71 yo female admitted with UTI. Pt is from [facility]. Required Min-Mod assist for mobility. Recommend HHPT at ALF as long as facility is able to provide current level of care.     PT Assessment  Patient needs continued PT services    Follow Up Recommendations  Home health PT at ALF;Supervision for mobility/OOB    Barriers to Discharge        Equipment Recommendations  None recommended by PT    Recommendations for Other Services OT consult   Frequency Min 3X/week    Precautions / Restrictions Precautions Precautions: Fall Restrictions Weight Bearing Restrictions: No   Pertinent Vitals/Pain      Mobility  Bed Mobility Bed Mobility: Supine to Sit Supine to Sit: 3: Mod assist;HOB elevated Details for Bed Mobility Assistance: Assist for bil LEs off bed and trunk to upright. Multimodal cues for safety, technique, hand placement.  Transfers Transfers: Sit to Stand;Stand to [employer] Transfers Sit to Stand: 4: Min assist;From bed;With upper extremity assist Stand to Sit: 4: Min assist;To chair/3-in-1;With upper extremity assist;With armrests Details for Transfer Assistance: Multmodal cues for safety, technique, hand placement. Assist to rise, stabilize, control descent. Attempted to use RW but pt unable to grasp L walker handle. Stand pivot to R side to recliner.  Ambulation/Gait Ambulation/Gait Assistance: Not tested (comment) Ambulation/Gait Assistance Details: Per pt, she is non-ambulatory. She uses wheelchair at baseline.     Exercises     PT Diagnosis: Generalized weakness;Difficulty walking  PT Problem List: Decreased strength;Decreased balance;Decreased mobility;Decreased cognition;Decreased safety awareness;Decreased  activity tolerance PT Treatment Interventions: Functional mobility training;Therapeutic activities;Therapeutic exercise;Balance training;Patient/family education   PT Goals Acute Rehab PT Goals PT Goal Formulation: Patient unable to participate in goal setting Time For Goal Achievement: 04/13/12 Potential to Achieve Goals: Good Pt will go Supine/Side to Sit: with supervision PT Goal: Supine/Side to Sit - Progress: Goal set today Pt will go Sit to Supine/Side: with supervision PT Goal: Sit to Supine/Side - Progress: Goal set today Pt will go Sit to Stand: with supervision PT Goal: Sit to Stand - Progress: Goal set today Pt will Transfer Bed to Chair/Chair to Bed: with supervision PT Transfer Goal: Bed to Chair/Chair to Bed - Progress: Goal set today  Visit Information  Last PT Received On: 03/30/12 Assistance Needed: +1    Subjective Data  Subjective: "I use the wheelchair" Patient Stated Goal: None stated   Prior Functioning  Home Living Type of Home: Assisted living ([facility]) Home Adaptive Equipment: Wheelchair - manual Prior Function Level of Independence: Needs assistance Communication Communication: No difficulties    Cognition  Overall Cognitive Status: History of cognitive impairments - at baseline Arousal/Alertness: Awake/alert Orientation Level: Disoriented to;Situation;Time;Place Behavior During Session: [facility] for tasks performed Cognition - Other Comments: Decreased safety awareness. Impulsive.     Extremity/Trunk Assessment Left Upper Extremity Assessment LUE ROM/Strength/Tone: Deficits LUE ROM/Strength/Tone Deficits: No functional use of UE due to hx of CVA Right Lower Extremity Assessment RLE ROM/Strength/Tone: [facility] for tasks assessed Left Lower Extremity Assessment LLE ROM/Strength/Tone: Deficits;Unable to fully assess;Due to impaired cognition LLE ROM/Strength/Tone Deficits: Pt able to weightbear. Strength at least 3/5   Balance Balance Balance  Assessed: Yes Static Standing Balance Static Standing - Balance Support: Right upper extremity supported Static Standing - Level of Assistance: 4:  Min assist Dynamic Standing Balance Dynamic Standing - Balance Support: Right upper extremity supported Dynamic Standing - Level of Assistance: 4: Min assist  End of Session PT - End of Session Equipment Utilized During Treatment: Gait belt Activity Tolerance: Patient limited by fatigue Patient left: in chair;with call bell/phone within reach  GP     Rebeca Alert [facility] 03/30/2012, 3:08 PM [phone]

## 2012-03-31 ENCOUNTER — Inpatient Hospital Stay (HOSPITAL_COMMUNITY): Payer: Medicare Other

## 2012-03-31 DIAGNOSIS — R5381 Other malaise: Secondary | ICD-10-CM

## 2012-03-31 DIAGNOSIS — K8020 Calculus of gallbladder without cholecystitis without obstruction: Secondary | ICD-10-CM

## 2012-03-31 DIAGNOSIS — I10 Essential (primary) hypertension: Secondary | ICD-10-CM

## 2012-03-31 LAB — COMPREHENSIVE METABOLIC PANEL
ALT: 84 U/L — ABNORMAL HIGH (ref 0–35)
Albumin: 3 g/dL — ABNORMAL LOW (ref 3.5–5.2)
Alkaline Phosphatase: 147 U/L — ABNORMAL HIGH (ref 39–117)
Calcium: 8.6 mg/dL (ref 8.4–10.5)
Potassium: 4.3 mEq/L (ref 3.5–5.1)
Sodium: 140 mEq/L (ref 135–145)
Total Protein: 5.8 g/dL — ABNORMAL LOW (ref 6.0–8.3)

## 2012-03-31 LAB — URINE CULTURE

## 2012-03-31 MED ORDER — AMLODIPINE BESYLATE 2.5 MG PO TABS: 5.0000 mg | ORAL_TABLET | Freq: Every day | ORAL | Status: DC

## 2012-03-31 MED ORDER — PANTOPRAZOLE SODIUM 40 MG PO TBEC: 40.0000 mg | DELAYED_RELEASE_TABLET | Freq: Every day | ORAL | Status: DC

## 2012-03-31 MED ORDER — PANTOPRAZOLE SODIUM 40 MG PO TBEC
40.0000 mg | DELAYED_RELEASE_TABLET | Freq: Every day | ORAL | Status: DC
  Administered 2012-03-31: 40 mg via ORAL
  Filled 2012-03-31: qty 1

## 2012-03-31 MED ORDER — CEFUROXIME AXETIL 500 MG PO TABS
500.0000 mg | ORAL_TABLET | Freq: Two times a day (BID) | ORAL | Status: DC
  Administered 2012-03-31 (×2): 500 mg via ORAL
  Filled 2012-03-31 (×3): qty 1

## 2012-03-31 MED ORDER — IOHEXOL 300 MG/ML  SOLN
100.0000 mL | Freq: Once | INTRAMUSCULAR | Status: AC | PRN
  Administered 2012-03-31: 100 mL via INTRAVENOUS

## 2012-03-31 MED ORDER — CEFUROXIME AXETIL 500 MG PO TABS: 500.0000 mg | ORAL_TABLET | Freq: Two times a day (BID) | ORAL | Status: AC

## 2012-03-31 MED ORDER — AMLODIPINE BESYLATE 5 MG PO TABS
5.0000 mg | ORAL_TABLET | Freq: Every day | ORAL | Status: DC
  Administered 2012-03-31: 5 mg via ORAL
  Filled 2012-03-31: qty 1

## 2012-03-31 NOTE — Progress Notes (Signed)
Patient is set to discharge back to [facility]. Daughter & son-in-law made aware. [facility] called for transport.   Unice Bailey, LCSW [facility] Clinical Social Worker cell #: [phone]

## 2012-03-31 NOTE — Progress Notes (Signed)
[facility] PROGRESS NOTE  Kellie King ZOX:[id number] DOB: 06-Jun-1941 DOA: 03/28/2012 PCP: Cala Bradford, MD  Assessment/Plan: 1-E. Coli UTI (lower urinary tract infection): resistant to cipro. Will treat with cefuroxime base on sensitivity results for 5 days. Patient denies dysuria.  2-Diarrhea: C. Diff negative; final report on stool cx pending. Will discontinue empiric flagyl.  3-Hypokalemia: due to diarrhea episodes. Repleted  4-Dementia and mood disorder: stable; patient AAOX2; will continue treatment for mood disorder as prescribed prior to admission; continue supportive care.Marland Kitchen  5-Elevated LFTs: LFT's trending down. Unable to have MRCP due to implanted hip neuromodulator. Will follow CT abdomen as recommended by GI and will continue holding statins. Patient anicteric and currently asymptomatic and tolerating diet.  6-HTN: slight higher today; SBP in the 144 range, Will resume norvasc.  7-Thyroid: will continue synthroid; TSH WNL.  8-Deconditioning: per PT rec's HHPT at discharge. Will resume these services at her ALF.  DVT: lovenox   Code Status: Full Family Communication: no family at bedside Disposition Plan: Back to ALF today if CT no demonstrating significant abnormalities.   Brief narrative: 71 y/o female with PMH significant for  HTN, thyroid disease, hx of CVA, dementia and schizoafective disorder; admitted 2/2 abdominal pain and diarrhea. Also found with UTI and elevated LFT's.  Consultants:  None  Procedures:  Abd [place] (results pending)  CXR (no acute infiltrates)  Antibiotics:  Cipro and Flagyl  HPI/Subjective: No further episodes of diarrhea; patient denies N/V. Reports some difficulty drinking contrast for Abd CT due to contrast taste. Otherwise doing fine.  Objective: Filed Vitals:   03/30/12 0516 03/30/12 1410 03/30/12 2054 03/31/12 0648  BP: 128/75 131/76 128/73 144/83  Pulse: 94 98 90 77  Temp: 97.7 F (36.5 C) 98.1 F (36.7  C) 98.1 F (36.7 C) 97.3 F (36.3 C)  TempSrc: Oral Oral Oral Oral  Resp: 16 16 18 16   Height:      Weight:      SpO2: 95% 96% 97% 97%    Intake/Output Summary (Last 24 hours) at 03/31/12 0948 Last data filed at 03/30/12 1900  Gross per 24 hour  Intake   1032 ml  Output      0 ml  Net   1032 ml   Filed Weights   03/28/12 2323  Weight: 54.1 kg (119 lb 4.3 oz)    Exam:   General:  NAD; afebrile; no N/V  Cardiovascular: RRR; no murmurs, no rubs  Respiratory: CTA bilaterally  Abdomen: Soft, NT/ND; positive BS  Extremities: no E/C/C; no joint swelling or erythema  Neuro: non focal deficit  Data Reviewed: Basic Metabolic Panel:  Lab 03/31/12 8119 03/30/12 0410 03/29/12 0448 03/28/12 2240 03/28/12 1750  NA 140 142 139 -- 139  K 4.3 3.2* 3.7 4.2 5.6*  CL 106 108 103 -- 100  CO2 26 26 27  -- 21  GLUCOSE 76 112* 143* -- 148*  BUN 9 14 26* -- 21  CREATININE 0.59 0.63 0.88 -- 0.89  CALCIUM 8.6 8.2* 8.4 -- 8.9  MG -- 1.8 -- -- --  PHOS -- -- -- -- --   Liver Function Tests:  Lab 03/31/12 0426 03/29/12 0448 03/28/12 1750  AST 48* 381* 137*  ALT 84* 302* 47*  ALKPHOS 147* 224* 239*  BILITOT 0.3 0.5 0.4  PROT 5.8* 5.6* 6.3  ALBUMIN 3.0* 3.2* 3.9   CBC:  Lab 03/30/12 0410 03/29/12 0448 03/28/12 1750  WBC 6.9 8.9 8.7  NEUTROABS -- -- 7.7  HGB 11.1* 12.5 15.0  HCT 32.6* 37.9 45.5  MCV 91.3 92.2 92.3  PLT 141* 181 168   Cardiac Enzymes:  Lab 03/28/12 1750  CKTOTAL --  CKMB --  CKMBINDEX --  TROPONINI <0.30   CBG:  Lab 03/28/12 2139  GLUCAP 189*    Recent Results (from the past 240 hour(s))  CLOSTRIDIUM DIFFICILE BY PCR     Status: Normal   Collection Time   03/28/12  6:05 PM      Component Value Range Status Comment   C difficile by pcr NEGATIVE  NEGATIVE Final   URINE CULTURE     Status: Normal   Collection Time   03/28/12  8:11 PM      Component Value Range Status Comment   Specimen Description URINE, CLEAN CATCH   Final    Special Requests  NONE   Final    Culture  Setup Time 03/29/2012 01:28   Final    Colony Count >=100,000 COLONIES/ML   Final    Culture ESCHERICHIA COLI   Final    Report Status 03/31/2012 FINAL   Final    Organism ID, Bacteria ESCHERICHIA COLI   Final      Studies: US Abdomen Complete  03/29/2012  *RADIOLOGY REPORT*  Clinical Data:  Elevated liver function tests.  COMPLETE ABDOMINAL ULTRASOUND  Comparison:  CT abdomen pelvis 07/30/2004.  Findings:  Gallbladder:  Several shadowing echogenic stones are seen, measuring up to 1.3 cm.  Gallbladder wall measures 3 mm, within normal limits.  Negative sonographic Murphy's sign.  Common bile duct:  Measures 12 mm, dilated.  Liver:  Intrahepatic bile ducts appear prominent.  Echotexture may be slightly coarsened.  IVC:  Appears normal.  Pancreas:  Pancreatic duct measures up to 4 mm.  Visualization of the tail is limited by bowel gas.  Spleen:  Measures 5.2 cm, negative.  Right Kidney:  Measures 10.4 cm.  Parenchymal echogenicity is normal.  No hydronephrosis.  Left Kidney:  Measures 10.0 cm.  Parenchymal echogenicity is normal.  No hydronephrosis.  Abdominal aorta:  The distal aorta is obscured by bowel gas. Otherwise, no aneurysm identified.  IMPRESSION:  1.  Cholelithiasis without acute cholecystitis.  Dilated intrahepatic and extrahepatic bile ducts.  Non-visualized obstructing stone or obstructing lesion could create this appearance.  MRCP without and with contrast could be performed in further evaluation, as clinically indicated. 2.  Coarsened hepatic echotexture is indicative of steatosis. 3.  Minimally dilated pancreatic duct may relate to #1.   Original Report Authenticated By: Reyes Ivan, M.D.    Dg Chest Port 1 View  03/29/2012  *RADIOLOGY REPORT*  Clinical Data: Evaluate for infiltrate.  PORTABLE CHEST - 1 VIEW  Comparison: 05/26/2011  Findings: Single view of the chest was obtained.  Lungs are clear without airspace disease or edema. Heart and mediastinum are  within normal limits.  The trachea is midline.  Thorax is intact.  IMPRESSION: No acute chest findings.   Original Report Authenticated By: Richarda Overlie, M.D.    Dg Abd Acute W/chest  03/28/2012  *RADIOLOGY REPORT*  Clinical Data: Abdominal pain, weakness, shortness of breath  ACUTE ABDOMEN SERIES (ABDOMEN 2 VIEW & CHEST 1 VIEW)  Comparison: Chest radiographs dated 02/21/2011  Findings: Mild patchy right lower lobe opacity, likely atelectasis, pneumonia not excluded.  No pleural effusion or pneumothorax.  Cardiomediastinal silhouette is within normal limits.  Nonobstructive bowel gas pattern.  No evidence of free air on the lateral decubitus view.  Sacral stimulator.  Deformity of the left humeral head.  IMPRESSION: Mild patchy right lower lobe opacity, likely atelectasis, pneumonia not excluded.  No evidence of small bowel obstruction or free air.   Original Report Authenticated By: Charline Bills, M.D.     Scheduled Meds:    . aspirin  81 mg Oral Daily  . buPROPion  100 mg Oral BID  . cefUROXime  500 mg Oral BID WC  . ciprofloxacin  400 mg Intravenous Q12H  . enoxaparin (LOVENOX) injection  40 mg Subcutaneous QHS  . levothyroxine  50 mcg Oral QAC breakfast  . mirtazapine  15 mg Oral QHS  . pantoprazole  40 mg Oral Q1200  . potassium chloride  40 mEq Oral Q4H  . risperiDONE  3 mg Oral QHS  . venlafaxine XR  75 mg Oral BID  . DISCONTD: ciprofloxacin  500 mg Oral BID   Continuous Infusions:    . sodium chloride 20 mL/hr at 03/30/12 1054     Time spent: >30 minutes    Inaki Vantine  [facility] Pager [phone]. If 8PM-8AM, please contact night-coverage at [email or web address], password [facility] 03/31/2012, 9:48 AM  LOS: 3 days

## 2012-03-31 NOTE — Consult Note (Signed)
Reason for Consult: Cholelithiasis with elevated LFT's Referring Physician: Dr. Lynder Parents Kellie King is an 71 y.o. female.  HPI: Kellie King is a 71 y.o. female resident of an assisted living facility,with history of dementia, schizoaffective disorder and hypertension who presents with above complaints. history is obtained from ED staff, chart review and Limited contribution from patient. She reports that she's had lower abdominal pain for the past 2 days, unable to further characterize.she denies diarrhea but per facility she did have diarrhea and also in the ED had 2-3 episodes of diarrhea. Per nursing patient's diarrhea in ED was grossly nonbloody but a guaiac done was and came back positive.patient denies cough, fevers and no dysuria. She was seen in the ED and a urinalysis was done and was positive for UTI. Chest x-rayshowed a mild patchy right lower lobe opacity likely atelectasis pneumonia not excluded. As ready mentioned patient has no cough or fevers, also no shortness of breath.she is admitted for further evaluation and management.  We are being asked to see the patient on consult for findings of cholelithiasis and elevated LFT's. Which are resolving. Currently she is having no s/s of an acute cholecystitis.     Past Medical History  Diagnosis Date  . CVA (cerebral vascular accident)   . Dementia   . Schizo affective schizophrenia   . Hypertension   . Thyroid disease     Past Surgical History  Procedure Date  . Tubal ligation   . Interstitial cystitis     surgery for this  . Foot surgery     bilateral feet surgery on the bone  . Implantation / placement epidural neurostimulator electrodes     History reviewed. No pertinent family history.  Social History:  reports that she has never smoked. She has never used smokeless tobacco. She reports that she does not drink alcohol or use illicit drugs.  Allergies: No Known Allergies  Medications: I have reviewed the  patient's current medications.  Results for orders placed during the hospital encounter of 03/28/12 (from the past 48 hour(s))  LIPID PANEL     Status: Normal   Collection Time   03/30/12  4:10 AM      Component Value Range Comment   Cholesterol 120  0 - 200 mg/dL    Triglycerides 42  <981 mg/dL    HDL 58  >19 mg/dL    Total CHOL/HDL Ratio 2.1      VLDL 8  0 - 40 mg/dL    LDL Cholesterol 54  0 - 99 mg/dL   CBC     Status: Abnormal   Collection Time   03/30/12  4:10 AM      Component Value Range Comment   WBC 6.9  4.0 - 10.5 K/uL    RBC 3.57 (*) 3.87 - 5.11 MIL/uL    Hemoglobin 11.1 (*) 12.0 - 15.0 g/dL    HCT 14.7 (*) 82.9 - 46.0 %    MCV 91.3  78.0 - 100.0 fL    MCH 31.1  26.0 - 34.0 pg    MCHC 34.0  30.0 - 36.0 g/dL    RDW 56.2  13.0 - 86.5 %    Platelets 141 (*) 150 - 400 K/uL   BASIC METABOLIC PANEL     Status: Abnormal   Collection Time   03/30/12  4:10 AM      Component Value Range Comment   Sodium 142  135 - 145 mEq/L    Potassium 3.2 (*) 3.5 -  5.1 mEq/L    Chloride 108  96 - 112 mEq/L    CO2 26  19 - 32 mEq/L    Glucose, Bld 112 (*) 70 - 99 mg/dL    BUN 14  6 - 23 mg/dL    Creatinine, Ser 1.61  0.50 - 1.10 mg/dL    Calcium 8.2 (*) 8.4 - 10.5 mg/dL    GFR calc non Af Amer 87 (*) >90 mL/min    GFR calc Af Amer >90  >90 mL/min   MAGNESIUM     Status: Normal   Collection Time   03/30/12  4:10 AM      Component Value Range Comment   Magnesium 1.8  1.5 - 2.5 mg/dL   COMPREHENSIVE METABOLIC PANEL     Status: Abnormal   Collection Time   03/31/12  4:26 AM      Component Value Range Comment   Sodium 140  135 - 145 mEq/L    Potassium 4.3  3.5 - 5.1 mEq/L    Chloride 106  96 - 112 mEq/L    CO2 26  19 - 32 mEq/L    Glucose, Bld 76  70 - 99 mg/dL    BUN 9  6 - 23 mg/dL    Creatinine, Ser 0.96  0.50 - 1.10 mg/dL    Calcium 8.6  8.4 - 04.5 mg/dL    Total Protein 5.8 (*) 6.0 - 8.3 g/dL    Albumin 3.0 (*) 3.5 - 5.2 g/dL    AST 48 (*) 0 - 37 U/L    ALT 84 (*) 0 - 35 U/L      Alkaline Phosphatase 147 (*) 39 - 117 U/L    Total Bilirubin 0.3  0.3 - 1.2 mg/dL    GFR calc non Af Amer 89 (*) >90 mL/min    GFR calc Af Amer >90  >90 mL/min     Ct Abdomen Pelvis W Contrast  03/31/2012  *RADIOLOGY REPORT*  Clinical Data: Evaluate bile ducts.  Diarrhea.  CT ABDOMEN AND PELVIS WITH CONTRAST  Technique:  Multidetector CT imaging of the abdomen and pelvis was performed following the standard protocol during bolus administration of intravenous contrast.  Contrast: OMNIPAQUE IOHEXOL 300 MG/ML  SOLN the  Comparison: Ultrasound 03/21/2012, CT 07/30/2004  Findings: Lung bases are clear.  No pericardial fluid.  Gallbladder wall is thickened and there is mild pericholecystic fluid. Gallstones seen on comparison ultrasound are not well visualized by CT.  There is mild intrahepatic and extrahepatic biliary duct dilatation.  The common bile duct is upper limits of normal at 6 mm.  Pancreas appears normal.  Ganulomata within the spleen.  The adrenal glands and kidneys are normal.  There is a moderate hiatal hernia.  The small bowel and cecum are normal.  The ascending, transverse, and  descending colon are normal.  There is a circumferential thickening of the sigmoid colon and rectum.  There is mild pericolonic fat stranding in the presacral fat.  Abdominal aorta normal caliber.  No retroperitoneal periportal lymphadenopathy.  No peritoneal disease.  There is no free fluid in  the pelvis.  The uterus and bladder are normal.  No pelvic lymphadenopathy.  There is a stimulator device in the left flank.  No aggressive osseous lesions.  IMPRESSION:  1.  Thickened gallbladder wall with pericholecystic fluid and gallstones on ultrasound.  Recommend clinical correlation with acute cholecystitis and clinical consider nuclear medicine HIDA scan. 2.  Mild intrahepatic and extrahepatic biliary dilatation.  The  common bile duct is upper limits of normal 6 mm.  No significant pancreatic duct dilatation.  No  obstructing mass is identified. 3.  Diffuse circumferential bowel wall thickening of the sigmoid colon and rectum.  This suggests segmental colitis and with inflammatory bowel disease and infectious colitis most likely. Ischemic colitis and neoplasm are less favored.  4.  Moderate hiatal hernia.  This was made call report.   Original Report Authenticated By: Genevive Bi, M.D.     Review of Systems  Constitutional: Negative.   HENT: Negative.   Eyes: Negative.   Respiratory: Negative.   Cardiovascular: Negative.   Gastrointestinal: Positive for diarrhea and blood in stool. Negative for heartburn, nausea, vomiting, abdominal pain, constipation and melena.  Genitourinary: Positive for dysuria, urgency and frequency. Negative for flank pain.  Musculoskeletal: Negative.   Skin: Negative.   Neurological: Negative.   Endo/Heme/Allergies: Negative.   Psychiatric/Behavioral: Negative.    Blood pressure 144/83, pulse 77, temperature 97.3 F (36.3 C), temperature source Oral, resp. rate 16, height 5\' 6"  (1.676 m), weight 119 lb 4.3 oz (54.1 kg), SpO2 97.00%. Physical Exam  Constitutional: She is oriented to person, place, and time. She appears well-developed and well-nourished. No distress.  HENT:  Head: Normocephalic and atraumatic.  Mouth/Throat: No oropharyngeal exudate.  Eyes: Conjunctivae and EOM are normal. Pupils are equal, round, and reactive to light. Right eye exhibits no discharge. Left eye exhibits no discharge. No scleral icterus.  Neck: Normal range of motion. Neck supple. No JVD present. No tracheal deviation present. No thyromegaly present.  Cardiovascular: Normal rate, regular rhythm and normal heart sounds.  Exam reveals no gallop and no friction rub.   No murmur heard. Respiratory: Effort normal and breath sounds normal. No stridor. No respiratory distress. She has no wheezes. She has no rales. She exhibits no tenderness.  GI: Soft. Bowel sounds are normal. She exhibits no  distension and no mass. There is no tenderness. There is no rebound and no guarding.  Musculoskeletal: Normal range of motion. She exhibits no edema and no tenderness.  Lymphadenopathy:    She has no cervical adenopathy.  Neurological: She is alert and oriented to person, place, and time.  Skin: Skin is warm and dry. No rash noted. She is not diaphoretic. No erythema. No pallor.  Psychiatric: She has a normal mood and affect.    Assessment/Plan: Patient Active Problem List  Diagnosis  . UTI (lower urinary tract infection)  . Diarrhea  . Hyperkalemia  . Dementia  . Elevated LFTs  . Steatosis of liver  . Anomalies of gallbladder, bile ducts, and liver  . Dilated bile duct  . Cholelithiases   Plan: Since patient has no clinical s/s of acute cholecystitis, it is our recommendation that she continue with her current treatment of her UTI, and optimizing of her other medical problems. After which upon discharge if her PCP feels that she needs a surgical consult we would be happy to re-evaluate her condition and needs at that time. I have discussed my findings with Dr. Michaell Cowing and he is aware of these findings and my earlier conversation with Dr. Gwenlyn Perking concerning this plan. Additionally the patient herself has been apprised of this plan and has given verbal understanding of and is in agreement with this plan.  Thank you very much for the consult, but at this time no surgical intervention is indicated, if this changes during her stay in the hospital, we will re-evaluate as needed.  Sabre Romberger 03/31/2012, 4:09 PM

## 2012-03-31 NOTE — Discharge Summary (Signed)
Physician Discharge Summary  DA MICHELLE NWG:[id number] DOB: 1941/01/17 DOA: 03/28/2012  PCP: Cala Bradford, MD  Admit date: 03/28/2012 Discharge date: 03/31/2012  Recommendations for Outpatient Follow-up:  1. Follow up with PCP in 2 weeks (will be important to follow resolution of patient symptoms and to recheck CMET to follow electrolytes and also LFT's. Patient will required outpatient evaluation by surgery for eventually elective cholecystectomy; also will require follow up with GI to determine if she will need a colonoscopy)   Discharge Diagnoses:  Active Problems:  UTI (lower urinary tract infection)  Diarrhea  Hyperkalemia  Dementia  Elevated LFTs  Steatosis of liver  Anomalies of gallbladder, bile ducts, and liver  Dilated bile duct  Cholelithiases   Discharge Condition: stable and improved. Will be discharge back to ALF for supportive care, to finish oral antibiotics and will follow with PCP in 2 weeks.  Diet recommendation: low fat diet and heart healthy diet (low sodium)  Filed Weights   03/28/12 2323  Weight: 54.1 kg (119 lb 4.3 oz)    History of present illness:  71 y/o female with PMH significant for HTN, thyroid disease, hx of CVA, dementia and schizoafective disorder; admitted 2/2 suprapubic abdominal pain, mild dehydration and some diarrhea. Also found with UTI and elevated LFT's.   Hospital Course:  1-E. Coli UTI (lower urinary tract infection): resistant to cipro. Will treat with cefuroxime base on sensitivity results for 6 days. Patient denies dysuria at discharge, good urine output. Advised to keep herself well hydrated.   2-Diarrhea: C. Diff negative; no further episodes of diarrhea while in the hospital, most likely secondary to enteritis.  CT demonstrated some changes in her colon and after discussing with GI will benefit of outpatient colonoscopy at some point if is something that they will like to pursuit. PCP to discuss with patient and family  members and arrange GI visit as an outpatient if needed.  3-Hypokalemia: due to diarrhea episodes prior to admission. Repleted and WNL at discharge.  4-Dementia and mood disorder: stable; patient AAOX2; will continue treatment for mood disorder as prescribed prior to admission; continue supportive care.. Will d/c to ALF.  5-Elevated LFTs: LFT's trending down. Unable to have MRCP due to implanted hip neuromodulator. CT abdomen demonstrating no significant CBD or pancreatic duct dilatation; gallbladder findings suggesting that she probably has passed some gallstones. Per GI and General surgery, since patient is asymptomatic will defer for elective outpatient evaluation for cholecystectomy at this point. Will recommend low fat diet and will discontinue statins due to steatosis. Patient tolerating full diet and completely asymptomatic.  6-HTN: slight higher today; SBP in the 144 range, Will resume norvasc.   7-Thyroid: will continue synthroid; TSH WNL.   8-Deconditioning: per PT rec's HHPT at discharge. Will resume these services at her ALF.    Procedures:  CT abdomen  Abd [place]  Consultations:  CC[facility]  GI  Discharge Exam: Filed Vitals:   03/31/12 0648  BP: 144/83  Pulse: 77  Temp: 97.3 F (36.3 C)  Resp: 16   Filed Vitals:   03/30/12 0516 03/30/12 1410 03/30/12 2054 03/31/12 0648  BP: 128/75 131/76 128/73 144/83  Pulse: 94 98 90 77  Temp: 97.7 F (36.5 C) 98.1 F (36.7 C) 98.1 F (36.7 C) 97.3 F (36.3 C)  TempSrc: Oral Oral Oral Oral  Resp: 16 16 18 16   Height:      Weight:      SpO2: 95% 96% 97% 97%    General: NAD, no nausea  or vomiting; denies abdominal pain Cardiovascular: Regular rate, no rubs or gallops Respiratory: CTA bilaterally Abdomen: soft, NT/ND, positive BS; no guarding Neuro: no focal deficit  Discharge Instructions  Discharge Orders    Future Appointments: Provider: Department: Dept Phone: Center:   03/31/2012 11:30 PM [facility]  [phone] [place]     Future Orders Please Complete By Expires   Diet - low sodium heart healthy      Home Health      Scheduling Instructions:   Please resume home health PT services   Questions: Responses:   To provide the following care/treatments PT   Discharge instructions      Comments:   -Low fat diet -Take medications as prescribed -Follow with PCP in 2 weeks-Keep patient well hydrated.     Medication List  As of 03/31/2012  3:58 PM   STOP taking these medications         lisinopril 5 MG tablet      pravastatin 40 MG tablet         TAKE these medications         amLODipine 2.5 MG tablet   Commonly known as: NORVASC   Take 2 tablets (5 mg total) by mouth daily.      aspirin 81 MG chewable tablet   Chew 81 mg by mouth daily.      buPROPion 100 MG 12 hr tablet   Commonly known as: WELLBUTRIN SR   Take 100 mg by mouth 2 (two) times daily.      cefUROXime 500 MG tablet   Commonly known as: CEFTIN   Take 1 tablet (500 mg total) by mouth 2 (two) times daily with a meal.      levothyroxine 50 MCG tablet   Commonly known as: SYNTHROID, LEVOTHROID   Take 50 mcg by mouth daily.      mirtazapine 15 MG tablet   Commonly known as: REMERON   Take 15 mg by mouth at bedtime.      pantoprazole 40 MG tablet   Commonly known as: PROTONIX   Take 1 tablet (40 mg total) by mouth daily at 12 noon.      risperiDONE 3 MG tablet   Commonly known as: RISPERDAL   Take 3 mg by mouth at bedtime.      venlafaxine XR 75 MG 24 hr capsule   Commonly known as: EFFEXOR-XR   Take 75 mg by mouth 2 (two) times daily.           Follow-up Information    Follow up with Cala Bradford, MD in 2 weeks.   Contact information:   [address] [place] [address] [phone]           The results of significant diagnostics from this hospitalization (including imaging, microbiology, ancillary and laboratory) are listed below for reference.     Significant Diagnostic Studies: US Abdomen Complete  03/29/2012  *RADIOLOGY REPORT*  Clinical Data:  Elevated liver function tests.  COMPLETE ABDOMINAL ULTRASOUND  Comparison:  CT abdomen pelvis 07/30/2004.  Findings:  Gallbladder:  Several shadowing echogenic stones are seen, measuring up to 1.3 cm.  Gallbladder wall measures 3 mm, within normal limits.  Negative sonographic Murphy's sign.  Common bile duct:  Measures 12 mm, dilated.  Liver:  Intrahepatic bile ducts appear prominent.  Echotexture may be slightly coarsened.  IVC:  Appears normal.  Pancreas:  Pancreatic duct measures up to 4 mm.  Visualization of the tail is  limited by bowel gas.  Spleen:  Measures 5.2 cm, negative.  Right Kidney:  Measures 10.4 cm.  Parenchymal echogenicity is normal.  No hydronephrosis.  Left Kidney:  Measures 10.0 cm.  Parenchymal echogenicity is normal.  No hydronephrosis.  Abdominal aorta:  The distal aorta is obscured by bowel gas. Otherwise, no aneurysm identified.  IMPRESSION:  1.  Cholelithiasis without acute cholecystitis.  Dilated intrahepatic and extrahepatic bile ducts.  Non-visualized obstructing stone or obstructing lesion could create this appearance.  MRCP without and with contrast could be performed in further evaluation, as clinically indicated. 2.  Coarsened hepatic echotexture is indicative of steatosis. 3.  Minimally dilated pancreatic duct may relate to #1.   Original Report Authenticated By: Reyes Ivan, M.D.    Ct Abdomen Pelvis W Contrast  03/31/2012  *RADIOLOGY REPORT*  Clinical Data: Evaluate bile ducts.  Diarrhea.  CT ABDOMEN AND PELVIS WITH CONTRAST  Technique:  Multidetector CT imaging of the abdomen and pelvis was performed following the standard protocol during bolus administration of intravenous contrast.  Contrast: OMNIPAQUE IOHEXOL 300 MG/ML  SOLN the  Comparison: Ultrasound 03/21/2012, CT 07/30/2004  Findings: Lung bases are clear.  No pericardial fluid.  Gallbladder wall is  thickened and there is mild pericholecystic fluid. Gallstones seen on comparison ultrasound are not well visualized by CT.  There is mild intrahepatic and extrahepatic biliary duct dilatation.  The common bile duct is upper limits of normal at 6 mm.  Pancreas appears normal.  Ganulomata within the spleen.  The adrenal glands and kidneys are normal.  There is a moderate hiatal hernia.  The small bowel and cecum are normal.  The ascending, transverse, and  descending colon are normal.  There is a circumferential thickening of the sigmoid colon and rectum.  There is mild pericolonic fat stranding in the presacral fat.  Abdominal aorta normal caliber.  No retroperitoneal periportal lymphadenopathy.  No peritoneal disease.  There is no free fluid in  the pelvis.  The uterus and bladder are normal.  No pelvic lymphadenopathy.  There is a stimulator device in the left flank.  No aggressive osseous lesions.  IMPRESSION:  1.  Thickened gallbladder wall with pericholecystic fluid and gallstones on ultrasound.  Recommend clinical correlation with acute cholecystitis and clinical consider nuclear medicine HIDA scan. 2.  Mild intrahepatic and extrahepatic biliary dilatation.  The common bile duct is upper limits of normal 6 mm.  No significant pancreatic duct dilatation.  No obstructing mass is identified. 3.  Diffuse circumferential bowel wall thickening of the sigmoid colon and rectum.  This suggests segmental colitis and with inflammatory bowel disease and infectious colitis most likely. Ischemic colitis and neoplasm are less favored.  4.  Moderate hiatal hernia.  This was made call report.   Original Report Authenticated By: Genevive Bi, M.D.    Dg Chest Port 1 View  03/29/2012  *RADIOLOGY REPORT*  Clinical Data: Evaluate for infiltrate.  PORTABLE CHEST - 1 VIEW  Comparison: 05/26/2011  Findings: Single view of the chest was obtained.  Lungs are clear without airspace disease or edema. Heart and mediastinum are within  normal limits.  The trachea is midline.  Thorax is intact.  IMPRESSION: No acute chest findings.   Original Report Authenticated By: Richarda Overlie, M.D.    Dg Abd Acute W/chest  03/28/2012  *RADIOLOGY REPORT*  Clinical Data: Abdominal pain, weakness, shortness of breath  ACUTE ABDOMEN SERIES (ABDOMEN 2 VIEW & CHEST 1 VIEW)  Comparison: Chest radiographs dated 02/21/2011  Findings: Mild patchy right lower lobe opacity, likely atelectasis, pneumonia not excluded.  No pleural effusion or pneumothorax.  Cardiomediastinal silhouette is within normal limits.  Nonobstructive bowel gas pattern.  No evidence of free air on the lateral decubitus view.  Sacral stimulator.  Deformity of the left humeral head.  IMPRESSION: Mild patchy right lower lobe opacity, likely atelectasis, pneumonia not excluded.  No evidence of small bowel obstruction or free air.   Original Report Authenticated By: Charline Bills, M.D.     Microbiology: Recent Results (from the past 240 hour(s))  CLOSTRIDIUM DIFFICILE BY PCR     Status: Normal   Collection Time   03/28/12  6:05 PM      Component Value Range Status Comment   C difficile by pcr NEGATIVE  NEGATIVE Final   URINE CULTURE     Status: Normal   Collection Time   03/28/12  8:11 PM      Component Value Range Status Comment   Specimen Description URINE, CLEAN CATCH   Final    Special Requests NONE   Final    Culture  Setup Time 03/29/2012 01:28   Final    Colony Count >=100,000 COLONIES/ML   Final    Culture ESCHERICHIA COLI   Final    Report Status 03/31/2012 FINAL   Final    Organism ID, Bacteria ESCHERICHIA COLI   Final      Labs: Basic Metabolic Panel:  Lab 03/31/12 7829 03/30/12 0410 03/29/12 0448 03/28/12 2240 03/28/12 1750  NA 140 142 139 -- 139  K 4.3 3.2* 3.7 4.2 5.6*  CL 106 108 103 -- 100  CO2 26 26 27  -- 21  GLUCOSE 76 112* 143* -- 148*  BUN 9 14 26* -- 21  CREATININE 0.59 0.63 0.88 -- 0.89  CALCIUM 8.6 8.2* 8.4 -- 8.9  MG -- 1.8 -- -- --  PHOS -- --  -- -- --   Liver Function Tests:  Lab 03/31/12 0426 03/29/12 0448 03/28/12 1750  AST 48* 381* 137*  ALT 84* 302* 47*  ALKPHOS 147* 224* 239*  BILITOT 0.3 0.5 0.4  PROT 5.8* 5.6* 6.3  ALBUMIN 3.0* 3.2* 3.9   CBC:  Lab 03/30/12 0410 03/29/12 0448 03/28/12 1750  WBC 6.9 8.9 8.7  NEUTROABS -- -- 7.7  HGB 11.1* 12.5 15.0  HCT 32.6* 37.9 45.5  MCV 91.3 92.2 92.3  PLT 141* 181 168   Cardiac Enzymes:  Lab 03/28/12 1750  CKTOTAL --  CKMB --  CKMBINDEX --  TROPONINI <0.30   CBG:  Lab 03/28/12 2139  GLUCAP 189*    Time coordinating discharge: >30 minutes  Signed:  Mccauley Diehl  [facility] 03/31/2012, 3:58 PM

## 2012-03-31 NOTE — Progress Notes (Signed)
[facility] Progress Note  Subjective:  Feels ok.  Says no diarrhea (today she says that she never had diarrhea).  Trying to drink the last of her CT contrast; making her a little nauseated.  Objective:  Vital signs in last 24 hours: Temp:  [97.3 F (36.3 C)-98.1 F (36.7 C)] 97.3 F (36.3 C) (08/30 0648) Pulse Rate:  [77-98] 77  (08/30 0648) Resp:  [16-18] 16  (08/30 0648) BP: (128-144)/(73-83) 144/83 mmHg (08/30 0648) SpO2:  [96 %-97 %] 97 % (08/30 0648) Last BM Date: 03/30/12 General:   Alert, Well-developed, in NAD Heart:  Regular rate and rhythm; no murmurs Pulm:  CTAB. No W/R/R. Abdomen:  Soft, nondistended. Normal bowel sounds.  Express minimal diffuse TTP. Extremities:  Without edema. Neurologic:  Alert;  grossly normal neurologically. Psych:  Alert and cooperative. Normal mood and affect.  Intake/Output from previous day: 08/29 0701 - 08/30 0700 In: 1272 [P.O.:720; I.V.:552] Out: -   Lab Results:  Basename 03/30/12 0410 03/29/12 0448 03/28/12 1750  WBC 6.9 8.9 8.7  HGB 11.1* 12.5 15.0  HCT 32.6* 37.9 45.5  PLT 141* 181 168   BMET  Basename 03/31/12 0426 03/30/12 0410 03/29/12 0448  NA 140 142 139  K 4.3 3.2* 3.7  CL 106 108 103  CO2 26 26 27   GLUCOSE 76 112* 143*  BUN 9 14 26*  CREATININE 0.59 0.63 0.88  CALCIUM 8.6 8.2* 8.4   LFT  Basename 03/31/12 0426  PROT 5.8*  ALBUMIN 3.0*  AST 48*  ALT 84*  ALKPHOS 147*  BILITOT 0.3  BILIDIR --  IBILI --   Hepatitis Panel  Basename 03/29/12 0448  HEPBSAG NEGATIVE  HCVAB NEGATIVE  HEPAIGM NEGATIVE  HEPBIGM NEGATIVE   Assessment / Plan: -Abnormal LFT's with dilated intra- and extra-hepatic ducts on ultrasound-LFT's significantly decreased today-?passed gallstone?  MRI cancelled due to neurostimulator in pelvis.  CT ordered instead.  Will follow-up those results.  If patient would need a procedure I will contact her daughter, Darrel Reach at [phone], to obtain consent. -Cholelithiasis   -Diarrhea-improving. Cdiff negative.  -Abdominal pain-lower abdomen-? Related to diarrhea vs UTI  -UTI-on cipro per primary service.    LOS: 3 days   ZEHR, JESSICA D.  03/31/2012, 9:16 AM  Pager number [phone]    [facility] Attending  I have also seen and assessed the patient and agree with the above note. Confusing picture as history is very unreliable with dementia. CT showed changes suggesting cholecystitis but she does not have sxs, bile ducts are not dilated. ? If she passed a stone.  Thickened rectum and sigmoid - ? Colitis - dhe may have had diarrhea so could have had infectious diarrhea. No endoscopic intervention planned as she is improved overall. Treat UTI Follow-up LFT's. If she has more signs/sxs please call back - signing off.  Iva Boop, MD, [facility] [phone]

## 2012-03-31 NOTE — Progress Notes (Signed)
Report called to [employer]. Discharge instructions reviewed. All questions answered. Julio Sicks RN

## 2012-03-31 NOTE — Consult Note (Signed)
UTI being Tx'd No pain this admit BM 2 days ago No emesis Incidental gallstones w/o evidence of biliary colic CT scan a little concerning but patient has no pain/N/V/discomfort on exam & eating normally.   Studies conflicting in that: LFTs now better but CT "worse" GS but better CBD than 2d ago when U/S showed incidental GS but CBD dilated & LFTs worse.  No fever/WBC. Fair OR candidate - would wish to medically optimize 1 st anyway Agree to Tx UTI but not rush to do chole unless has post-parandial pain/N/V/fevers/inc WBC in absence of other infection, etc

## 2012-05-02 ENCOUNTER — Encounter: Payer: Self-pay | Admitting: Internal Medicine

## 2012-05-18 ENCOUNTER — Ambulatory Visit (INDEPENDENT_AMBULATORY_CARE_PROVIDER_SITE_OTHER): Payer: Medicare Other | Admitting: General Surgery

## 2012-05-18 ENCOUNTER — Encounter (INDEPENDENT_AMBULATORY_CARE_PROVIDER_SITE_OTHER): Payer: Self-pay | Admitting: General Surgery

## 2012-05-18 VITALS — BP 130/74 | HR 76 | Temp 98.2°F | Resp 16 | Ht 66.0 in

## 2012-05-18 DIAGNOSIS — K8020 Calculus of gallbladder without cholecystitis without obstruction: Secondary | ICD-10-CM

## 2012-05-18 NOTE — Progress Notes (Signed)
Patient ID: Kellie King, female   DOB: [birth year], 71 y.o.   MRN: [id number]  Chief Complaint  Patient presents with  . Pre-op Exam    eval GB w/stones    HPI Kellie King is a 71 y.o. female.  This patient was not aware of the reason for her consultation today. From what I gather, she is here to evaluate for possible cholecystectomy given her history of gallstones. She was admitted to the hospital in August for some diarrhea in she was found to have some abnormal LFTs and some cholelithiasis and she had some gallbladder thickening as well as on her CT scan. Dr. Michaell Cowing evaluate her for possible cholecystectomy but he did not feel that this was necessary at that time. Upon questioning today, she denies any abdominal pain, nausea, vomiting, diarrhea, food intolerance, fever, or chills. She says that she is not losing weight and is eating without difficulty and has no complaints. HPI  Past Medical History  Diagnosis Date  . CVA (cerebral vascular accident)   . Dementia   . Schizo affective schizophrenia   . Hypertension   . Thyroid disease     Past Surgical History  Procedure Date  . Tubal ligation   . Interstitial cystitis     surgery for this  . Foot surgery     bilateral feet surgery on the bone  . Implantation / placement epidural neurostimulator electrodes     History reviewed. No pertinent family history.  Social History History  Substance Use Topics  . Smoking status: Never Smoker   . Smokeless tobacco: Never Used  . Alcohol Use: No    No Known Allergies  Current Outpatient Prescriptions  Medication Sig Dispense Refill  . amLODipine (NORVASC) 2.5 MG tablet Take 2 tablets (5 mg total) by mouth daily.  30 tablet  1  . aspirin 81 MG chewable tablet Chew 81 mg by mouth daily.        Kellie King buPROPion (WELLBUTRIN SR) 100 MG 12 hr tablet Take 100 mg by mouth 2 (two) times daily.        Kellie King levothyroxine (SYNTHROID, LEVOTHROID) 50 MCG tablet Take 50 mcg by mouth daily.         . mirtazapine (REMERON) 15 MG tablet Take 15 mg by mouth at bedtime.        . pantoprazole (PROTONIX) 40 MG tablet Take 1 tablet (40 mg total) by mouth daily at 12 noon.  30 tablet  1  . risperiDONE (RISPERDAL) 3 MG tablet Take 3 mg by mouth at bedtime.      Kellie King venlafaxine (EFFEXOR-XR) 75 MG 24 hr capsule Take 75 mg by mouth 2 (two) times daily.         Review of Systems Review of Systems All other review of systems negative or noncontributory except as stated in the HPI  Blood pressure 130/74, pulse 76, temperature 98.2 F (36.8 C), temperature source Temporal, resp. rate 16, height 5\' 6"  (1.676 m).  Physical Exam Physical Exam  Vitals reviewed. Constitutional: She is oriented to person, place, and time. She appears well-developed. No distress.  HENT:  Head: Normocephalic and atraumatic.  Eyes: Pupils are equal, round, and reactive to light. No scleral icterus.  Neck: Normal range of motion. No tracheal deviation present.  Cardiovascular: Normal rate and regular rhythm.   Pulmonary/Chest: Effort normal and breath sounds normal. No stridor. No respiratory distress. She has no wheezes.  Abdominal: Soft. Bowel sounds are normal. She exhibits no distension. There  is no tenderness. There is no rebound and no guarding.  Musculoskeletal: She exhibits no edema and no tenderness.       She is in a wheelchair  Neurological: She is alert and oriented to person, place, and time.  Skin: Skin is warm and dry. She is not diaphoretic. No erythema.  Psychiatric: She has a normal mood and affect. Her behavior is normal. Judgment and thought content normal.    Data Reviewed [place], CT, labs  Assessment    Cholelithiasis without evidence of symptoms She does have cholelithiasis on her imaging but upon questioning today, and I cannot find any symptoms which could be attributable to her gallstones. She says she is feeling well without any abdominal pain or food intolerance. She does have a history  of some elevated LFTs and I have not seen any repeat LFTs since her hospitalization. I would recommend repeating a set of LFTs to insure normalization but otherwise I would not  recommend cholecystectomy at this time since the patient is asymptomatic. If her recent hospitalization was due to passing of gallstones, then she may have further episodes of this and I explained this to her that she is not interested in cholecystectomy at this time.      Plan    Repeat LFTs and otherwise resume activity as tolerated and if she has any symptoms of abdominal pain or food intolerance or nausea then we would consider cholecystectomy in the future but at this time, she is asymptomatic and we will continue with watchful waiting and observation       Kellie King 05/18/2012, 11:54 AM

## 2012-06-13 ENCOUNTER — Encounter: Payer: Self-pay | Admitting: Internal Medicine

## 2012-06-13 ENCOUNTER — Ambulatory Visit (INDEPENDENT_AMBULATORY_CARE_PROVIDER_SITE_OTHER): Payer: Medicare Other | Admitting: Internal Medicine

## 2012-06-13 VITALS — BP 100/60 | HR 80 | Wt 128.0 lb

## 2012-06-13 DIAGNOSIS — R933 Abnormal findings on diagnostic imaging of other parts of digestive tract: Secondary | ICD-10-CM

## 2012-06-13 NOTE — Progress Notes (Signed)
Patient ID: Kellie King, female   DOB: 24-Jul-1941, 71 y.o.   MRN: [id number]   This elderly demented white woman was seen in the hospital in August. She had diarrhea and CT scan imaging showing a thickened rectum and sigmoid colon. She's setback by primary care to see if further evaluation is needed. She has no GI symptoms at this time. The diarrhea is completely resolved.  She is here with a caregiver from the facility she lives in. The patient is awake and alert but she is not oriented at all.  Medications, allergies, past medical history, past surgical history, family history and social history are reviewed and updated in the EMR.   1. Abnormal CT scan, colon    1. I don't think further evaluation is necessary. I think she may have had a colonoscopy by Dr. 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 2. I would not pursue any further investigation and she can be seen as needed know if she has been establish with Dr. Madilyn Fireman in the past I recommend she return there.   CC: Cala Bradford, MD

## 2012-06-13 NOTE — Patient Instructions (Addendum)
You were seen in the hospital for a thickened rectum and colon found  on your CT scan.  Since diarrhea is gone no further evaluation is needed.  Thank you for choosing me and [facility].  Iva Boop, M.D., [facility]

## 2013-04-05 ENCOUNTER — Emergency Department (HOSPITAL_COMMUNITY): Payer: Medicare Other

## 2013-04-05 ENCOUNTER — Encounter (HOSPITAL_COMMUNITY): Payer: Self-pay | Admitting: *Deleted

## 2013-04-05 ENCOUNTER — Inpatient Hospital Stay (HOSPITAL_COMMUNITY)
Admission: EM | Admit: 2013-04-05 | Discharge: 2013-04-17 | DRG: 871 | Disposition: A | Discharge status: Skilled Nursing Facility | Payer: Medicare Other | Attending: Internal Medicine | Admitting: Internal Medicine

## 2013-04-05 DIAGNOSIS — Q441 Other congenital malformations of gallbladder: Secondary | ICD-10-CM

## 2013-04-05 DIAGNOSIS — F259 Schizoaffective disorder, unspecified: Secondary | ICD-10-CM | POA: Diagnosis present

## 2013-04-05 DIAGNOSIS — K760 Fatty (change of) liver, not elsewhere classified: Secondary | ICD-10-CM

## 2013-04-05 DIAGNOSIS — Z681 Body mass index (BMI) 19 or less, adult: Secondary | ICD-10-CM

## 2013-04-05 DIAGNOSIS — F0390 Unspecified dementia without behavioral disturbance: Secondary | ICD-10-CM

## 2013-04-05 DIAGNOSIS — E872 Acidosis, unspecified: Secondary | ICD-10-CM | POA: Diagnosis present

## 2013-04-05 DIAGNOSIS — Z66 Do not resuscitate: Secondary | ICD-10-CM | POA: Diagnosis present

## 2013-04-05 DIAGNOSIS — G309 Alzheimer's disease, unspecified: Secondary | ICD-10-CM | POA: Diagnosis present

## 2013-04-05 DIAGNOSIS — R739 Hyperglycemia, unspecified: Secondary | ICD-10-CM | POA: Diagnosis present

## 2013-04-05 DIAGNOSIS — R7989 Other specified abnormal findings of blood chemistry: Secondary | ICD-10-CM

## 2013-04-05 DIAGNOSIS — F0280 Dementia in other diseases classified elsewhere without behavioral disturbance: Secondary | ICD-10-CM | POA: Diagnosis present

## 2013-04-05 DIAGNOSIS — A419 Sepsis, unspecified organism: Principal | ICD-10-CM | POA: Diagnosis present

## 2013-04-05 DIAGNOSIS — E870 Hyperosmolality and hypernatremia: Secondary | ICD-10-CM | POA: Diagnosis present

## 2013-04-05 DIAGNOSIS — E46 Unspecified protein-calorie malnutrition: Secondary | ICD-10-CM | POA: Diagnosis present

## 2013-04-05 DIAGNOSIS — D649 Anemia, unspecified: Secondary | ICD-10-CM | POA: Diagnosis present

## 2013-04-05 DIAGNOSIS — M2459 Contracture, other specified joint: Secondary | ICD-10-CM | POA: Diagnosis present

## 2013-04-05 DIAGNOSIS — Z515 Encounter for palliative care: Secondary | ICD-10-CM

## 2013-04-05 DIAGNOSIS — G9340 Encephalopathy, unspecified: Secondary | ICD-10-CM | POA: Diagnosis present

## 2013-04-05 DIAGNOSIS — K8020 Calculus of gallbladder without cholecystitis without obstruction: Secondary | ICD-10-CM

## 2013-04-05 DIAGNOSIS — E119 Type 2 diabetes mellitus without complications: Secondary | ICD-10-CM | POA: Diagnosis present

## 2013-04-05 DIAGNOSIS — R627 Adult failure to thrive: Secondary | ICD-10-CM

## 2013-04-05 DIAGNOSIS — E871 Hypo-osmolality and hyponatremia: Secondary | ICD-10-CM | POA: Diagnosis present

## 2013-04-05 DIAGNOSIS — I69998 Other sequelae following unspecified cerebrovascular disease: Secondary | ICD-10-CM

## 2013-04-05 DIAGNOSIS — R1310 Dysphagia, unspecified: Secondary | ICD-10-CM | POA: Diagnosis present

## 2013-04-05 DIAGNOSIS — R4182 Altered mental status, unspecified: Secondary | ICD-10-CM

## 2013-04-05 DIAGNOSIS — N390 Urinary tract infection, site not specified: Secondary | ICD-10-CM | POA: Diagnosis present

## 2013-04-05 DIAGNOSIS — D72829 Elevated white blood cell count, unspecified: Secondary | ICD-10-CM | POA: Diagnosis present

## 2013-04-05 DIAGNOSIS — N179 Acute kidney failure, unspecified: Secondary | ICD-10-CM | POA: Diagnosis present

## 2013-04-05 LAB — BASIC METABOLIC PANEL
CO2: 19 mEq/L (ref 19–32)
CO2: 22 mEq/L (ref 19–32)
Calcium: 8 mg/dL — ABNORMAL LOW (ref 8.4–10.5)
Chloride: 123 mEq/L — ABNORMAL HIGH (ref 96–112)
Potassium: 3.9 mEq/L (ref 3.5–5.1)
Potassium: 4.5 mEq/L (ref 3.5–5.1)
Sodium: 163 mEq/L (ref 135–145)
Sodium: 164 mEq/L (ref 135–145)

## 2013-04-05 LAB — CBC
Hemoglobin: 14.9 g/dL (ref 12.0–15.0)
MCH: 31.8 pg (ref 26.0–34.0)
Platelets: 138 10*3/uL — ABNORMAL LOW (ref 150–400)
RBC: 4.69 MIL/uL (ref 3.87–5.11)
WBC: 19.6 10*3/uL — ABNORMAL HIGH (ref 4.0–10.5)

## 2013-04-05 LAB — HEPATIC FUNCTION PANEL
ALT: 18 U/L (ref 0–35)
Albumin: 3.6 g/dL (ref 3.5–5.2)
Alkaline Phosphatase: 92 U/L (ref 39–117)
Total Protein: 6.6 g/dL (ref 6.0–8.3)

## 2013-04-05 LAB — CBC WITH DIFFERENTIAL/PLATELET
Basophils Absolute: 0 10*3/uL (ref 0.0–0.1)
Lymphocytes Relative: 6 % — ABNORMAL LOW (ref 12–46)
Neutro Abs: 17.9 10*3/uL — ABNORMAL HIGH (ref 1.7–7.7)
Platelets: 149 10*3/uL — ABNORMAL LOW (ref 150–400)
RDW: 14.1 % (ref 11.5–15.5)
WBC: 19.6 10*3/uL — ABNORMAL HIGH (ref 4.0–10.5)

## 2013-04-05 LAB — POCT I-STAT, CHEM 8
Creatinine, Ser: 2.6 mg/dL — ABNORMAL HIGH (ref 0.50–1.10)
HCT: 48 % — ABNORMAL HIGH (ref 36.0–46.0)
Hemoglobin: 16.3 g/dL — ABNORMAL HIGH (ref 12.0–15.0)
Potassium: 3.8 mEq/L (ref 3.5–5.1)
Sodium: 166 mEq/L (ref 135–145)
TCO2: 22 mmol/L (ref 0–100)

## 2013-04-05 LAB — URINE MICROSCOPIC-ADD ON

## 2013-04-05 LAB — MAGNESIUM: Magnesium: 3.1 mg/dL — ABNORMAL HIGH (ref 1.5–2.5)

## 2013-04-05 LAB — URINALYSIS, ROUTINE W REFLEX MICROSCOPIC
Glucose, UA: NEGATIVE mg/dL
Protein, ur: 30 mg/dL — AB
Urobilinogen, UA: 1 mg/dL (ref 0.0–1.0)

## 2013-04-05 LAB — GLUCOSE, CAPILLARY: Glucose-Capillary: 174 mg/dL — ABNORMAL HIGH (ref 70–99)

## 2013-04-05 LAB — PRO B NATRIURETIC PEPTIDE: Pro B Natriuretic peptide (BNP): 409.8 pg/mL — ABNORMAL HIGH (ref 0–125)

## 2013-04-05 LAB — CG4 I-STAT (LACTIC ACID): Lactic Acid, Venous: 3.02 mmol/L — ABNORMAL HIGH (ref 0.5–2.2)

## 2013-04-05 LAB — PROTIME-INR: Prothrombin Time: 14.6 seconds (ref 11.6–15.2)

## 2013-04-05 MED ORDER — SODIUM CHLORIDE 0.9 % IV BOLUS (SEPSIS)
1000.0000 mL | Freq: Once | INTRAVENOUS | Status: AC
  Administered 2013-04-05: 1000 mL via INTRAVENOUS

## 2013-04-05 MED ORDER — DEXTROSE 5 % IV SOLN
Freq: Once | INTRAVENOUS | Status: AC
  Administered 2013-04-05: 17:00:00 via INTRAVENOUS

## 2013-04-05 MED ORDER — ONDANSETRON HCL 4 MG/2ML IJ SOLN: 4.0000 mg | Freq: Three times a day (TID) | INTRAMUSCULAR | Status: AC | PRN

## 2013-04-05 MED ORDER — DEXTROSE 5 % IV SOLN
1.0000 g | Freq: Once | INTRAVENOUS | Status: AC
  Administered 2013-04-05: 1 g via INTRAVENOUS
  Filled 2013-04-05: qty 10

## 2013-04-05 MED ORDER — DEXTROSE 5 % IV SOLN
1.0000 g | INTRAVENOUS | Status: DC
  Administered 2013-04-06 – 2013-04-08 (×3): 1 g via INTRAVENOUS
  Filled 2013-04-05 (×5): qty 10

## 2013-04-05 MED ORDER — RISPERIDONE 3 MG PO TABS
3.0000 mg | ORAL_TABLET | Freq: Every day | ORAL | Status: DC
  Administered 2013-04-06: 3 mg via ORAL
  Filled 2013-04-05 (×3): qty 1

## 2013-04-05 MED ORDER — INSULIN ASPART 100 UNIT/ML ~~LOC~~ SOLN
0.0000 [IU] | SUBCUTANEOUS | Status: DC
  Administered 2013-04-05 – 2013-04-06 (×2): 2 [IU] via SUBCUTANEOUS
  Administered 2013-04-06: 1 [IU] via SUBCUTANEOUS
  Administered 2013-04-06: 3 [IU] via SUBCUTANEOUS
  Administered 2013-04-06: 1 [IU] via SUBCUTANEOUS
  Administered 2013-04-06: 3 [IU] via SUBCUTANEOUS

## 2013-04-05 MED ORDER — SODIUM CHLORIDE 0.9 % IV SOLN
INTRAVENOUS | Status: DC
  Administered 2013-04-05: 16:00:00 via INTRAVENOUS

## 2013-04-05 MED ORDER — DEXTROSE-NACL 5-0.45 % IV SOLN
INTRAVENOUS | Status: DC
  Administered 2013-04-06: 1000 mL via INTRAVENOUS
  Administered 2013-04-06 – 2013-04-10 (×4): via INTRAVENOUS

## 2013-04-05 MED ORDER — HEPARIN SODIUM (PORCINE) 5000 UNIT/ML IJ SOLN
5000.0000 [IU] | Freq: Three times a day (TID) | INTRAMUSCULAR | Status: DC
  Administered 2013-04-05 – 2013-04-07 (×5): 5000 [IU] via SUBCUTANEOUS
  Filled 2013-04-05 (×8): qty 1

## 2013-04-05 MED ORDER — POLYETHYLENE GLYCOL 3350 17 G PO PACK
17.0000 g | PACK | Freq: Every day | ORAL | Status: DC | PRN
  Filled 2013-04-05: qty 1

## 2013-04-05 MED ORDER — SODIUM CHLORIDE 0.45 % IV SOLN: INTRAVENOUS | Status: DC

## 2013-04-05 MED ORDER — LEVOTHYROXINE SODIUM 50 MCG PO TABS
50.0000 ug | ORAL_TABLET | Freq: Every day | ORAL | Status: DC
  Administered 2013-04-06 – 2013-04-17 (×8): 50 ug via ORAL
  Filled 2013-04-05 (×17): qty 1

## 2013-04-05 MED ORDER — ASPIRIN 81 MG PO CHEW
81.0000 mg | CHEWABLE_TABLET | Freq: Every day | ORAL | Status: DC
  Administered 2013-04-08 – 2013-04-17 (×8): 81 mg via ORAL
  Filled 2013-04-05 (×14): qty 1

## 2013-04-05 NOTE — H&P (Signed)
[facility] History and Physical  Kellie King:[id number] DOB: 1941/06/15 DOA: 04/05/2013  Referring physician: Dr. Manus Gunning PCP: Cala Bradford, MD  Specialists: none  Chief Complaint: acute encephalopathy  HPI: Kellie King is a 72 y.o. female  Resides in a skilled nursing facility with past medical history of dementia, right-sided CVA, 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 She was brought here by EMS, the patient is nonverbal chart called her daughter at [phone] with no response.   In the ED: She was initially tachycardic in the emergency room with stable blood pressure she was given a liter of normal saline and her heart rate decreased, basic metabolic panel was done that showed a sodium of 166 a chloride of 128 and a creatinine of 2.6 (on 04/01/2011 her creatinine was 0.5) lactic acid was done which is 3.0 CBC was done that showed a white count of 19 with a left shift. Urinalysis was done that showed too many to count white blood cells and many bacteria. So we're consulted for further evaluation.  Review of Systems: The patient denies anorexia, fever, weight loss,, vision loss, decreased hearing, hoarseness, chest pain, syncope, dyspnea on exertion, peripheral edema, balance deficits, hemoptysis, abdominal pain, melena, hematochezia, severe indigestion/heartburn, hematuria, incontinence, genital sores, muscle weakness, suspicious skin lesions, transient blindness, difficulty walking, depression, unusual weight change, abnormal bleeding, enlarged lymph nodes, angioedema, and breast masses.    Past Medical History  Diagnosis Date  . CVA (cerebral vascular accident)     right  . Dementia   . Schizo affective schizophrenia   . Hypertension   . Thyroid disease   . Diabetes mellitus   . Anxiety    . Interstitial cystitis     has stimulator in her back  . Allergic rhinitis   . DDD (degenerative disc disease), lumbar   . Hypercholesterolemia   . Frequent PVCs   . Alzheimer disease   . Spinal stenosis     lumbar bulging discs   Past Surgical History  Procedure Laterality Date  . Tubal ligation    . Interstitial cystitis      surgery for this  . Foot surgery      bilateral feet surgery on the bone  . Implantation / placement epidural neurostimulator electrodes     Social History:  reports that she has never smoked. She has never used smokeless tobacco. She reports that she does not drink alcohol or use illicit drugs. Lives at an assisted living facility  Allergies  Allergen Reactions  . Atenolol     Bradycardia   . Penicillins     ?    No family history on file.  Noncontributory family history  Prior to Admission medications   Medication Sig Start Date End Date Taking? Authorizing Provider  amLODipine (NORVASC) 2.5 MG tablet Take 2 tablets (5 mg total) by mouth daily. 03/31/12   Vassie Loll, MD  aspirin 81 MG chewable tablet Chew 81 mg by mouth daily.      Historical Provider, MD  buPROPion (WELLBUTRIN SR) 100 MG 12 hr tablet Take 100 mg by mouth 2 (two) times daily.      Historical Provider, MD  levothyroxine (SYNTHROID, LEVOTHROID) 50 MCG tablet Take 50 mcg by mouth daily.      Historical Provider, MD  mirtazapine (REMERON) 15 MG tablet Take 15 mg by mouth at  bedtime.      Historical Provider, MD  pantoprazole (PROTONIX) 40 MG tablet Take 1 tablet (40 mg total) by mouth daily at 12 noon. 03/31/12 03/31/13  Vassie Loll, MD  risperiDONE (RISPERDAL) 3 MG tablet Take 3 mg by mouth at bedtime.    Historical Provider, MD  venlafaxine (EFFEXOR-XR) 75 MG 24 hr capsule Take 75 mg by mouth 2 (two) times daily.     Historical Provider, MD   Physical Exam: Filed Vitals:   04/05/13 1715  BP: 140/48  Pulse: 84  Temp:   Resp: 16    BP 140/48  Pulse 84  Temp(Src) 98 F  (36.7 C) (Rectal)  Resp 16  SpO2 99%  General Appearance:    in no distress, appears stated age  Head:    Normocephalic, without obvious abnormality, atraumatic           Throat:   Lips, mucosa, and tongue dry   Neck:   Supple, symmetrical, trachea midline, no adenopathy;       thyroid:       Lungs:     Clear to auscultation bilaterally, respirations unlabored     Heart:    Regular rate and rhythm, S1 and S2 normal, no murmur, rub   or gallop  Abdomen:     Soft, non-tender, bowel sounds active all four quadrants,    no masses, no organomegaly        Extremities:   Extremities normal, atraumatic, no cyanosis or edema     Skin:   her skin is cold   Lymph nodes:   Cervical, supraclavicular, and axillary nodes normal  Neurologic:   extraocular movements seems to be intact, she's moving her right side without any difficulties, she has contracture of her left side.       Labs on Admission:  Basic Metabolic Panel:  Recent Labs Lab 04/05/13 1224 04/05/13 1559  NA 163* 166*  K 3.9 3.8  CL 123* 128*  CO2 19  --   GLUCOSE 214* 211*  BUN 127* 129*  CREATININE 2.52* 2.60*  CALCIUM 8.5  --    Liver Function Tests:  Recent Labs Lab 04/05/13 1224  AST 27  ALT 18  ALKPHOS 92  BILITOT 0.7  PROT 6.6  ALBUMIN 3.6   No results found for this basename: LIPASE, AMYLASE,  in the last 168 hours No results found for this basename: AMMONIA,  in the last 168 hours CBC:  Recent Labs Lab 04/05/13 1224 04/05/13 1559  WBC 19.6*  --   NEUTROABS 17.9*  --   HGB 16.4* 16.3*  HCT 50.7* 48.0*  MCV 98.6  --   PLT 149*  --    Cardiac Enzymes:  Recent Labs Lab 04/05/13 1250  TROPONINI <0.30    BNP (last 3 results)  Recent Labs  04/05/13 1250  PROBNP 409.8*   CBG:  Recent Labs Lab 04/05/13 1233  GLUCAP 237*    Radiological Exams on Admission: Ct Head Wo Contrast  04/05/2013   *RADIOLOGY REPORT*  Clinical Data: Altered mental status  CT HEAD WITHOUT CONTRAST   Technique:  Contiguous axial images were obtained from the base of the skull through the vertex without contrast.  Comparison: 05/26/2011  Findings: Examination is significantly limited by patient motion artifact.  The bony calvarium appears within normal limits. Atrophic changes are noted.  There are also changes consistent with prior cerebellar infarcts bilaterally.  These are stable from prior exam.  A lacunar infarct is again noted in  the basal ganglia on the right.  No acute hemorrhage or infarct is seen.  IMPRESSION: Chronic changes without acute abnormality.   Original Report Authenticated By: Alcide Clever, M.D.   Dg Chest Port 1 View  04/05/2013   *RADIOLOGY REPORT*  Clinical Data: Short of breath, hypertension  PORTABLE CHEST - 1 VIEW  Comparison: Prior chest x-ray 03/29/2012  Findings: Cardiac leads overlie the chest.  The cardiac and mediastinal contours remain within normal limits.  There is atherosclerotic calcification in the transverse aorta. The lungs are well-aerated.  No edema, pleural effusion, pneumothorax or focal airspace consolidation.  No acute osseous abnormality. Degenerative changes noted in the bilateral shoulders.  IMPRESSION: No acute cardiopulmonary disease.   Original Report Authenticated By: Malachy Moan, M.D.    EKG: Independently reviewed. Sinus tach left axis, left atrial enlargement and LVH criteria by aVL.  Assessment/Plan  Septic shock/UTI/ Leukocytosis/ High anion gap metabolic acidosis: - Admit her to step down,  She has an elevated lactic acid, with a probable source of infection, she has responded initially to IV fluids in the emergency department with a decrease in her heart rate, continue to give normal saline bolus. Repeat be a basic metabolic panel tonight. We'll place a Foley continue aggressive hydration, her last echo this year showed an ejection fraction of 60%. I agree with continuing IV antibiotics Rocephin.  -We'll monitor her vitals in step  down.  - Place a Foley and continue strict I.'s and O.'s.  - Agree with starting her on Rocephin, will send for urine cultures and blood cultures.  - Her lactic acid is elevated which is probably contributing to her high and gap metabolic acidosis. Anion gap is 19.  Acute kidney injury - Is most likely send her to septic shock, cause prerenal. Will continue IV fluids check a basic metabolic panel in the morning. Place a Foley and monitor Strict I and O's.  Hyponatremia: - Is most likely prerenal, I guess this will correct as we continue to take her volume status. We'll check a basic metabolic panel tonight. After her boluses of normal saline we'll change to D5 half at a rate of 125 and recheck a basic metabolic panel to continue to monitor her sodium. We'll try to avoid a drop.   Dementia: - Continue Seroquel her QTC was within normal limits. We'll have to discuss with family goals of care.   - She is going to need skilled nursing facility on discharge.   Normocytic anemia - Unclear etiology, she'll need colonoscopy as an outpatient.   Hyperglycemia - Is probably reactive secondary to stress. We'll start her on a sliding scale insulin.    Code Status: Full Per previous note  Family Communication: none , try to call to telemetry at [phone] and has been unsuccessful Disposition Plan: inpatient    Time spent: 80 minutes  Marinda Elk [facility] Pager [phone]  If 7PM-7AM, please contact night-coverage [email or web address] Password [facility] 04/05/2013, 5:21 PM

## 2013-04-05 NOTE — ED Notes (Signed)
RN stroke swallow screen not completed, patient with ams and unable to participate in stroke swallow screen

## 2013-04-05 NOTE — ED Notes (Signed)
Patient from home, ems reports from family patient was at her baseline last night which is history of cva with left sided weakness, now patient with rightward gaze and weakness across body, patient with nod head when name called but will not defer from right gaze, patient can hold up right arm with drift, bilateral legs weak and patient not following commands

## 2013-04-05 NOTE — ED Provider Notes (Signed)
CSN: [id number]     Arrival date & time 04/05/13  1155 History   First MD Initiated Contact with Patient 04/05/13 1206     Chief Complaint  Patient presents with  . Altered Mental Status  . Cerebrovascular Accident   (Consider location/radiation/quality/duration/timing/severity/associated sxs/prior Treatment) HPI  Kellie King is a 72 y.o. female oral brought in by EMS from home complaining of altered mental status. Patient has history of right-sided CVA with chronic left-sided contracture, Alzheimer's dementia and schizophrenia. As per EMS patient has not been at her baseline for the last 24 hours, no other history provided, mental status at baseline is unknown. Patient is nonverbal but responds to her name. Level V caveat secondary to dementia and altered mental status  Past Medical History  Diagnosis Date  . CVA (cerebral vascular accident)     right  . Dementia   . Schizo affective schizophrenia   . Hypertension   . Thyroid disease   . Diabetes mellitus   . Anxiety   . Interstitial cystitis     has stimulator in her back  . Allergic rhinitis   . DDD (degenerative disc disease), lumbar   . Hypercholesterolemia   . Frequent PVCs   . Alzheimer disease   . Spinal stenosis     lumbar bulging discs   Past Surgical History  Procedure Laterality Date  . Tubal ligation    . Interstitial cystitis      surgery for this  . Foot surgery      bilateral feet surgery on the bone  . Implantation / placement epidural neurostimulator electrodes     No family history on file. History  Substance Use Topics  . Smoking status: Never Smoker   . Smokeless tobacco: Never Used  . Alcohol Use: No   OB History   Grav Para Term Preterm Abortions TAB SAB Ect Mult Living                 Review of Systems  Unable to perform ROS: Dementia    Allergies  Atenolol and Penicillins  Home Medications   Current Outpatient Rx  Name  Route  Sig  Dispense  Refill  . amLODipine (NORVASC)  2.5 MG tablet   Oral   Take 2 tablets (5 mg total) by mouth daily.   30 tablet   1   . aspirin 81 MG chewable tablet   Oral   Chew 81 mg by mouth daily.           Marland Kitchen buPROPion (WELLBUTRIN SR) 100 MG 12 hr tablet   Oral   Take 100 mg by mouth 2 (two) times daily.           Marland Kitchen levothyroxine (SYNTHROID, LEVOTHROID) 50 MCG tablet   Oral   Take 50 mcg by mouth daily.           . mirtazapine (REMERON) 15 MG tablet   Oral   Take 15 mg by mouth at bedtime.           Marland Kitchen EXPIRED: pantoprazole (PROTONIX) 40 MG tablet   Oral   Take 1 tablet (40 mg total) by mouth daily at 12 noon.   30 tablet   1   . risperiDONE (RISPERDAL) 3 MG tablet   Oral   Take 3 mg by mouth at bedtime.         Marland Kitchen venlafaxine (EFFEXOR-XR) 75 MG 24 hr capsule   Oral   Take 75 mg by mouth 2 (two)  times daily.           BP 119/96  Pulse 109  Resp 18  SpO2 100% Physical Exam  Nursing note and vitals reviewed. Constitutional: No distress.  Cachectic, disheveled, poor hygiene  HENT:  Head: Normocephalic.  Dry MM  Eyes: Conjunctivae are normal. Pupils are equal, round, and reactive to light.  Neck: Normal range of motion.  Cardiovascular: Normal rate, regular rhythm and intact distal pulses.   Pulmonary/Chest: Effort normal and breath sounds normal. No stridor. No respiratory distress. She has no wheezes. She has no rales. She exhibits no tenderness.  Abdominal: Soft. Bowel sounds are normal. She exhibits no distension and no mass. There is no tenderness. There is no rebound and no guarding.  Musculoskeletal: Normal range of motion.  Contracted on the left upper extremity  Neurological:  Patient opens eyes spontaneously, can not, responds to her name, intermittently follows commands,  Sided upper extremity contracture  Psychiatric: She has a normal mood and affect.    ED Course  Procedures (including critical care time) Labs Review Labs Reviewed  URINALYSIS, ROUTINE W REFLEX MICROSCOPIC -  Abnormal; Notable for the following:    Color, Urine AMBER (*)    APPearance TURBID (*)    Hgb urine dipstick LARGE (*)    Bilirubin Urine MODERATE (*)    Ketones, ur 15 (*)    Protein, ur 30 (*)    Leukocytes, UA LARGE (*)    All other components within normal limits  CBC WITH DIFFERENTIAL - Abnormal; Notable for the following:    WBC 19.6 (*)    RBC 5.14 (*)    Hemoglobin 16.4 (*)    HCT 50.7 (*)    Platelets 149 (*)    Neutrophils Relative % 92 (*)    Neutro Abs 17.9 (*)    Lymphocytes Relative 6 (*)    Monocytes Relative 2 (*)    All other components within normal limits  GLUCOSE, CAPILLARY - Abnormal; Notable for the following:    Glucose-Capillary 237 (*)    All other components within normal limits  URINE MICROSCOPIC-ADD ON - Abnormal; Notable for the following:    Squamous Epithelial / LPF MANY (*)    Bacteria, UA MANY (*)    All other components within normal limits  POCT I-STAT, CHEM 8 - Abnormal; Notable for the following:    Sodium 166 (*)    Chloride 128 (*)    BUN 129 (*)    Creatinine, Ser 2.60 (*)    Glucose, Bld 211 (*)    Calcium, Ion 1.04 (*)    Hemoglobin 16.3 (*)    HCT 48.0 (*)    All other components within normal limits  CG4 I-STAT (LACTIC ACID) - Abnormal; Notable for the following:    Lactic Acid, Venous 3.02 (*)    All other components within normal limits  CULTURE, BLOOD (ROUTINE X 2)  CULTURE, BLOOD (ROUTINE X 2)  URINE CULTURE  BASIC METABOLIC PANEL  HEPATIC FUNCTION PANEL  AMMONIA  PROTIME-INR  TROPONIN I  PRO B NATRIURETIC PEPTIDE   Imaging Review Ct Head Wo Contrast  04/05/2013   *RADIOLOGY REPORT*  Clinical Data: Altered mental status  CT HEAD WITHOUT CONTRAST  Technique:  Contiguous axial images were obtained from the base of the skull through the vertex without contrast.  Comparison: 05/26/2011  Findings: Examination is significantly limited by patient motion artifact.  The bony calvarium appears within normal limits. Atrophic  changes are noted.  There are  also changes consistent with prior cerebellar infarcts bilaterally.  These are stable from prior exam.  A lacunar infarct is again noted in the basal ganglia on the right.  No acute hemorrhage or infarct is seen.  IMPRESSION: Chronic changes without acute abnormality.   Original Report Authenticated By: Alcide Clever, M.D.   Dg Chest Port 1 View  04/05/2013   *RADIOLOGY REPORT*  Clinical Data: Short of breath, hypertension  PORTABLE CHEST - 1 VIEW  Comparison: Prior chest x-ray 03/29/2012  Findings: Cardiac leads overlie the chest.  The cardiac and mediastinal contours remain within normal limits.  There is atherosclerotic calcification in the transverse aorta. The lungs are well-aerated.  No edema, pleural effusion, pneumothorax or focal airspace consolidation.  No acute osseous abnormality. Degenerative changes noted in the bilateral shoulders.  IMPRESSION: No acute cardiopulmonary disease.   Original Report Authenticated By: Malachy Moan, M.D.     Date: 04/05/2013  Rate: 106  Rhythm: sinus tachycardia  QRS Axis: left  Intervals: normal  ST/T Wave abnormalities: nonspecific ST/T changes  Conduction Disutrbances:none  Narrative Interpretation:   Old EKG Reviewed: unchanged   MDM   1. Urosepsis   2. Dementia   3. Elevated lactic acid level   4. Hypernatremia   5. AKI (acute kidney injury)     Filed Vitals:   04/05/13 1347 04/05/13 1415 04/05/13 1500 04/05/13 1515  BP:  116/77 135/86 105/59  Pulse:  104    Temp: 98 F (36.7 C)     TempSrc: Rectal     Resp:      SpO2:  98%      Kellie King is a 72 y.o. female with past medical history significant for schizophrenia and Alzheimer's dementia presenting with altered mental status over the course of the last day. Patient's family is not present to clarify the nature of her decline in her baseline status. Patient is tachycardic and has a soft blood pressure with systolic in the mid 80s. Afebrile  rectally. CT head is negative and portable chest x-ray also shows no abnormalities. There is a delay in getting her blood work because of difficulty establishing IV access. Urinalysis appears to be infected with large leukocytes and white blood cells. Chart review shows that prior urine cultures are susceptible to Rocephin, 1 g given.   This is a shared visit with the attending physician who personally evaluated the patient and agrees with the care plan. We have discussed the sodium level, believe that this likely may be inaccurate based on the nature of the i-STAT test. Patient will be started on half-normal saline at 75 cc an hour.  Sodium level is confirmed on the be met at 164 by a verbal report from the lab tech. Total water body deficits translated by Dr. Manus Gunning to be the 6 L, patient will be repleted over the first 24 hours with D5 half normal saline at 100 cc per hour.   Patient will be admitted to a step down bed under the care of Dr. Robb Matar, he recommends hold he D5 water now and bolusing 1 more liter of normal saline.   Medications  0.9 %  sodium chloride infusion ( Intravenous New Bag/Given 04/05/13 1556)  cefTRIAXone (ROCEPHIN) 1 g in dextrose 5 % 50 mL IVPB (1 g Intravenous New Bag/Given 04/05/13 1556)  sodium chloride 0.9 % bolus 1,000 mL (0 mLs Intravenous Stopped 04/05/13 1556)   Note: Portions of this report may have been transcribed using voice recognition software. Every effort was  made to ensure accuracy; however, inadvertent computerized transcription errors may be present      Wynetta Emery, PA-C 04/09/13 1838

## 2013-04-05 NOTE — ED Notes (Signed)
Checked patient blood sugar it was 237 notified RN Scott of blood sugar

## 2013-04-06 DIAGNOSIS — F0390 Unspecified dementia without behavioral disturbance: Secondary | ICD-10-CM

## 2013-04-06 DIAGNOSIS — N390 Urinary tract infection, site not specified: Secondary | ICD-10-CM

## 2013-04-06 LAB — GLUCOSE, CAPILLARY
Glucose-Capillary: 139 mg/dL — ABNORMAL HIGH (ref 70–99)
Glucose-Capillary: 191 mg/dL — ABNORMAL HIGH (ref 70–99)
Glucose-Capillary: 192 mg/dL — ABNORMAL HIGH (ref 70–99)
Glucose-Capillary: 213 mg/dL — ABNORMAL HIGH (ref 70–99)

## 2013-04-06 LAB — BASIC METABOLIC PANEL
BUN: 84 mg/dL — ABNORMAL HIGH (ref 6–23)
BUN: 56 mg/dL — ABNORMAL HIGH (ref 6–23)
CO2: 23 mEq/L (ref 19–32)
CO2: 22 mEq/L (ref 19–32)
Calcium: 8.1 mg/dL — ABNORMAL LOW (ref 8.4–10.5)
Calcium: 8.1 mg/dL — ABNORMAL LOW (ref 8.4–10.5)
Chloride: 127 mEq/L — ABNORMAL HIGH (ref 96–112)
Chloride: 124 mEq/L — ABNORMAL HIGH (ref 96–112)
Creatinine, Ser: 1.4 mg/dL — ABNORMAL HIGH (ref 0.50–1.10)
Creatinine, Ser: 1.12 mg/dL — ABNORMAL HIGH (ref 0.50–1.10)
GFR calc Af Amer: 42 mL/min — ABNORMAL LOW (ref >90)
GFR calc Af Amer: 55 mL/min — ABNORMAL LOW (ref >90)
GFR calc non Af Amer: 36 mL/min — ABNORMAL LOW (ref >90)
GFR calc non Af Amer: 47 mL/min — ABNORMAL LOW (ref >90)
Glucose, Bld: 236 mg/dL — ABNORMAL HIGH (ref 70–99)
Glucose, Bld: 216 mg/dL — ABNORMAL HIGH (ref 70–99)
Potassium: 3.7 mEq/L (ref 3.5–5.1)
Potassium: 3.7 mEq/L (ref 3.5–5.1)
Sodium: 161 mEq/L (ref 135–145)
Sodium: 157 mEq/L — ABNORMAL HIGH (ref 135–145)

## 2013-04-06 LAB — CBC
HCT: 44.3 % (ref 36.0–46.0)
Platelets: 104 10*3/uL — ABNORMAL LOW (ref 150–400)
RBC: 4.46 MIL/uL (ref 3.87–5.11)
RDW: 14.2 % (ref 11.5–15.5)
WBC: 16.5 10*3/uL — ABNORMAL HIGH (ref 4.0–10.5)

## 2013-04-06 MED ORDER — HYDRALAZINE HCL 20 MG/ML IJ SOLN: 10.0000 mg | Freq: Four times a day (QID) | INTRAMUSCULAR | Status: DC | PRN

## 2013-04-06 MED ORDER — ENSURE COMPLETE PO LIQD
237.0000 mL | Freq: Two times a day (BID) | ORAL | Status: DC
  Administered 2013-04-06 – 2013-04-17 (×12): 237 mL via ORAL

## 2013-04-06 MED ORDER — BIOTENE DRY MOUTH MT LIQD
15.0000 mL | Freq: Two times a day (BID) | OROMUCOSAL | Status: DC
  Administered 2013-04-06 – 2013-04-09 (×8): 15 mL via OROMUCOSAL

## 2013-04-06 MED ORDER — AMLODIPINE BESYLATE 5 MG PO TABS
5.0000 mg | ORAL_TABLET | Freq: Every day | ORAL | Status: DC
  Administered 2013-04-06 – 2013-04-17 (×9): 5 mg via ORAL
  Filled 2013-04-06 (×14): qty 1

## 2013-04-06 MED ORDER — CHLORHEXIDINE GLUCONATE 0.12 % MT SOLN
15.0000 mL | Freq: Two times a day (BID) | OROMUCOSAL | Status: DC
  Administered 2013-04-06 – 2013-04-09 (×6): 15 mL via OROMUCOSAL
  Filled 2013-04-06 (×9): qty 15

## 2013-04-06 MED ORDER — INSULIN ASPART 100 UNIT/ML ~~LOC~~ SOLN
0.0000 [IU] | Freq: Three times a day (TID) | SUBCUTANEOUS | Status: DC
  Administered 2013-04-08: 2 [IU] via SUBCUTANEOUS
  Administered 2013-04-08: 1 [IU] via SUBCUTANEOUS
  Administered 2013-04-10 – 2013-04-11 (×2): 2 [IU] via SUBCUTANEOUS
  Administered 2013-04-11: 3 [IU] via SUBCUTANEOUS
  Administered 2013-04-11 – 2013-04-13 (×3): 2 [IU] via SUBCUTANEOUS
  Administered 2013-04-14 (×3): 1 [IU] via SUBCUTANEOUS
  Administered 2013-04-15 (×2): 2 [IU] via SUBCUTANEOUS
  Administered 2013-04-16: 1 [IU] via SUBCUTANEOUS
  Administered 2013-04-16: 2 [IU] via SUBCUTANEOUS
  Administered 2013-04-16: 1 [IU] via SUBCUTANEOUS
  Administered 2013-04-17: 2 [IU] via SUBCUTANEOUS

## 2013-04-06 MED ORDER — VENLAFAXINE HCL ER 75 MG PO CP24
75.0000 mg | ORAL_CAPSULE | Freq: Two times a day (BID) | ORAL | Status: DC
  Administered 2013-04-06 (×2): 75 mg via ORAL
  Filled 2013-04-06 (×4): qty 1

## 2013-04-06 MED ORDER — ASPIRIN 81 MG PO CHEW
CHEWABLE_TABLET | ORAL | Status: AC
  Administered 2013-04-06: 81 mg
  Filled 2013-04-06: qty 1

## 2013-04-06 MED ORDER — MIRTAZAPINE 15 MG PO TABS
15.0000 mg | ORAL_TABLET | Freq: Every day | ORAL | Status: DC
  Administered 2013-04-06: 15 mg via ORAL
  Filled 2013-04-06 (×2): qty 1

## 2013-04-06 MED ORDER — INSULIN ASPART 100 UNIT/ML ~~LOC~~ SOLN: 0.0000 [IU] | Freq: Every day | SUBCUTANEOUS | Status: DC

## 2013-04-06 NOTE — Progress Notes (Addendum)
[facility] PROGRESS NOTE  JACKLYN BRANAN XBJ:[id number] DOB: [birth year] DOA: 04/05/2013 PCP: Cala Bradford, MD  Assessment/Plan: Septic shock/UTI/ Leukocytosis  - urine culture -rocephin -BP improved- will allow to be about 140s -urine very strong smelling  Acute kidney injury  - foley -IVF -I/O  Hypernatremia:  - D5 half at a rate of 125  -BMP q 12 At least 6 liters behind- will decrease slowly  Dementia:  - Continue Seroquel her QTC was within normal limits.  -need to discuss with family goals of care (no family at bedside) - She is going to need skilled nursing facility on discharge.   Normocytic anemia  - Unclear etiology, she'll need colonoscopy as an outpatient  Hyperglycemia  -  sliding scale insulin.   ?dementia -?baseline -get swallow eval   Code Status: full Family Communication: no family at bedside Disposition Plan: SDU until at least tomm   Consultants:  SLP  Procedures:    Antibiotics:    HPI/Subjective: Sleeping- opens eyes to command  Objective: Filed Vitals:   04/06/13 0751  BP: 144/61  Pulse: 76  Temp: 98.1 F (36.7 C)  Resp: 22    Intake/Output Summary (Last 24 hours) at 04/06/13 0855 Last data filed at 04/06/13 0844  Gross per 24 hour  Intake   1375 ml  Output    400 ml  Net    975 ml   Filed Weights   04/05/13 1826 04/06/13 0435  Weight: 48.8 kg (107 lb 9.4 oz) 49.1 kg (108 lb 3.9 oz)    Exam:   General:  Opens eyes to voice, dry mucous membranes  Cardiovascular: rrr  Respiratory: clear anterior, no wheezing  Abdomen: +BS, soft  Musculoskeletal: no edema   Data Reviewed: Basic Metabolic Panel:  Recent Labs Lab 04/05/13 1224 04/05/13 1559 04/05/13 1900  NA 163* 166* 164*  K 3.9 3.8 4.5  CL 123* 128* 128*  CO2 19  --  22  GLUCOSE 214* 211* 190*  BUN 127* 129* 120*  CREATININE 2.52* 2.60* 2.05*  CALCIUM 8.5  --  8.0*  MG  --   --  3.1*   Liver Function Tests:  Recent Labs Lab  04/05/13 1224  AST 27  ALT 18  ALKPHOS 92  BILITOT 0.7  PROT 6.6  ALBUMIN 3.6   No results found for this basename: LIPASE, AMYLASE,  in the last 168 hours No results found for this basename: AMMONIA,  in the last 168 hours CBC:  Recent Labs Lab 04/05/13 1224 04/05/13 1559 04/05/13 1900  WBC 19.6*  --  19.6*  NEUTROABS 17.9*  --   --   HGB 16.4* 16.3* 14.9  HCT 50.7* 48.0* 46.6*  MCV 98.6  --  99.4  PLT 149*  --  138*   Cardiac Enzymes:  Recent Labs Lab 04/05/13 1250  TROPONINI <0.30   BNP (last 3 results)  Recent Labs  04/05/13 1250  PROBNP 409.8*   CBG:  Recent Labs Lab 04/05/13 1233 04/05/13 2106 04/06/13 0012 04/06/13 0434 04/06/13 0755  GLUCAP 237* 174* 191* 139* 192*    Recent Results (from the past 240 hour(s))  CULTURE, BLOOD (ROUTINE X 2)     Status: None   Collection Time    04/05/13  3:23 PM      Result Value Range Status   Specimen Description BLOOD HAND RIGHT   Final   Special Requests BOTTLES DRAWN AEROBIC ONLY .5CC   Final   Culture  Setup Time  Final   Value: 04/05/2013 22:09     Performed at [facility]   Culture     Final   Value:        BLOOD CULTURE RECEIVED NO GROWTH TO DATE CULTURE WILL BE HELD FOR 5 DAYS BEFORE ISSUING A FINAL NEGATIVE REPORT     Performed at [facility]   Report Status PENDING   Incomplete  CULTURE, BLOOD (ROUTINE X 2)     Status: None   Collection Time    04/05/13  3:45 PM      Result Value Range Status   Specimen Description BLOOD RIGHT HAND   Final   Special Requests BOTTLES DRAWN AEROBIC ONLY 1CC   Final   Culture  Setup Time     Final   Value: 04/05/2013 22:10     Performed at [facility]   Culture     Final   Value:        BLOOD CULTURE RECEIVED NO GROWTH TO DATE CULTURE WILL BE HELD FOR 5 DAYS BEFORE ISSUING A FINAL NEGATIVE REPORT     Performed at [facility]   Report Status PENDING   Incomplete  MRSA PCR SCREENING     Status: None   Collection Time     04/05/13  6:53 PM      Result Value Range Status   MRSA by PCR NEGATIVE  NEGATIVE Final   Comment:            The GeneXpert MRSA Assay (FDA     approved for NASAL specimens     only), is one component of a     comprehensive MRSA colonization     surveillance program. It is not     intended to diagnose MRSA     infection nor to guide or     monitor treatment for     MRSA infections.     Studies: Ct Head Wo Contrast  04/05/2013   *RADIOLOGY REPORT*  Clinical Data: Altered mental status  CT HEAD WITHOUT CONTRAST  Technique:  Contiguous axial images were obtained from the base of the skull through the vertex without contrast.  Comparison: 05/26/2011  Findings: Examination is significantly limited by patient motion artifact.  The bony calvarium appears within normal limits. Atrophic changes are noted.  There are also changes consistent with prior cerebellar infarcts bilaterally.  These are stable from prior exam.  A lacunar infarct is again noted in the basal ganglia on the right.  No acute hemorrhage or infarct is seen.  IMPRESSION: Chronic changes without acute abnormality.   Original Report Authenticated By: Alcide Clever, M.D.   Dg Chest Port 1 View  04/05/2013   *RADIOLOGY REPORT*  Clinical Data: Short of breath, hypertension  PORTABLE CHEST - 1 VIEW  Comparison: Prior chest x-ray 03/29/2012  Findings: Cardiac leads overlie the chest.  The cardiac and mediastinal contours remain within normal limits.  There is atherosclerotic calcification in the transverse aorta. The lungs are well-aerated.  No edema, pleural effusion, pneumothorax or focal airspace consolidation.  No acute osseous abnormality. Degenerative changes noted in the bilateral shoulders.  IMPRESSION: No acute cardiopulmonary disease.   Original Report Authenticated By: Malachy Moan, M.D.    Scheduled Meds: . aspirin  81 mg Oral Daily  . cefTRIAXone (ROCEPHIN)  IV  1 g Intravenous Q24H  . heparin  5,000 Units Subcutaneous Q8H  .  insulin aspart  0-9 Units Subcutaneous Q4H  . levothyroxine  50 mcg Oral  QAC breakfast  . risperiDONE  3 mg Oral QHS   Continuous Infusions: . dextrose 5 % and 0.45% NaCl 125 mL/hr at 04/06/13 0400    Principal Problem:   Septic shock Active Problems:   Dementia   UTI (lower urinary tract infection)   Leukocytosis   High anion gap metabolic acidosis   Acute kidney injury   Normocytic anemia   Hyperglycemia   Hypernatremia    Time spent: 35    [facility], Javoni Lucken  [facility] Pager [phone]. If 7PM-7AM, please contact night-coverage at [email or web address], password [facility] 04/06/2013, 8:55 AM  LOS: 1 day    Spoke with daughter: patient is DNR, would like palliative care consult for goals of care

## 2013-04-06 NOTE — Progress Notes (Addendum)
INITIAL NUTRITION ASSESSMENT  DOCUMENTATION CODES Per approved criteria  -Not Applicable   INTERVENTION:  Ensure Complete twice daily (350 kcals, 13 gm protein per 8 fl oz bottle) RD to follow for nutrition care plan  NUTRITION DIAGNOSIS: Predicted suboptimal intake related to dementia, dysphagia as evidenced by clinical/bedside swallow evaluation  Goal: Pt to meet >/= 90% of their estimated nutrition needs   Monitor:  PO & supplemental intake, weight, labs, I/O's  Reason for Assessment: Malnutrition Screening Tool Report  72 y.o. female  Admitting Dx: Septic shock  ASSESSMENT: Patient with PMH of dementia, right-sided CVA, chronic contractures on the left side, schizoaffective disorder and HTN; admitted with increased confusion from SNF; recently discharged from the hospital for encephalopathy secondary to UTI; patient is nonverbal.  RD unable to obtain nutrition hx from patient; no family at bedside; s/p bedside swallow evaluation this AM ---> SLP recommending Dys 1--thin liquid diet; per weight readings, patient has had a 15% weight loss since November 2013 (significant for time frame); would benefit from nutrition supplements ---> RD to order.  RD suspects some level of malnutrition, however, unable to identify at this time.  Height: Ht Readings from Last 1 Encounters:  04/05/13 5\' 4"  (1.626 m)    Weight: Wt Readings from Last 1 Encounters:  04/06/13 108 lb 3.9 oz (49.1 kg)    Ideal Body Weight: 120 lb  % Ideal Body Weight: 90%  Wt Readings from Last 10 Encounters:  04/06/13 108 lb 3.9 oz (49.1 kg)  06/13/12 128 lb (58.06 kg)  03/28/12 119 lb 4.3 oz (54.1 kg)  08/09/11 107 lb (48.535 kg)    Usual Body Weight: 128 lb  % Usual Body Weight: 84%  BMI:  Body mass index is 18.57 kg/(m^2).  Estimated Nutritional Needs: Kcal: 1200-1400 Protein: 50-60 gm Fluid: >/= 1.5 L  Skin: Intact  Diet Order: Dysphagia 1, thin liquids  EDUCATION NEEDS: -No  education needs identified at this time   Intake/Output Summary (Last 24 hours) at 04/06/13 1059 Last data filed at 04/06/13 0844  Gross per 24 hour  Intake   1375 ml  Output    400 ml  Net    975 ml    Labs:   Recent Labs Lab 04/05/13 1224 04/05/13 1559 04/05/13 1900 04/06/13 0840  NA 163* 166* 164* 161*  K 3.9 3.8 4.5 3.7  CL 123* 128* 128* 127*  CO2 19  --  22 23  BUN 127* 129* 120* 84*  CREATININE 2.52* 2.60* 2.05* 1.40*  CALCIUM 8.5  --  8.0* 8.1*  MG  --   --  3.1*  --   GLUCOSE 214* 211* 190* 236*    CBG (last 3)   Recent Labs  04/06/13 0012 04/06/13 0434 04/06/13 0755  GLUCAP 191* 139* 192*    Scheduled Meds: . antiseptic oral rinse  15 mL Mouth Rinse q12n4p  . aspirin  81 mg Oral Daily  . cefTRIAXone (ROCEPHIN)  IV  1 g Intravenous Q24H  . chlorhexidine  15 mL Mouth Rinse BID  . heparin  5,000 Units Subcutaneous Q8H  . insulin aspart  0-9 Units Subcutaneous Q4H  . levothyroxine  50 mcg Oral QAC breakfast  . risperiDONE  3 mg Oral QHS    Continuous Infusions: . dextrose 5 % and 0.45% NaCl 125 mL/hr at 04/06/13 0400    Past Medical History  Diagnosis Date  . CVA (cerebral vascular accident)     right  . Dementia   . Schizo  affective schizophrenia   . Hypertension   . Thyroid disease   . Diabetes mellitus   . Anxiety   . Interstitial cystitis     has stimulator in her back  . Allergic rhinitis   . DDD (degenerative disc disease), lumbar   . Hypercholesterolemia   . Frequent PVCs   . Alzheimer disease   . Spinal stenosis     lumbar bulging discs    Past Surgical History  Procedure Laterality Date  . Tubal ligation    . Interstitial cystitis      surgery for this  . Foot surgery      bilateral feet surgery on the bone  . Implantation / placement epidural neurostimulator electrodes      Maureen Chatters, RD, LDN Pager #: [phone] After-Hours Pager #: [phone]

## 2013-04-06 NOTE — Progress Notes (Signed)
Thank you for consulting the Palliative Medicine Team at [facility] to meet your patient's and family's needs.   The reason that you asked [place] to see your patient is for goals of care discussion  We have scheduled your patient for a meeting: await call back to team phone to schedule meeting time  The Surrogate decision maker is: daughter [name] [phone]  Other family members that need to be present:   Your patient is unable to participate  Additional Narrative: patient seen asleep, did not respond to quiet voice, no family at bedside attempted to contact daughter [name] [phone] left mssage await c/b to schedule   [name], RN 04/06/2013, 4:27 PM Palliative Medicine Team RN Liaison [phone]

## 2013-04-06 NOTE — Evaluation (Signed)
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  Pt now with septic shock due to UTI.  Pt from home with dtr and dtr possible considering palliative care per chart.    Clinical Impression   Pt admitted with above. Unable to obtain PLOF info due to pt is unreliable historian and no family available (unable to reach daughter by phone as well). Per chart family requesting to meet with Palliative Care.  Depending on goals of palliative care meeting, will determine need for continued therapy.  If pt to return home with family, will focus on family education to decreased burden of care. Will continue to follow acutely in order to address below problem list.  Recommending SNF if pt does not have necessary 24/7 assist at home.    OT Assessment  Patient needs continued OT Services    Follow Up Recommendations  SNF;Supervision/Assistance - 24 hour    Barriers to Discharge      Equipment Recommendations   (TBD)    Recommendations for Other Services    Frequency  Min 2X/week    Precautions / Restrictions Precautions Precautions: Fall   Pertinent Vitals/Pain See vitals    ADL  Grooming: Performed;Wash/dry hands;Wash/dry face;+1 Total assistance Where Assessed - Grooming: Supine, head of bed up Upper Body Bathing: Simulated;+1 Total assistance Where Assessed - Upper Body Bathing: Supine, head of bed up Lower Body Bathing: Simulated;+2 Total assistance Lower Body Bathing: Patient Percentage: 0% Where Assessed - Lower  Body Bathing: Supported sitting Upper Body Dressing: Simulated;+1 Total assistance Where Assessed - Upper Body Dressing: Supine, head of bed up Lower Body Dressing: Simulated;+2 Total assistance Lower Body Dressing: Patient Percentage: 0% Where Assessed - Lower Body Dressing: Supported sitting ADL Comments: Applied palm protector to left hand to assist with hygiene and prevent skin breakdown due to pt's flexion contracture and long jagged nails.  Notified RN of palm protector.    OT Diagnosis: Generalized weakness;Cognitive deficits  OT Problem List: Decreased strength;Decreased activity tolerance;Decreased cognition;Impaired UE functional use OT Treatment Interventions: Self-care/ADL training;DME and/or AE instruction;Therapeutic activities;Patient/family education;Cognitive remediation/compensation   OT Goals(Current goals can be found in the care plan section) Acute Rehab OT Goals Patient Stated Goal: Pt unable to set OT Goal Formulation: Patient unable to participate in goal setting Time For Goal Achievement: 04/20/13 Potential to Achieve Goals: Good  Visit Information  Last OT Received On: 04/06/13 Assistance Needed: +2 PT/OT Co-Evaluation/Treatment: Yes History of Present Illness: Pt is 72 y/o female with past medical history of dementia, right-sided CVA, 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  Pt now with septic shock due to UTI.  Pt from home with dtr and dtr possible considering palliative care per chart.        Prior Functioning     Home Living Family/patient expects to be discharged to:: Skilled nursing facility Living Arrangements: Children (dtr per chart & CM) Additional Comments: No family at bedside and pt unable to give home environment or  history Prior Function Comments: Family not at bedside and  pt unable provide information Communication Communication: Expressive difficulties;Receptive difficulties         Vision/Perception     Cognition  Cognition Arousal/Alertness: Awake/alert Behavior During Therapy: Flat affect Overall Cognitive Status: No family/caregiver present to determine baseline cognitive functioning Area of Impairment: Following commands;Awareness;Problem solving Memory:  (hx of dementia) Following Commands:  (Unable to follow commands pt will say "OK" & not perform tas) Awareness: Intellectual Problem Solving: Decreased initiation General Comments: Pt with very little verbalization and unable to follow commands at this time.    Extremity/Trunk Assessment Upper Extremity Assessment Upper Extremity Assessment: Difficult to assess due to impaired cognition;LUE deficits/detail LUE Deficits / Details: flexion contractures due to past CVA Lower Extremity Assessment Lower Extremity Assessment: Difficult to assess due to impaired cognition     Mobility Bed Mobility Bed Mobility: Supine to Sit;Sitting - Scoot to Edge of Bed;Sit to Supine Supine to Sit: 1: +2 Total assist Supine to Sit: Patient Percentage: 0% Sitting - Scoot to Edge of Bed: 1: +1 Total assist Sit to Supine: 1: +2 Total assist;HOB flat Sit to Supine: Patient Percentage: 0% Details for Bed Mobility Assistance: +2 (A) with all mobility and unable to follow commands      Exercise     Balance Balance Balance Assessed: Yes Static Sitting Balance Static Sitting - Balance Support: Feet supported Static Sitting - Level of Assistance: 2: Max assist Static Sitting - Comment/# of Minutes: ~ 5 minutes; pt needed (A) to maintain balance due to right sided lean; pt unable to maintain midline   End of Session OT - End of Session Activity Tolerance: Patient limited by lethargy Patient left: in bed;with call bell/phone within reach;with nursing/sitter in room Nurse Communication: Mobility status  GO     04/06/2013 Cipriano Mile OTR/L Pager [phone] Office [phone]  Cipriano Mile 04/06/2013, 5:53 PM

## 2013-04-06 NOTE — Evaluation (Signed)
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  Pt now with septic shock due to UTI.  Pt from home with dtr and dtr possible considering palliative care per chart.   Clinical Impression  Pt admitted with the above. Pt currently with functional limitations due to the deficits listed below (see PT Problem List). Very limited evaluation due to poor historian due to cognition impairments and hx of dementia.  No family at bedside and unable to reach dtr on phone.  Per chart family request to meet with Palliative Care.  Depending on goals of care will determine the need for continued therapy.  If pt to d/c home will need further equipment and possible benefit from Hospice Care depending on prognosis and MDs recommendation.  Pt will benefit from skilled PT to increase their independence and safety with mobility to allow discharge to the venue listed below.     PT Assessment  Patient needs continued PT services    Follow Up Recommendations  Supervision/Assistance - 24 hour (depending on palliative care meeting) SNF If home may benefit from Hospice Care   Barriers to Discharge Other (comment) (need to further assess caregiver support at home)      Equipment Recommendations  Other (comment) (TBD depending on d/c plans) If home will need hospital bed   Frequency Min 2X/week (trial)    Precautions / Restrictions Precautions Precautions: Fall   Pertinent Vitals/Pain Unable to rate but does not seem to be in pain       Mobility  Bed Mobility Bed Mobility: Supine to Sit;Sitting - Scoot to Edge of Bed;Sit to Supine Supine to Sit: 1: +2 Total assist Supine to Sit: Patient Percentage: 0% Sitting - Scoot to Edge of Bed: 1: +1 Total assist Sit to Supine: 1: +2 Total assist;HOB flat Sit to Supine: Patient Percentage: 0% Details for Bed Mobility Assistance: +2 (A) with all mobility and unable to follow commands  Transfers Transfers: Not assessed Ambulation/Gait Ambulation/Gait Assistance: Not tested (comment)    Exercises     PT Diagnosis: Generalized weakness  PT Problem List: Decreased strength;Decreased range of motion;Decreased activity tolerance;Decreased balance;Decreased mobility;Decreased cognition;Decreased knowledge of use of DME PT Treatment Interventions: DME instruction;Functional mobility training;Therapeutic activities;Therapeutic exercise;Balance training;Neuromuscular re-education;Cognitive remediation;Patient/family education     PT Goals(Current goals can be found in the care plan section) Acute Rehab PT Goals Patient Stated Goal: Pt unable to set PT Goal Formulation: Patient unable to participate in goal setting Time For Goal Achievement: 04/13/13 Potential to Achieve Goals: Poor  Visit Information  History of Present Illness: Pt is 72 y/o female with past medical history of dementia, right-sided CVA, 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  Pt now with septic shock due to UTI.  Pt from home with dtr and dtr possible considering palliative care per chart.        Prior Functioning  Home Living Family/patient expects to be discharged to:: Skilled nursing facility Living Arrangements: Children (dtr  per chart & CM) Additional Comments: No family at bedside and pt unable to give home environment or history Prior  Function Comments: Family not at bedside and pt unable provide information Communication Communication: Expressive difficulties;Receptive difficulties    Cognition  Cognition Arousal/Alertness: Awake/alert Behavior During Therapy: Flat affect Overall Cognitive Status: No family/caregiver present to determine baseline cognitive functioning Area of Impairment: Following commands;Awareness;Problem solving Memory:  (hx of dementia) Following Commands:  (Unable to follow commands pt will say "OK" & not perform tas) Awareness: Intellectual Problem Solving: Decreased initiation General Comments: Pt with very little verbalization and unable to follow commands at this time.    Extremity/Trunk Assessment Upper Extremity Assessment Upper Extremity Assessment: LUE deficits/detail LUE Deficits / Details: flexion contractures due to past CVA Lower Extremity Assessment Lower Extremity Assessment: Difficult to assess due to impaired cognition   Balance Balance Balance Assessed: Yes Static Sitting Balance Static Sitting - Balance Support: Feet supported Static Sitting - Level of Assistance: 2: Max assist Static Sitting - Comment/# of Minutes: ~ 5 minutes; pt needed (A) to maintain balance due to right sided lean; pt unable to maintain midline  End of Session PT - End of Session Activity Tolerance: Other (comment) (Limited due to cognition and unable to follow commands ) Patient left: in bed;with call bell/phone within reach;with nursing/sitter in room Nurse Communication: Mobility status  GP     Kaelon Weekes 04/06/2013, 5:16 PM  Jake Shark, PT DPT [phone]

## 2013-04-06 NOTE — Progress Notes (Signed)
Utilization Review Completed.  

## 2013-04-06 NOTE — Evaluation (Signed)
Clinical/Bedside Swallow Evaluation Patient Details  Name: Kellie King MRN: [id number] Date of Birth: [birth year]  Today's Date: 04/06/2013 Time: 0950-     Past Medical History:  Past Medical History  Diagnosis Date  . CVA (cerebral vascular accident)     right  . Dementia   . Schizo affective schizophrenia   . Hypertension   . Thyroid disease   . Diabetes mellitus   . Anxiety   . Interstitial cystitis     has stimulator in her back  . Allergic rhinitis   . DDD (degenerative disc disease), lumbar   . Hypercholesterolemia   . Frequent PVCs   . Alzheimer disease   . Spinal stenosis     lumbar bulging discs   Past Surgical History:  Past Surgical History  Procedure Laterality Date  . Tubal ligation    . Interstitial cystitis      surgery for this  . Foot surgery      bilateral feet surgery on the bone  . 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 She was brought here by EMS, the patient is nonverbal chart called her daughter at [phone] with no response.   Assessment / Plan / Recommendation Clinical Impression  Oral care attempted, but difficult due to pt. clenching teeth together (bruxism noted).  Tongue is coated with a thick white substance, and teeth are broken and eroded to the gum line.  Pt. was able to take bites of applesauce and took sips of liquid via straw, without overt s/s of aspiration.  Pt. requires total assist and verbal/tactile cues with feeding.  Pt. refused further po's after only a few bites.    Aspiration Risk  Mild    Diet Recommendation Dysphagia 1 (Puree);Thin liquid   Liquid Administration via:  Straw Medication Administration: Crushed with puree Supervision: Staff feed patient;Full supervision/cueing for compensatory strategies Compensations: Slow rate;Small sips/bites;Check for pocketing Postural Changes and/or Swallow Maneuvers: Seated upright 90 degrees    Other  Recommendations Recommended Consults:  (Palliative care) Oral Care Recommendations: Oral care QID;Staff/trained caregiver to provide oral care Other Recommendations: Have oral suction available;Clarify dietary restrictions   Follow Up Recommendations  24 hour supervision/assistance;Skilled Nursing facility    Frequency and Duration min 1 x/week  2 weeks   Pertinent Vitals/Pain n/a    SLP Swallow Goals Patient will consume recommended diet without observed clinical signs of aspiration with: Maximum assistance   Swallow Study Prior Functional Status       General HPI: Resides in a skilled nursing facility with past medical history of dementia, right-sided CVA, 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 She was brought here by EMS, the patient is nonverbal chart called her daughter at [phone] with no response. Type of Study: Bedside swallow evaluation Previous Swallow Assessment: None in [facility] system Diet Prior to this Study: NPO Temperature Spikes Noted: Yes History of Recent Intubation: No Behavior/Cognition: Alert;Requires cueing;Doesn't follow directions;Decreased sustained attention;Distractible Oral Cavity - Dentition: Poor condition;Missing dentition (Tongue coated with thick white substance) Self-Feeding Abilities: Total assist Patient Positioning: Upright in bed (Contractures) Baseline  Vocal Quality: Clear Volitional Cough: Cognitively unable to elicit Volitional Swallow: Unable to elicit    Oral/Motor/Sensory Function Overall Oral  Motor/Sensory Function: Impaired at baseline Mandible: Impaired (Clenches teeth together, bruxism noted)   Ice Chips Ice chips: Within functional limits Presentation: Spoon   Thin Liquid Thin Liquid: Within functional limits Presentation: Straw    Nectar Thick Nectar Thick Liquid: Not tested   Honey Thick Honey Thick Liquid: Not tested   Puree Puree: Within functional limits Presentation: Spoon   Solid   GO    Solid: Not tested       Kellie King T 04/06/2013,10:32 AM

## 2013-04-07 LAB — BASIC METABOLIC PANEL
CO2: 19 mEq/L (ref 19–32)
Chloride: 123 mEq/L — ABNORMAL HIGH (ref 96–112)
GFR calc non Af Amer: 50 mL/min — ABNORMAL LOW (ref >90)
Glucose, Bld: 360 mg/dL — ABNORMAL HIGH (ref 70–99)
Potassium: 3.7 mEq/L (ref 3.5–5.1)
Sodium: 155 mEq/L — ABNORMAL HIGH (ref 135–145)

## 2013-04-07 LAB — GLUCOSE, CAPILLARY
Glucose-Capillary: 266 mg/dL — ABNORMAL HIGH (ref 70–99)
Glucose-Capillary: 199 mg/dL — ABNORMAL HIGH (ref 70–99)
Glucose-Capillary: 164 mg/dL — ABNORMAL HIGH (ref 70–99)
Glucose-Capillary: 127 mg/dL — ABNORMAL HIGH (ref 70–99)

## 2013-04-07 LAB — URINE CULTURE: Colony Count: NO GROWTH

## 2013-04-07 LAB — CBC
Hemoglobin: 14 g/dL (ref 12.0–15.0)
MCHC: 31.7 g/dL (ref 30.0–36.0)
RBC: 4.43 MIL/uL (ref 3.87–5.11)
WBC: 9.7 10*3/uL (ref 4.0–10.5)

## 2013-04-07 MED ORDER — RISPERIDONE 1 MG PO TABS
1.0000 mg | ORAL_TABLET | Freq: Every day | ORAL | Status: DC
  Administered 2013-04-07 – 2013-04-08 (×2): 1 mg via ORAL
  Filled 2013-04-07 (×3): qty 1

## 2013-04-07 NOTE — Progress Notes (Signed)
Patient resting in bed, very lethargic, Vitals signs stable will continue to monitor.

## 2013-04-07 NOTE — Progress Notes (Signed)
Patient WU:JWJXBJY ERIC MORGANTI      DOB: [birth year]      NWG:[id number]  No calls over night to return our request for scheduling GOC will retry family this am.  Reynalda Canny L. Ladona Ridgel, MD MBA The Palliative Medicine Team at [facility] Phone: [phone] Pager: [phone]

## 2013-04-07 NOTE — Progress Notes (Signed)
[facility] PROGRESS NOTE  Kellie King JXB:[id number] DOB: 29-May-1941 DOA: 04/05/2013 PCP: Cala Bradford, MD  Assessment/Plan: Septic shock/UTI/ Leukocytosis  - urine culture -rocephin -BP improved- will allow to be about 140s -urine very strong smelling  Acute kidney injury  - foley -IVF -I/O  Hypernatremia:  - D5 half: decrease rate -BMP q 12 At least 6 liters behind- will decrease slowly  Normocytic anemia  - Unclear etiology, she'll need colonoscopy as an outpatient  Hyperglycemia  -  sliding scale insulin.   ?dementia -?baseline -get swallow eval- DYS diet -goals of care with pallaitive -wean down risperidone as she got her home dose last night and is very  lethargic  Code Status: dnr Family Communication: daughter yesterday Disposition Plan: tx out to med surg   Consultants:  SLP  Procedures:    Antibiotics:    HPI/Subjective: Sleeping   Objective: Filed Vitals:   04/07/13 0400  BP: 122/54  Pulse: 77  Temp:   Resp: 20    Intake/Output Summary (Last 24 hours) at 04/07/13 0818 Last data filed at 04/07/13 0600  Gross per 24 hour  Intake   1250 ml  Output   1875 ml  Net   -625 ml   Filed Weights   04/05/13 1826 04/06/13 0435 04/07/13 0500  Weight: 48.8 kg (107 lb 9.4 oz) 49.1 kg (108 lb 3.9 oz) 51.2 kg (112 lb 14 oz)    Exam:   General:  Withdraws to pain, NAD, dry mucous membranes  Cardiovascular: rrr  Respiratory: clear anterior, no wheezing  Abdomen: +BS, soft  Musculoskeletal: no edema   Data Reviewed: Basic Metabolic Panel:  Recent Labs Lab 04/05/13 1224 04/05/13 1559 04/05/13 1900 04/06/13 0840 04/06/13 2220 04/07/13 0420  NA 163* 166* 164* 161* 157* 155*  K 3.9 3.8 4.5 3.7 3.7 3.7  CL 123* 128* 128* 127* 124* 123*  CO2 19  --  22 23 22 19   GLUCOSE 214* 211* 190* 236* 216* 360*  BUN 127* 129* 120* 84* 56* 49*  CREATININE 2.52* 2.60* 2.05* 1.40* 1.12* 1.07  CALCIUM 8.5  --  8.0* 8.1* 8.1*  8.2*  MG  --   --  3.1*  --   --   --    Liver Function Tests:  Recent Labs Lab 04/05/13 1224  AST 27  ALT 18  ALKPHOS 92  BILITOT 0.7  PROT 6.6  ALBUMIN 3.6   No results found for this basename: LIPASE, AMYLASE,  in the last 168 hours No results found for this basename: AMMONIA,  in the last 168 hours CBC:  Recent Labs Lab 04/05/13 1224 04/05/13 1559 04/05/13 1900 04/06/13 0840 04/07/13 0420  WBC 19.6*  --  19.6* 16.5* 9.7  NEUTROABS 17.9*  --   --   --   --   HGB 16.4* 16.3* 14.9 14.3 14.0  HCT 50.7* 48.0* 46.6* 44.3 44.1  MCV 98.6  --  99.4 99.3 99.5  PLT 149*  --  138* 104* 78*   Cardiac Enzymes:  Recent Labs Lab 04/05/13 1250  TROPONINI <0.30   BNP (last 3 results)  Recent Labs  04/05/13 1250  PROBNP 409.8*   CBG:  Recent Labs Lab 04/06/13 0434 04/06/13 0755 04/06/13 1127 04/06/13 1546 04/06/13 1956  GLUCAP 139* 192* 213* 241* 139*    Recent Results (from the past 240 hour(s))  URINE CULTURE     Status: None   Collection Time    04/05/13  1:52 PM  Result Value Range Status   Specimen Description URINE, RANDOM   Final   Special Requests NONE   Final   Culture  Setup Time     Final   Value: 04/06/2013 01:14     Performed at [facility] Count     Final   Value: >=100,000 COLONIES/ML     Performed at [facility]   Culture     Final   Value: GRAM NEGATIVE RODS     Performed at [facility]   Report Status PENDING   Incomplete  CULTURE, BLOOD (ROUTINE X 2)     Status: None   Collection Time    04/05/13  3:23 PM      Result Value Range Status   Specimen Description BLOOD HAND RIGHT   Final   Special Requests BOTTLES DRAWN AEROBIC ONLY .5CC   Final   Culture  Setup Time     Final   Value: 04/05/2013 22:09     Performed at [facility]   Culture     Final   Value:        BLOOD CULTURE RECEIVED NO GROWTH TO DATE CULTURE WILL BE HELD FOR 5 DAYS BEFORE ISSUING A FINAL NEGATIVE REPORT      Performed at [facility]   Report Status PENDING   Incomplete  CULTURE, BLOOD (ROUTINE X 2)     Status: None   Collection Time    04/05/13  3:45 PM      Result Value Range Status   Specimen Description BLOOD RIGHT HAND   Final   Special Requests BOTTLES DRAWN AEROBIC ONLY 1CC   Final   Culture  Setup Time     Final   Value: 04/05/2013 22:10     Performed at [facility]   Culture     Final   Value:        BLOOD CULTURE RECEIVED NO GROWTH TO DATE CULTURE WILL BE HELD FOR 5 DAYS BEFORE ISSUING A FINAL NEGATIVE REPORT     Performed at [facility]   Report Status PENDING   Incomplete  MRSA PCR SCREENING     Status: None   Collection Time    04/05/13  6:53 PM      Result Value Range Status   MRSA by PCR NEGATIVE  NEGATIVE Final   Comment:            The GeneXpert MRSA Assay (FDA     approved for NASAL specimens     only), is one component of a     comprehensive MRSA colonization     surveillance program. It is not     intended to diagnose MRSA     infection nor to guide or     monitor treatment for     MRSA infections.     Studies: Ct Head Wo Contrast  04/05/2013   *RADIOLOGY REPORT*  Clinical Data: Altered mental status  CT HEAD WITHOUT CONTRAST  Technique:  Contiguous axial images were obtained from the base of the skull through the vertex without contrast.  Comparison: 05/26/2011  Findings: Examination is significantly limited by patient motion artifact.  The bony calvarium appears within normal limits. Atrophic changes are noted.  There are also changes consistent with prior cerebellar infarcts bilaterally.  These are stable from prior exam.  A lacunar infarct is again noted in the basal ganglia on the right.  No acute hemorrhage or infarct is  seen.  IMPRESSION: Chronic changes without acute abnormality.   Original Report Authenticated By: Alcide Clever, M.D.   Dg Chest Port 1 View  04/05/2013   *RADIOLOGY REPORT*  Clinical Data: Short of breath, hypertension   PORTABLE CHEST - 1 VIEW  Comparison: Prior chest x-ray 03/29/2012  Findings: Cardiac leads overlie the chest.  The cardiac and mediastinal contours remain within normal limits.  There is atherosclerotic calcification in the transverse aorta. The lungs are well-aerated.  No edema, pleural effusion, pneumothorax or focal airspace consolidation.  No acute osseous abnormality. Degenerative changes noted in the bilateral shoulders.  IMPRESSION: No acute cardiopulmonary disease.   Original Report Authenticated By: Malachy Moan, M.D.    Scheduled Meds: . amLODipine  5 mg Oral Daily  . antiseptic oral rinse  15 mL Mouth Rinse q12n4p  . aspirin  81 mg Oral Daily  . cefTRIAXone (ROCEPHIN)  IV  1 g Intravenous Q24H  . chlorhexidine  15 mL Mouth Rinse BID  . feeding supplement  237 mL Oral BID BM  . heparin  5,000 Units Subcutaneous Q8H  . insulin aspart  0-5 Units Subcutaneous QHS  . insulin aspart  0-9 Units Subcutaneous TID WC  . levothyroxine  50 mcg Oral QAC breakfast  . mirtazapine  15 mg Oral QHS  . risperiDONE  3 mg Oral QHS  . venlafaxine XR  75 mg Oral BID   Continuous Infusions: . dextrose 5 % and 0.45% NaCl 1,000 mL (04/06/13 2112)    Principal Problem:   Septic shock Active Problems:   Dementia   UTI (lower urinary tract infection)   Leukocytosis   High anion gap metabolic acidosis   Acute kidney injury   Normocytic anemia   Hyperglycemia   Hypernatremia    Time spent: 35    [facility], Dinita Migliaccio  [facility] Pager [phone]. If 7PM-7AM, please contact night-coverage at [email or web address], password [facility] 04/07/2013, 8:18 AM  LOS: 2 days

## 2013-04-08 LAB — GLUCOSE, CAPILLARY
Glucose-Capillary: 160 mg/dL — ABNORMAL HIGH (ref 70–99)
Glucose-Capillary: 174 mg/dL — ABNORMAL HIGH (ref 70–99)

## 2013-04-08 LAB — BASIC METABOLIC PANEL
CO2: 26 mEq/L (ref 19–32)
Calcium: 8.1 mg/dL — ABNORMAL LOW (ref 8.4–10.5)
GFR calc non Af Amer: 85 mL/min — ABNORMAL LOW (ref >90)
Sodium: 151 mEq/L — ABNORMAL HIGH (ref 135–145)

## 2013-04-08 LAB — CBC
MCH: 31.8 pg (ref 26.0–34.0)
Platelets: 93 10*3/uL — ABNORMAL LOW (ref 150–400)
RBC: 4.18 MIL/uL (ref 3.87–5.11)

## 2013-04-08 MED ORDER — LISINOPRIL 5 MG PO TABS
5.0000 mg | ORAL_TABLET | Freq: Every day | ORAL | Status: DC
  Administered 2013-04-08 – 2013-04-17 (×7): 5 mg via ORAL
  Filled 2013-04-08 (×12): qty 1

## 2013-04-08 NOTE — Progress Notes (Signed)
Patient WU:JWJXBJY FRADEL BALDONADO      DOB: 1940-09-04      NWG:[id number]  Left another message on the number available for her daughter yesterday and stopped by in the afternoon.  No family at the bedside per nursing confirmed none present during the shift.  Will continue to try to interface with this family.   Kullen Tomasetti L. Ladona Ridgel, MD MBA The Palliative Medicine Team at [facility] Phone: [phone] Pager: [phone]

## 2013-04-08 NOTE — Progress Notes (Signed)
[facility] PROGRESS NOTE  Kellie King WUJ:[id number] DOB: Jan 13, 1941 DOA: 04/05/2013 PCP: Cala Bradford, MD  Assessment/Plan: Septic shock/UTI/ Leukocytosis  - urine culture- ecoli -rocephin  Acute kidney injury  - foley -IVF -I/O  Hypernatremia:  - D5 half: decrease rate -BMP q 12 At least 6 liters behind- will decrease slowly  Normocytic anemia  - Unclear etiology, she'll need colonoscopy as an outpatient  Hyperglycemia  -  sliding scale insulin.   ?dementia -?baseline -get swallow eval- DYS diet -goals of care with pallaitive -wean down risperidone as she got her home dose last night and is very  lethargic  Code Status: dnr Family Communication: no family Disposition Plan: tx out to med surg   Consultants:  SLP  Procedures:    Antibiotics:    HPI/Subjective: Awake, says a few words  Objective: Filed Vitals:   04/08/13 0815  BP: 171/83  Pulse: 70  Temp: 98 F (36.7 C)  Resp: 23    Intake/Output Summary (Last 24 hours) at 04/08/13 0841 Last data filed at 04/08/13 0800  Gross per 24 hour  Intake    550 ml  Output   1600 ml  Net  -1050 ml   Filed Weights   04/06/13 0435 04/07/13 0500 04/08/13 0354  Weight: 49.1 kg (108 lb 3.9 oz) 51.2 kg (112 lb 14 oz) 51.1 kg (112 lb 10.5 oz)    Exam:   General:  Withdraws to pain, NAD, dry mucous membranes  Cardiovascular: rrr  Respiratory: clear anterior, no wheezing  Abdomen: +BS, soft  Musculoskeletal: no edema   Data Reviewed: Basic Metabolic Panel:  Recent Labs Lab 04/05/13 1900 04/06/13 0840 04/06/13 2220 04/07/13 0420 04/08/13 0444  NA 164* 161* 157* 155* 151*  K 4.5 3.7 3.7 3.7 3.5  CL 128* 127* 124* 123* 116*  CO2 22 23 22 19 26   GLUCOSE 190* 236* 216* 360* 164*  BUN 120* 84* 56* 49* 21  CREATININE 2.05* 1.40* 1.12* 1.07 0.65  CALCIUM 8.0* 8.1* 8.1* 8.2* 8.1*  MG 3.1*  --   --   --   --    Liver Function Tests:  Recent Labs Lab 04/05/13 1224  AST 27   ALT 18  ALKPHOS 92  BILITOT 0.7  PROT 6.6  ALBUMIN 3.6   No results found for this basename: LIPASE, AMYLASE,  in the last 168 hours No results found for this basename: AMMONIA,  in the last 168 hours CBC:  Recent Labs Lab 04/05/13 1224 04/05/13 1559 04/05/13 1900 04/06/13 0840 04/07/13 0420 04/08/13 0444  WBC 19.6*  --  19.6* 16.5* 9.7 6.9  NEUTROABS 17.9*  --   --   --   --   --   HGB 16.4* 16.3* 14.9 14.3 14.0 13.3  HCT 50.7* 48.0* 46.6* 44.3 44.1 40.5  MCV 98.6  --  99.4 99.3 99.5 96.9  PLT 149*  --  138* 104* 78* 93*   Cardiac Enzymes:  Recent Labs Lab 04/05/13 1250  TROPONINI <0.30   BNP (last 3 results)  Recent Labs  04/05/13 1250  PROBNP 409.8*   CBG:  Recent Labs Lab 04/07/13 0829 04/07/13 1148 04/07/13 1708 04/07/13 2133 04/08/13 0745  GLUCAP 266* 199* 164* 127* 134*    Recent Results (from the past 240 hour(s))  URINE CULTURE     Status: None   Collection Time    04/05/13  1:52 PM      Result Value Range Status   Specimen Description URINE, RANDOM  Final   Special Requests NONE   Final   Culture  Setup Time     Final   Value: 04/06/2013 01:14     Performed at [facility] Count     Final   Value: >=100,000 COLONIES/ML     Performed at [facility]   Culture     Final   Value: ESCHERICHIA COLI     Performed at [facility]   Report Status 04/07/2013 FINAL   Final   Organism ID, Bacteria ESCHERICHIA COLI   Final  CULTURE, BLOOD (ROUTINE X 2)     Status: None   Collection Time    04/05/13  3:23 PM      Result Value Range Status   Specimen Description BLOOD HAND RIGHT   Final   Special Requests BOTTLES DRAWN AEROBIC ONLY .5CC   Final   Culture  Setup Time     Final   Value: 04/05/2013 22:09     Performed at [facility]   Culture     Final   Value:        BLOOD CULTURE RECEIVED NO GROWTH TO DATE CULTURE WILL BE HELD FOR 5 DAYS BEFORE ISSUING A FINAL NEGATIVE REPORT     Performed at  [facility]   Report Status PENDING   Incomplete  CULTURE, BLOOD (ROUTINE X 2)     Status: None   Collection Time    04/05/13  3:45 PM      Result Value Range Status   Specimen Description BLOOD RIGHT HAND   Final   Special Requests BOTTLES DRAWN AEROBIC ONLY 1CC   Final   Culture  Setup Time     Final   Value: 04/05/2013 22:10     Performed at [facility]   Culture     Final   Value:        BLOOD CULTURE RECEIVED NO GROWTH TO DATE CULTURE WILL BE HELD FOR 5 DAYS BEFORE ISSUING A FINAL NEGATIVE REPORT     Performed at [facility]   Report Status PENDING   Incomplete  MRSA PCR SCREENING     Status: None   Collection Time    04/05/13  6:53 PM      Result Value Range Status   MRSA by PCR NEGATIVE  NEGATIVE Final   Comment:            The GeneXpert MRSA Assay (FDA     approved for NASAL specimens     only), is one component of a     comprehensive MRSA colonization     surveillance program. It is not     intended to diagnose MRSA     infection nor to guide or     monitor treatment for     MRSA infections.  URINE CULTURE     Status: None   Collection Time    04/06/13  8:49 AM      Result Value Range Status   Specimen Description URINE, CATHETERIZED   Final   Special Requests NONE   Final   Culture  Setup Time     Final   Value: 04/06/2013 09:16     Performed at [facility]   Colony Count     Final   Value: NO GROWTH     Performed at [facility]   Culture     Final   Value: NO GROWTH  Performed at [facility]   Report Status 04/07/2013 FINAL   Final     Studies: No results found.  Scheduled Meds: . amLODipine  5 mg Oral Daily  . antiseptic oral rinse  15 mL Mouth Rinse q12n4p  . aspirin  81 mg Oral Daily  . cefTRIAXone (ROCEPHIN)  IV  1 g Intravenous Q24H  . chlorhexidine  15 mL Mouth Rinse BID  . feeding supplement  237 mL Oral BID BM  . insulin aspart  0-5 Units Subcutaneous QHS  . insulin aspart  0-9 Units  Subcutaneous TID WC  . levothyroxine  50 mcg Oral QAC breakfast  . risperiDONE  1 mg Oral QHS   Continuous Infusions: . dextrose 5 % and 0.45% NaCl 50 mL/hr (04/07/13 0981)    Principal Problem:   Septic shock Active Problems:   Dementia   UTI (lower urinary tract infection)   Leukocytosis   High anion gap metabolic acidosis   Acute kidney injury   Normocytic anemia   Hyperglycemia   Hypernatremia    Time spent: 35    [facility], JESSICA  [facility] Pager [phone]. If 7PM-7AM, please contact night-coverage at [email or web address], password [facility] 04/08/2013, 8:41 AM  LOS: 3 days

## 2013-04-09 LAB — BASIC METABOLIC PANEL
BUN: 15 mg/dL (ref 6–23)
Chloride: 108 mEq/L (ref 96–112)
GFR calc Af Amer: >90 mL/min (ref >90)
Glucose, Bld: 166 mg/dL — ABNORMAL HIGH (ref 70–99)
Potassium: 3.2 mEq/L — ABNORMAL LOW (ref 3.5–5.1)

## 2013-04-09 LAB — CBC
Hemoglobin: 13.7 g/dL (ref 12.0–15.0)
MCHC: 32.9 g/dL (ref 30.0–36.0)

## 2013-04-09 LAB — GLUCOSE, CAPILLARY
Glucose-Capillary: 162 mg/dL — ABNORMAL HIGH (ref 70–99)
Glucose-Capillary: 151 mg/dL — ABNORMAL HIGH (ref 70–99)

## 2013-04-09 MED ORDER — WHITE PETROLATUM GEL
Status: AC
  Administered 2013-04-09: 0.2
  Filled 2013-04-09: qty 5

## 2013-04-09 MED ORDER — POTASSIUM CHLORIDE 10 MEQ/100ML IV SOLN
10.0000 meq | INTRAVENOUS | Status: AC
  Administered 2013-04-09 (×4): 10 meq via INTRAVENOUS
  Filled 2013-04-09 (×2): qty 100
  Filled 2013-04-09: qty 200

## 2013-04-09 MED ORDER — RISPERIDONE 0.5 MG PO TABS
0.5000 mg | ORAL_TABLET | Freq: Every day | ORAL | Status: DC
  Administered 2013-04-09 – 2013-04-16 (×8): 0.5 mg via ORAL
  Filled 2013-04-09 (×9): qty 1

## 2013-04-09 NOTE — Progress Notes (Addendum)
[facility] PROGRESS NOTE  Kellie King WUJ:[id number] DOB: 1941-07-08 DOA: 04/05/2013 PCP: Cala Bradford, MD  Assessment/Plan: Septic shock/UTI/ Leukocytosis  - urine culture- ecoli -rocephin- day number 5- will d/c  Acute kidney injury  - foley -IVF -I/O  Hypernatremia:  - D5 half: decrease rate -BMP daily   Normocytic anemia  - Unclear etiology, she'll need colonoscopy as an outpatient  Hyperglycemia  -  sliding scale insulin.   ?dementia -?baseline -get swallow eval- DYS diet -goals of care with pallaitive -wean down risperidone as she is lethargic  SCDs as plts low  Code Status: dnr Family Communication: no family Disposition Plan: SNF- family not available by phone   Consultants:  SLP  Procedures:    Antibiotics:    HPI/Subjective: sleeping  Objective: Filed Vitals:   04/09/13 0724  BP: 135/61  Pulse: 61  Temp: 98.7 F (37.1 C)  Resp: 18    Intake/Output Summary (Last 24 hours) at 04/09/13 0832 Last data filed at 04/09/13 0600  Gross per 24 hour  Intake   1180 ml  Output   1050 ml  Net    130 ml   Filed Weights   04/07/13 0500 04/08/13 0354 04/09/13 0724  Weight: 51.2 kg (112 lb 14 oz) 51.1 kg (112 lb 10.5 oz) 52 kg (114 lb 10.2 oz)    Exam:   General:  Withdraws to pain, NAD, dry mucous membranes  Cardiovascular: rrr  Respiratory: clear anterior, no wheezing  Abdomen: +BS, soft  Musculoskeletal: no edema   Data Reviewed: Basic Metabolic Panel:  Recent Labs Lab 04/05/13 1900 04/06/13 0840 04/06/13 2220 04/07/13 0420 04/08/13 0444 04/09/13 0520  NA 164* 161* 157* 155* 151* 141  K 4.5 3.7 3.7 3.7 3.5 3.2*  CL 128* 127* 124* 123* 116* 108  CO2 22 23 22 19 26 25   GLUCOSE 190* 236* 216* 360* 164* 166*  BUN 120* 84* 56* 49* 21 15  CREATININE 2.05* 1.40* 1.12* 1.07 0.65 0.67  CALCIUM 8.0* 8.1* 8.1* 8.2* 8.1* 7.9*  MG 3.1*  --   --   --   --   --    Liver Function Tests:  Recent Labs Lab  04/05/13 1224  AST 27  ALT 18  ALKPHOS 92  BILITOT 0.7  PROT 6.6  ALBUMIN 3.6   No results found for this basename: LIPASE, AMYLASE,  in the last 168 hours No results found for this basename: AMMONIA,  in the last 168 hours CBC:  Recent Labs Lab 04/05/13 1224 04/05/13 1559 04/05/13 1900 04/06/13 0840 04/07/13 0420 04/08/13 0444  WBC 19.6*  --  19.6* 16.5* 9.7 6.9  NEUTROABS 17.9*  --   --   --   --   --   HGB 16.4* 16.3* 14.9 14.3 14.0 13.3  HCT 50.7* 48.0* 46.6* 44.3 44.1 40.5  MCV 98.6  --  99.4 99.3 99.5 96.9  PLT 149*  --  138* 104* 78* 93*   Cardiac Enzymes:  Recent Labs Lab 04/05/13 1250  TROPONINI <0.30   BNP (last 3 results)  Recent Labs  04/05/13 1250  PROBNP 409.8*   CBG:  Recent Labs Lab 04/08/13 0745 04/08/13 1205 04/08/13 1701 04/08/13 2207 04/09/13 0807  GLUCAP 134* 160* 93 174* 162*    Recent Results (from the past 240 hour(s))  URINE CULTURE     Status: None   Collection Time    04/05/13  1:52 PM      Result Value Range Status   Specimen Description  URINE, RANDOM   Final   Special Requests NONE   Final   Culture  Setup Time     Final   Value: 04/06/2013 01:14     Performed at [facility] Count     Final   Value: >=100,000 COLONIES/ML     Performed at [facility]   Culture     Final   Value: ESCHERICHIA COLI     Performed at [facility]   Report Status 04/07/2013 FINAL   Final   Organism ID, Bacteria ESCHERICHIA COLI   Final  CULTURE, BLOOD (ROUTINE X 2)     Status: None   Collection Time    04/05/13  3:23 PM      Result Value Range Status   Specimen Description BLOOD HAND RIGHT   Final   Special Requests BOTTLES DRAWN AEROBIC ONLY .5CC   Final   Culture  Setup Time     Final   Value: 04/05/2013 22:09     Performed at [facility]   Culture     Final   Value:        BLOOD CULTURE RECEIVED NO GROWTH TO DATE CULTURE WILL BE HELD FOR 5 DAYS BEFORE ISSUING A FINAL NEGATIVE REPORT      Performed at [facility]   Report Status PENDING   Incomplete  CULTURE, BLOOD (ROUTINE X 2)     Status: None   Collection Time    04/05/13  3:45 PM      Result Value Range Status   Specimen Description BLOOD RIGHT HAND   Final   Special Requests BOTTLES DRAWN AEROBIC ONLY 1CC   Final   Culture  Setup Time     Final   Value: 04/05/2013 22:10     Performed at [facility]   Culture     Final   Value:        BLOOD CULTURE RECEIVED NO GROWTH TO DATE CULTURE WILL BE HELD FOR 5 DAYS BEFORE ISSUING A FINAL NEGATIVE REPORT     Performed at [facility]   Report Status PENDING   Incomplete  MRSA PCR SCREENING     Status: None   Collection Time    04/05/13  6:53 PM      Result Value Range Status   MRSA by PCR NEGATIVE  NEGATIVE Final   Comment:            The GeneXpert MRSA Assay (FDA     approved for NASAL specimens     only), is one component of a     comprehensive MRSA colonization     surveillance program. It is not     intended to diagnose MRSA     infection nor to guide or     monitor treatment for     MRSA infections.  URINE CULTURE     Status: None   Collection Time    04/06/13  8:49 AM      Result Value Range Status   Specimen Description URINE, CATHETERIZED   Final   Special Requests NONE   Final   Culture  Setup Time     Final   Value: 04/06/2013 09:16     Performed at [facility]   Colony Count     Final   Value: NO GROWTH     Performed at [facility]   Culture     Final   Value: NO GROWTH  Performed at [facility]   Report Status 04/07/2013 FINAL   Final     Studies: No results found.  Scheduled Meds: . amLODipine  5 mg Oral Daily  . antiseptic oral rinse  15 mL Mouth Rinse q12n4p  . aspirin  81 mg Oral Daily  . cefTRIAXone (ROCEPHIN)  IV  1 g Intravenous Q24H  . chlorhexidine  15 mL Mouth Rinse BID  . feeding supplement  237 mL Oral BID BM  . insulin aspart  0-5 Units Subcutaneous QHS  . insulin  aspart  0-9 Units Subcutaneous TID WC  . levothyroxine  50 mcg Oral QAC breakfast  . lisinopril  5 mg Oral Daily  . potassium chloride  10 mEq Intravenous Q1 Hr x 4  . risperiDONE  1 mg Oral QHS   Continuous Infusions: . dextrose 5 % and 0.45% NaCl 50 mL/hr at 04/08/13 2120    Principal Problem:   Septic shock Active Problems:   Dementia   UTI (lower urinary tract infection)   Leukocytosis   High anion gap metabolic acidosis   Acute kidney injury   Normocytic anemia   Hyperglycemia   Hypernatremia    Time spent: 35    [facility], Adebayo Ensminger  [facility] Pager [phone]. If 7PM-7AM, please contact night-coverage at [email or web address], password [facility] 04/09/2013, 8:32 AM  LOS: 4 days

## 2013-04-09 NOTE — Progress Notes (Signed)
Speech Language Pathology Dysphagia Treatment Patient Details Name: Kellie King MRN: [id number] DOB: 1940/09/27 Today's Date: 04/09/2013 Time: 2595-6387 SLP Time Calculation (min): 11 min  Assessment / Plan / Recommendation Clinical Impression  Pt seen for check of diet tolerance . Pt was awake and accpeted POs though max tactile cues needed to facilitate negative pressure for straw sips. No evidence of aspiration, pt tolerating current diet though expect intake to be limited. No SLP f/u needed at this time, continue Dys 1 (puree) with thin liquids and total assist with frequent oral care.     Diet Recommendation  Continue with Current Diet: Dysphagia 1 (puree);Thin liquid    SLP Plan All goals met   Pertinent Vitals/Pain NA   Swallowing Goals  SLP Swallowing Goals Patient will consume recommended diet without observed clinical signs of aspiration with: Maximum assistance Swallow Study Goal #1 - Progress: Met  General Temperature Spikes Noted: No Respiratory Status: Room air Behavior/Cognition: Alert;Requires cueing;Doesn't follow directions;Decreased sustained attention;Distractible Oral Cavity - Dentition: Poor condition;Missing dentition Patient Positioning: Upright in bed  Oral Cavity - Oral Hygiene Does patient have any of the following "at risk" factors?: Nutritional status - dependent feeder Patient is AT RISK - Oral Care Protocol followed (see row info): Yes   Dysphagia Treatment Treatment focused on: Skilled observation of diet tolerance;Facilitation of oral preparatory phase Treatment Methods/Modalities: Skilled observation Patient observed directly with PO's: Yes Type of PO's observed: Dysphagia 1 (puree);Thin liquids Feeding: Total assist Liquids provided via: Cup;Straw Oral Phase Signs & Symptoms: Prolonged oral phase Type of cueing: Verbal;Tactile Amount of cueing: Maximal   GO    Kellie Ditty, MA CCC-SLP [phone]  Kellie King 04/09/2013, 3:48 PM

## 2013-04-09 NOTE — Progress Notes (Signed)
Chaplain provided a ministry of presence to the patient. The patient was asleep. Chaplain will follow up as needed and requested.   04/09/13 1038  Clinical Encounter Type  Visited With Patient  Visit Type Initial  Referral From Palliative care team  Stress Factors  Patient Stress Factors None identified

## 2013-04-09 NOTE — Progress Notes (Addendum)
Thank you for consulting the Palliative Medicine Team at [facility] to meet your patient's and family's needs.   The reason that you asked [place] to see your patient is for goals of care  *call back from patient's daughter Kellie King to team phone received this afternoon  PMT meeting scheduled: Tuesday  04/10/13 @ 11:30 am  The Surrogate decision maker is: daughter Kellie King [phone]  Other family members that need to be present: none per daughter  Your patient is unable to participate:  Additional Narrative: per daughter Kellie King she is the [employer] for patient she has a sister who lives in [place] who is POA   [name], RN 04/09/2013, 12:44 PM Palliative Medicine Team RN Liaison [phone]

## 2013-04-10 DIAGNOSIS — R4182 Altered mental status, unspecified: Secondary | ICD-10-CM

## 2013-04-10 DIAGNOSIS — Z515 Encounter for palliative care: Secondary | ICD-10-CM

## 2013-04-10 DIAGNOSIS — R627 Adult failure to thrive: Secondary | ICD-10-CM

## 2013-04-10 LAB — BASIC METABOLIC PANEL
BUN: 11 mg/dL (ref 6–23)
CO2: 25 mEq/L (ref 19–32)
Chloride: 108 mEq/L (ref 96–112)
GFR calc non Af Amer: >90 mL/min (ref >90)
Glucose, Bld: 158 mg/dL — ABNORMAL HIGH (ref 70–99)
Potassium: 3.5 mEq/L (ref 3.5–5.1)

## 2013-04-10 NOTE — Progress Notes (Signed)
[facility] PROGRESS NOTE  Kellie King:[id number] DOB: Nov 02, 1940 DOA: 04/05/2013 PCP: Cala Bradford, MD  Assessment/Plan: Septic shock/UTI/ Leukocytosis  - urine culture- ecoli -rocephin- day number 5- will d/c  Acute kidney injury  - foley -IVF -I/O  Hypernatremia:  - D5 half: decrease rate -BMP daily -lab having trouble drawing her blood  Normocytic anemia  - Unclear etiology, she'll need colonoscopy as an outpatient  Hyperglycemia  -  sliding scale insulin.   ?dementia -?baseline -get swallow eval- DYS diet -goals of care with pallaitive -wean down risperidone as she is lethargic in the AM  SCDs as plts low  Code Status: dnr Family Communication: no family Disposition Plan: SNF- after palliative care meeting   Consultants:  SLP  Procedures:    Antibiotics:    HPI/Subjective: Sleepy in AM -awake around 3PM- weaned down risperdol  Objective: Filed Vitals:   04/10/13 0540  BP: 127/51  Pulse: 68  Temp: 98.4 F (36.9 C)  Resp: 16    Intake/Output Summary (Last 24 hours) at 04/10/13 0807 Last data filed at 04/10/13 0500  Gross per 24 hour  Intake   1700 ml  Output   1700 ml  Net      0 ml   Filed Weights   04/08/13 0354 04/09/13 0724 04/10/13 0500  Weight: 51.1 kg (112 lb 10.5 oz) 52 kg (114 lb 10.2 oz) 52.5 kg (115 lb 11.9 oz)    Exam:   General:  Withdraws to pain, NAD, dry mucous membranes  Cardiovascular: rrr  Respiratory: clear anterior, no wheezing  Abdomen: +BS, soft  Musculoskeletal: no edema   Data Reviewed: Basic Metabolic Panel:  Recent Labs Lab 04/05/13 1900 04/06/13 0840 04/06/13 2220 04/07/13 0420 04/08/13 0444 04/09/13 0520  NA 164* 161* 157* 155* 151* 141  K 4.5 3.7 3.7 3.7 3.5 3.2*  CL 128* 127* 124* 123* 116* 108  CO2 22 23 22 19 26 25   GLUCOSE 190* 236* 216* 360* 164* 166*  BUN 120* 84* 56* 49* 21 15  CREATININE 2.05* 1.40* 1.12* 1.07 0.65 0.67  CALCIUM 8.0* 8.1* 8.1* 8.2*  8.1* 7.9*  MG 3.1*  --   --   --   --   --    Liver Function Tests:  Recent Labs Lab 04/05/13 1224  AST 27  ALT 18  ALKPHOS 92  BILITOT 0.7  PROT 6.6  ALBUMIN 3.6   No results found for this basename: LIPASE, AMYLASE,  in the last 168 hours No results found for this basename: AMMONIA,  in the last 168 hours CBC:  Recent Labs Lab 04/05/13 1224  04/05/13 1900 04/06/13 0840 04/07/13 0420 04/08/13 0444 04/09/13 0835  WBC 19.6*  --  19.6* 16.5* 9.7 6.9 6.0  NEUTROABS 17.9*  --   --   --   --   --   --   HGB 16.4*  < > 14.9 14.3 14.0 13.3 13.7  HCT 50.7*  < > 46.6* 44.3 44.1 40.5 41.6  MCV 98.6  --  99.4 99.3 99.5 96.9 94.3  PLT 149*  --  138* 104* 78* 93* 66*  < > = values in this interval not displayed. Cardiac Enzymes:  Recent Labs Lab 04/05/13 1250  TROPONINI <0.30   BNP (last 3 results)  Recent Labs  04/05/13 1250  PROBNP 409.8*   CBG:  Recent Labs Lab 04/09/13 1201 04/09/13 1654 04/09/13 2139 04/09/13 2221 04/10/13 0751  GLUCAP 151* 140* 186* 180* 163*    Recent Results (  from the past 240 hour(s))  URINE CULTURE     Status: None   Collection Time    04/05/13  1:52 PM      Result Value Range Status   Specimen Description URINE, RANDOM   Final   Special Requests NONE   Final   Culture  Setup Time     Final   Value: 04/06/2013 01:14     Performed at [facility] Count     Final   Value: >=100,000 COLONIES/ML     Performed at [facility]   Culture     Final   Value: ESCHERICHIA COLI     Performed at [facility]   Report Status 04/07/2013 FINAL   Final   Organism ID, Bacteria ESCHERICHIA COLI   Final  CULTURE, BLOOD (ROUTINE X 2)     Status: None   Collection Time    04/05/13  3:23 PM      Result Value Range Status   Specimen Description BLOOD HAND RIGHT   Final   Special Requests BOTTLES DRAWN AEROBIC ONLY .5CC   Final   Culture  Setup Time     Final   Value: 04/05/2013 22:09     Performed at [facility]   Culture     Final   Value:        BLOOD CULTURE RECEIVED NO GROWTH TO DATE CULTURE WILL BE HELD FOR 5 DAYS BEFORE ISSUING A FINAL NEGATIVE REPORT     Performed at [facility]   Report Status PENDING   Incomplete  CULTURE, BLOOD (ROUTINE X 2)     Status: None   Collection Time    04/05/13  3:45 PM      Result Value Range Status   Specimen Description BLOOD RIGHT HAND   Final   Special Requests BOTTLES DRAWN AEROBIC ONLY 1CC   Final   Culture  Setup Time     Final   Value: 04/05/2013 22:10     Performed at [facility]   Culture     Final   Value:        BLOOD CULTURE RECEIVED NO GROWTH TO DATE CULTURE WILL BE HELD FOR 5 DAYS BEFORE ISSUING A FINAL NEGATIVE REPORT     Performed at [facility]   Report Status PENDING   Incomplete  MRSA PCR SCREENING     Status: None   Collection Time    04/05/13  6:53 PM      Result Value Range Status   MRSA by PCR NEGATIVE  NEGATIVE Final   Comment:            The GeneXpert MRSA Assay (FDA     approved for NASAL specimens     only), is one component of a     comprehensive MRSA colonization     surveillance program. It is not     intended to diagnose MRSA     infection nor to guide or     monitor treatment for     MRSA infections.  URINE CULTURE     Status: None   Collection Time    04/06/13  8:49 AM      Result Value Range Status   Specimen Description URINE, CATHETERIZED   Final   Special Requests NONE   Final   Culture  Setup Time     Final   Value: 04/06/2013 09:16     Performed at [facility]   Colony Count     Final   Value: NO GROWTH     Performed at [facility]   Culture     Final   Value: NO GROWTH     Performed at [facility]   Report Status 04/07/2013 FINAL   Final     Studies: No results found.  Scheduled Meds: . amLODipine  5 mg Oral Daily  . aspirin  81 mg Oral Daily  . feeding supplement  237 mL Oral BID BM  . insulin aspart  0-5 Units  Subcutaneous QHS  . insulin aspart  0-9 Units Subcutaneous TID WC  . levothyroxine  50 mcg Oral QAC breakfast  . lisinopril  5 mg Oral Daily  . risperiDONE  0.5 mg Oral QHS   Continuous Infusions: . dextrose 5 % and 0.45% NaCl 50 mL/hr at 04/09/13 1633    Principal Problem:   Septic shock Active Problems:   Dementia   UTI (lower urinary tract infection)   Leukocytosis   High anion gap metabolic acidosis   Acute kidney injury   Normocytic anemia   Hyperglycemia   Hypernatremia    Time spent: 35    [facility], Arn Mcomber  [facility] Pager [phone]. If 7PM-7AM, please contact night-coverage at [email or web address], password [facility] 04/10/2013, 8:07 AM  LOS: 5 days

## 2013-04-10 NOTE — Consult Note (Signed)
Patient YN:[id number] Kellie King      DOB: 01-Mar-1941      HYQ:[id number]     Consult Note from the Palliative Medicine Team at [facility]    Consult Requested by: Dr Benjamine Mola     PCP: Cala Bradford, MD Reason for Consultation: Clarifiction of GOC and options     Phone Number:[phone]  Assessment of patients Current state:  72 yo female was living with daughter Kellie King with reported continued functional and cognitive decline.   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  Minimal ambulation, incontinence, worsening memory.   Consult is for review of medical treatment options, clarification of goals of care and end of life issues, disposition and options, and symptom recommendation.  This NP Kellie King reviewed medical records, received report from team, assessed the patient and then meet at the patient's bedside along with her daughter Kellie King # [phone]  to discuss diagnosis prognosis, GOC, EOL wishes disposition and options.   A detailed discussion was had today regarding advanced directives.  Concepts specific to code status, artifical feeding and hydration, continued IV antibiotics and rehospitalization was had.  The difference between a aggressive medical intervention path  and a palliative comfort care path for this patient at this time was had.  Values and goals of care important to patient and family were attempted to be elicited.  Concept of Hospice and Palliative Care were discussed  Natural trajectory and expectations at EOL were discussed.  Questions and concerns addressed.  Hard Choices booklet left for review. Family encouraged to call with questions or concerns.  PMT will continue to support holistically.  Call placed to sister in  [place] unable to contact, will continue to attempt     Goals of Care: 1.  Code Status: DNR/DNI   2. Scope of Treatment:  Continue present treatment plan until both daughters have the chance to discuss present medical situation.  Daughter Kellie King lives in [place], will continue to attempt contact in order to clarify GOC  4. Disposition:  Pending decisions from family.  Daughter in [place] is being contacted and a tentative GOC meeting is scheduled for 04-13-11 at 2pm    3. Symptom Management:   1. Anxiety/Agitation:Risperdal 0.5 mg qhs 2. Adult failure to thrive/weakness  4. Psychosocial:  Emotional support offered.  Kellie King verbalizes that she is surprised by the rapid decline and is concerned about anticipatory care needs.  Will continue to support holistically  5. Spiritual: Support from daughter [employer].  Chaplain Kellie King present for GOC meeting        Patient Documents Completed or Given: Document Given Completed  Advanced Directives Pkt    MOST    DNR    Gone from My Sight    Hard Choices yes     Brief HPI:72 yo female was living with daughter Kellie King with reported continued functional and cognitive decline.   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  Minimal ambulation, incontinence, worsening memory.    DGU:YQIHKV to illicit secondary to altered cognition    PMH:  Past Medical History  Diagnosis Date  .  CVA (cerebral vascular accident)     right  . Dementia   . Schizo affective schizophrenia   . Hypertension   . Thyroid disease   . Diabetes mellitus   . Anxiety   . Interstitial cystitis     has stimulator in her back  . Allergic rhinitis   . DDD (degenerative disc disease), lumbar   . Hypercholesterolemia    . Frequent PVCs   . Alzheimer disease   . Spinal stenosis     lumbar bulging discs     PSH: Past Surgical History  Procedure Laterality Date  . Tubal ligation    . Interstitial cystitis      surgery for this  . Foot surgery      bilateral feet surgery on the bone  . Implantation / placement epidural neurostimulator electrodes     I have reviewed the FH and SH and  If appropriate update it with new information. Allergies  Allergen Reactions  . Atenolol     Bradycardia   . Other     Patient is allergic to a medicine for alzheimer  But does not know name  . Penicillins     ?   Scheduled Meds: . amLODipine  5 mg Oral Daily  . aspirin  81 mg Oral Daily  . feeding supplement  237 mL Oral BID BM  . insulin aspart  0-5 Units Subcutaneous QHS  . insulin aspart  0-9 Units Subcutaneous TID WC  . levothyroxine  50 mcg Oral QAC breakfast  . lisinopril  5 mg Oral Daily  . risperiDONE  0.5 mg Oral QHS   Continuous Infusions: . dextrose 5 % and 0.45% NaCl 20 mL/hr at 04/10/13 0827   PRN Meds:.hydrALAZINE, polyethylene glycol    BP 127/51  Pulse 68  Temp(Src) 98.4 F (36.9 C) (Oral)  Resp 16  Ht 5\' 4"  (1.626 m)  Wt 52.5 kg (115 lb 11.9 oz)  BMI 19.86 kg/m2  SpO2 96%   PPS: 20 %   Intake/Output Summary (Last 24 hours) at 04/10/13 1301 Last data filed at 04/10/13 0934  Gross per 24 hour  Intake   1395 ml  Output   1700 ml  Net   -305 ml    Physical Exam:  General: ill appearing, minimally responsive, unable to follow commands HEENT:  Mm, no exudate Chest:   Diminished in bases, CTA CVS: RRR Abdomen:soft NT +BS Ext: LUE contracted with muscle atrophy Neuro: minimally responsive, non verbal presently, unable to follow commands  Labs: CBC    Component Value Date/Time   WBC 6.0 04/09/2013 0835   RBC 4.41 04/09/2013 0835   HGB 13.7 04/09/2013 0835   HCT 41.6 04/09/2013 0835   PLT 66* 04/09/2013 0835   MCV 94.3 04/09/2013 0835   MCH 31.1 04/09/2013 0835   MCHC 32.9  04/09/2013 0835   RDW 13.4 04/09/2013 0835   LYMPHSABS 1.2 04/05/2013 1224   MONOABS 0.4 04/05/2013 1224   EOSABS 0.0 04/05/2013 1224   BASOSABS 0.0 04/05/2013 1224    BMET    Component Value Date/Time   NA 141 04/09/2013 0520   K 3.2* 04/09/2013 0520   CL 108 04/09/2013 0520   CO2 25 04/09/2013 0520   GLUCOSE 166* 04/09/2013 0520   BUN 15 04/09/2013 0520   CREATININE 0.67 04/09/2013 0520   CALCIUM 7.9* 04/09/2013 0520   GFRNONAA 84* 04/09/2013 0520   GFRAA >90 04/09/2013 0520    CMP     Component Value Date/Time  NA 141 04/09/2013 0520   K 3.2* 04/09/2013 0520   CL 108 04/09/2013 0520   CO2 25 04/09/2013 0520   GLUCOSE 166* 04/09/2013 0520   BUN 15 04/09/2013 0520   CREATININE 0.67 04/09/2013 0520   CALCIUM 7.9* 04/09/2013 0520   PROT 6.6 04/05/2013 1224   ALBUMIN 3.6 04/05/2013 1224   AST 27 04/05/2013 1224   ALT 18 04/05/2013 1224   ALKPHOS 92 04/05/2013 1224   BILITOT 0.7 04/05/2013 1224   GFRNONAA 84* 04/09/2013 0520   GFRAA >90 04/09/2013 0520      Time In Time Out Total Time Spent with Patient Total Overall Time  1130 1300 80 min 90 min    Greater than 50%  of this time was spent counseling and coordinating care related to the above assessment and plan.  Kellie Creed NP  Palliative Medicine Team Team Phone # [phone] Pager [phone]  Discussed with Dr Benjamine Mola

## 2013-04-10 NOTE — Progress Notes (Signed)
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  Pt now with septic shock due to UTI.  Pt from home with dtr and dtr possible considering palliative care per chart.    OT comments  Pt progressing and stood today during session. Attempted to perform grooming tasks sitting EOB (pt wiped mouth with washcloth). Pt remains significantly impaired due to prior CVA and decreased cognition. Feel pt will require max to total A for mobility/positioning. Unclear if family can provide this or if will need SNF.  Follow Up Recommendations  SNF;Supervision/Assistance - 24 hour    Barriers to Discharge       Equipment Recommendations  Other (comment) (TBD)    Recommendations for Other Services    Frequency Min 2X/week   Progress towards OT Goals Progress towards OT goals: Progressing toward goals  Plan Discharge plan remains appropriate    Precautions / Restrictions Precautions Precautions: Fall   Pertinent Vitals/Pain No signs of pain during session.     ADL  Grooming: Performed;Wash/dry face;Teeth care;+1 Total assistance Where Assessed - Grooming: Supported sitting Toilet Transfer: Chief of Staff: Patient Percentage: 40% Statistician Method: Sit to Barista: Other (comment) (from bed ) Transfers/Ambulation Related to ADLs: +2 total A (40%) for transfers. ADL Comments: Pt able to follow some commands. Pt used washcloth and wiped mouth, but required assistance to  wash rest of face and also to perform oral care. Tone noted in RUE- OT performed exercises to wrist and elbow and PT moved right shoulder a few times.     OT Diagnosis:    OT Problem List:   OT Treatment Interventions:     OT Goals(current goals can now be found in the care plan section) Acute Rehab OT Goals Patient Stated Goal: Pt unable to set OT Goal Formulation: Patient unable to participate in goal setting Time For Goal Achievement: 04/20/13 Potential to Achieve Goals: Good ADL Goals Additional ADL Goal #1: Caregivers will be independent in assisting pt with ADLs. Additional ADL Goal #2: Pt will perform bed mobility with max assist and caregiver independent in assisting. Additional Goal #3: Pt/caregiver will perform home exercise program for increased ROM; Independently; For increased strengthening.   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  Pt now with septic shock due to UTI.  Pt from home with dtr and dtr possible considering palliative care per chart.     Subjective Data      Prior Functioning       Cognition  Cognition Arousal/Alertness: Awake/alert Behavior During Therapy: Flat affect Overall Cognitive Status: No family/caregiver present to determine baseline cognitive functioning Area of Impairment: Orientation;Attention;Following commands;Problem solving Orientation Level: Person;Place;Time;Situation Current Attention Level: Sustained Following Commands: Follows one step commands inconsistently Problem Solving: Slow processing;Decreased initiation;Requires  verbal cues;Requires tactile  cues General Comments: pt followed some one step commands related to ADL tasks with OT and related to bed mobility    Mobility  Bed Mobility Bed Mobility: Rolling Right;Rolling Left;Left Sidelying to Sit;Sitting - Scoot to [place] of Bed;Sit to Sidelying Left;Scooting to [facility] Rolling Right: 2: Max assist Rolling Left: 3: Mod assist Left Sidelying to Sit: 1: +2 Total assist Left Sidelying to Sit: Patient Percentage: 30% Sitting - Scoot to Edge of Bed: 1: +2 Total assist Sitting - Scoot to Edge of Bed: Patient Percentage: 0% Sit to Sidelying Left: 1: +2 Total assist;HOB flat Sit to Sidelying Left: Patient Percentage: 10% Scooting to HOB: 1: +2 Total assist Scooting to [facility]: Patient Percentage: 0% Details for Bed Mobility Assistance: would initiate reaching and turning head when rolling to her Left Transfers Transfers: Sit to Stand;Stand to Sit Sit to Stand: 1: +2 Total assist;From bed;With upper extremity assist Sit to Stand: Patient Percentage: 40% Stand to Sit: 1: +2 Total assist;To bed Stand to Sit: Patient Percentage: 40% Details for Transfer Assistance: pt leaning posteriorly in standing; unable to get full hip extension (?limited dorsiflexion)    Exercises      Balance Balance Balance Assessed: Yes Static Sitting Balance Static Sitting - Balance Support: Feet supported Static Sitting - Level of Assistance: 4: Min assist;2: Max assist Static Sitting - Comment/# of Minutes: EOB ~12 minutes; when pt only focused on sitting balance, she could maintain with minguard assist for up to 20 seconds; when attempting OT tasks in sitting, she would lose focus and require up to max assist for balance Static Standing Balance Static Standing - Balance Support: Bilateral upper extremity supported Static Standing - Level of Assistance: 1: +2 Total assist Static Standing - Comment/# of Minutes: stood x1 minute   End of Session OT - End of Session Activity Tolerance: Other (comment) (limited by  cognition) Patient left: in bed;with call bell/phone within reach;Other (comment);with bed alarm set (palm protector on left hand)  GO     Earlie Raveling OTR/L [phone]  04/10/2013, 5:45 PM

## 2013-04-10 NOTE — Progress Notes (Signed)
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  Pt now with septic shock due to UTI.  Pt from home with dtr and dtr possible considering palliative care per chart.    PT Comments   Pt able to participate slightly more this date, however remains significantly impaired due to prior CVA and decr cognition. Feel pt will require max to total assist for mobility/positioning. Unclear if family can provide this or if will need SNF. Awaiting palliative care meeting results.   Follow Up Recommendations  Supervision/Assistance - 24 hour (depending on palliative care meeting)     Does the patient have the potential to tolerate intense rehabilitation     Barriers to Discharge        Equipment Recommendations  Other (comment) (TBD)    Recommendations for Other Services    Frequency Min 2X/week   Progress towards PT Goals Progress towards PT goals: Progressing toward goals  Plan Current plan remains appropriate    Precautions / Restrictions Precautions Precautions: Fall   Pertinent Vitals/Pain No signs of pain during session    Mobility  Bed Mobility Bed Mobility: Rolling Right;Rolling Left;Left Sidelying to Sit;Sitting - Scoot to [place] of Bed;Sit to Sidelying Left;Scooting to [facility] Rolling Right: 2: Max assist Rolling Left: 3: Mod assist Left Sidelying to Sit: 1: +2 Total assist Left Sidelying to Sit: Patient Percentage: 30% Sitting - Scoot to Edge of Bed: 1: +2 Total assist Sitting - Scoot to Edge of Bed: Patient  Percentage: 0% Sit to Sidelying Left: 1: +2 Total assist;HOB flat Sit to Sidelying Left: Patient Percentage: 10% Scooting to HOB: 1: +2 Total assist Scooting to [facility]: Patient Percentage: 0% Details for Bed Mobility Assistance: would initiate reaching and turning head when rolling to her Left Transfers Transfers: Sit to Stand;Stand to Sit Sit to Stand: 1: +2 Total assist;With upper extremity assist Sit to Stand: Patient Percentage: 40% Stand to Sit: 1: +2 Total assist Stand to Sit: Patient Percentage: 40% Details for Transfer Assistance: pt leaning posteriorly in standing; unable to get full hip extension (?limited dorsiflexion) Ambulation/Gait Ambulation/Gait Assistance: Not tested (comment)    Exercises     PT Diagnosis:    PT Problem List:   PT Treatment Interventions:     PT Goals (current goals can now be found in the care plan section) Acute Rehab PT Goals Patient Stated Goal: Pt unable to set  Visit Information  Last PT Received On: 04/10/13 Assistance Needed: +2 PT/OT Co-Evaluation/Treatment: Yes History of Present Illness: Pt is 72 y/o female with past medical history of dementia, right-sided CVA, 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  Pt now with septic shock due to UTI.  Pt from home with dtr and dtr possible considering palliative care per chart.     Subjective Data  Subjective: yea  (pt repeated whenever a question was asked) Patient Stated Goal: Pt unable to set   Cognition  Cognition Arousal/Alertness: Awake/alert Behavior During Therapy: Flat  affect Overall Cognitive Status: No family/caregiver present to determine baseline cognitive functioning Area of Impairment: Orientation;Attention;Following commands;Problem solving Orientation Level: Person;Place;Time;Situation Current Attention Level:  Sustained Following Commands: Follows one step commands inconsistently Problem Solving: Slow processing;Decreased initiation;Requires verbal cues;Requires tactile cues General Comments: pt followed some one step commands related to ADL tasks with OT and related to bed mobility    Balance  Balance Balance Assessed: Yes Static Sitting Balance Static Sitting - Balance Support: Feet supported Static Sitting - Level of Assistance: 4: Min assist;2: Max assist Static Sitting - Comment/# of Minutes: EOB ~12 minutes; when pt only focused on sitting balance, she could maintain with minguard assist for up to 20 seconds; when attempting OT tasks in sitting, she would lose focus and require up to max assist for balance Static Standing Balance Static Standing - Balance Support: Bilateral upper extremity supported Static Standing - Level of Assistance: 1: +2 Total assist Static Standing - Comment/# of Minutes: stood x 1 minute  End of Session PT - End of Session Activity Tolerance: Other (comment) (limited due to cognition) Patient left: in bed;with call bell/phone within reach;with bed alarm set Nurse Communication: Mobility status   GP     Laquonda Welby 04/10/2013, 4:32 PM Pager [phone]

## 2013-04-10 NOTE — Progress Notes (Signed)
Participated in goals of care with Palliative care    04/10/13 1130  Clinical Encounter Type  Visited With Patient and family together;Health care provider  Visit Type Other (Comment) (Goals of Care )  Referral From Palliative care team

## 2013-04-11 LAB — GLUCOSE, CAPILLARY
Glucose-Capillary: 157 mg/dL — ABNORMAL HIGH (ref 70–99)
Glucose-Capillary: 174 mg/dL — ABNORMAL HIGH (ref 70–99)
Glucose-Capillary: 201 mg/dL — ABNORMAL HIGH (ref 70–99)
Glucose-Capillary: 150 mg/dL — ABNORMAL HIGH (ref 70–99)

## 2013-04-11 LAB — CBC
HCT: 39.5 % (ref 36.0–46.0)
Hemoglobin: 13.1 g/dL (ref 12.0–15.0)
MCH: 30.9 pg (ref 26.0–34.0)
MCV: 93.2 fL (ref 78.0–100.0)
RBC: 4.24 MIL/uL (ref 3.87–5.11)

## 2013-04-11 LAB — CULTURE, BLOOD (ROUTINE X 2): Culture: NO GROWTH

## 2013-04-11 LAB — BASIC METABOLIC PANEL
CO2: 26 mEq/L (ref 19–32)
Calcium: 8.7 mg/dL (ref 8.4–10.5)
Creatinine, Ser: 0.53 mg/dL (ref 0.50–1.10)
Glucose, Bld: 164 mg/dL — ABNORMAL HIGH (ref 70–99)

## 2013-04-11 NOTE — Clinical Social Work Note (Signed)
Clinical Social Worker received referral for snf placement. CSW reviewed chart and noticed palliative consult. CSW will await outcome from palliative meeting in order to proceed with disposition.   Rozetta Nunnery MSW, [employer] [phone]

## 2013-04-11 NOTE — Progress Notes (Signed)
[facility] PROGRESS NOTE  Kellie King ZOX:[id number] DOB: 1940-09-22 DOA: 04/05/2013 PCP: Cala Bradford, MD  Assessment/Plan: Principal Problem:   Septic shock Active Problems:   Dementia   UTI (lower urinary tract infection)   Leukocytosis   High anion gap metabolic acidosis   Acute kidney injury   Normocytic anemia   Hyperglycemia   Hypernatremia   Palliative care encounter   Adult failure to thrive   Altered mental status    1. Septic shock/UTI/ Leukocytosis: Patient presented with altered mental status, tachycardia, wcc 19 with left shift, as well as a positive urinary sediment with pyuria and bacteriuria. The clinical picture was consistent with sepsis, due to UTI. Blood cultures were sent off, she was commenced on iv Rocephin as well as supportive treatment. Blood cultures have remained negative so far. Urine culture grew E. Coli, sensitive to Rocephin, but resistant to the quinolones. Patient has remained afebrile, improved clinically, and as of 04/07/13, wcc had normalized. Rocephin was discontinued on 04/09/13, without deleterious effect.  2. Acute kidney injury: Creatine was 2.05 on admission, against a known baseline creatinine of 0.59 on 03/31/13. This was ARF, secondary to dehydration, volume depletion and sepsis. Nephrotoxins were avoided, and patient successfully managed with iv fluids. As of 04/08/13, creatinine had normalized at 0.65. ARF has resolved.  3. Hypernatremia: This was due to severe dehydration and volume depletion. Serum sodium was 164 on admission, no doubt contributory to AMS. Following hypotonic fluid administration, Sodium has normalized, and is 140 today.  4. Hyperglycemia: Managing with SSI, and CBGs are reasonable.  Alzheimer's Dementia/AMS: As described above, patient presented with altered mental status, described as confusion. She was also, not communicative, at the time of admission. No doubt, this was due to her acute infective and profound  metabolic derangements, which have now resolved. Clinically, she is now alert, although still not communicatve. Suspect close to baseline. Risperdal was reduced,due to AM lethargy.  5. Dysphagia: Evaluated by SLP, and recommended D1/Thin liquid.    Code Status: DNR/DNI.  Family Communication:  Disposition Plan: To be determined. Patient has reported continued decline over the past two years secondary to dementia. Minimal ambulation, incontinence, worsening memory. Kellie Creed NP, provided palliative care consultation, and had initial discussion with patient's daughter [name] # [phone]. DNR/DNI status confirmed. Full GOC meeting planned for 04/12/13.      Brief narrative: 72 y.o. 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 She was brought here by EMS. In the ED, she was initially tachycardic, with stable blood pressure, was given a liter of normal saline and her heart rate decreased. Basic metabolic showed a sodium of 166 a chloride of 128 and a creatinine of 2.6 (on 04/01/2011 her creatinine was 0.5) lactic acid was done which is 3.0 CBC was done, white count of 19 with a left shift. Urinalysis showed too many to count white blood cells and many bacteria. Admitted for further management.    Consultants:  Kellie Creed, NP, PMT.   Procedures:  N/A.   Antibiotics:  Rocephin 04/05/13-04/09/13  HPI/Subjective: No new issues.   Objective: Vital signs in last 24 hours: Temp:  [98.3 F (36.8 C)-99.9 F (37.7 C)] 98.3 F (36.8 C) (09/10 1400) Pulse Rate:  [92-106] 106 (09/10 1400) Resp:  [  18] 18 (09/10 1400) BP: (130-161)/(54-77) 130/54 mmHg (09/10 1400) SpO2:  [98  %-100 %] 100 % (09/10 1400) Weight:  [52.6 kg (115 lb 15.4 oz)] 52.6 kg (115 lb 15.4 oz) (09/10 0500) Weight change: 0.6 kg (1 lb 5.2 oz) Last BM Date: 04/11/13  Intake/Output from previous day: 09/09 0701 - 09/10 0700 In: 365 [P.O.:60; I.V.:305] Out: 1575 [Urine:1575] Total I/O In: 400 [P.O.:240; I.V.:160] Out: -    Physical Exam: General: Comfortable, alert, non-communicative, not short of breath at rest.  HEENT:  No clinical pallor, no jaundice, no conjunctival injection or discharge. Hydration is fair.  NECK:  Supple, JVP not seen, no carotid bruits, no palpable lymphadenopathy, no palpable goiter. CHEST:  Clinically clear to auscultation, no wheezes, no crackles. HEART:  Sounds 1 and 2 heard, normal, regular, no murmurs. ABDOMEN:  Full, soft, no scars, non-tender, no palpable organomegaly, no palpable masses, normal bowel sounds. GENITALIA:  Normal external genitalia. LOWER EXTREMITIES:  No pitting edema, palpable peripheral pulses. MUSCULOSKELETAL SYSTEM:  Generalized osteoarthritic changes. Has left UE/LE contractures. . CENTRAL NERVOUS SYSTEM:  Has dense left hemiplegia. .  Lab Results:  Recent Labs  04/09/13 0835 04/11/13 0555  WBC 6.0 6.4  HGB 13.7 13.1  HCT 41.6 39.5  PLT 66* 104*    Recent Labs  04/10/13 1300 04/11/13 0555  NA 141 140  K 3.5 3.5  CL 108 105  CO2 25 26  GLUCOSE 158* 164*  BUN 11 10  CREATININE 0.50 0.53  CALCIUM 8.3* 8.7   Recent Results (from the past 240 hour(s))  URINE CULTURE     Status: None   Collection Time    04/05/13  1:52 PM      Result Value Range Status   Specimen Description URINE, RANDOM   Final   Special Requests NONE   Final   Culture  Setup Time     Final   Value: 04/06/2013 01:14     Performed at [facility] Count     Final   Value: >=100,000 COLONIES/ML     Performed at [facility]   Culture     Final   Value: ESCHERICHIA COLI     Performed at [facility]   Report  Status 04/07/2013 FINAL   Final   Organism ID, Bacteria ESCHERICHIA COLI   Final  CULTURE, BLOOD (ROUTINE X 2)     Status: None   Collection Time    04/05/13  3:23 PM      Result Value Range Status   Specimen Description BLOOD HAND RIGHT   Final   Special Requests BOTTLES DRAWN AEROBIC ONLY .[facility]   Final   Culture  Setup Time     Final   Value: 04/05/2013 22:09     Performed at [facility]   Culture     Final   Value: NO GROWTH 5 DAYS     Performed at [facility]   Report Status 04/11/2013 FINAL   Final  CULTURE, BLOOD (ROUTINE X 2)     Status: None   Collection Time    04/05/13  3:45 PM      Result Value Range Status   Specimen Description BLOOD RIGHT HAND   Final   Special Requests BOTTLES DRAWN AEROBIC ONLY 1CC   Final   Culture  Setup Time     Final   Value: 04/05/2013 22:10     Performed at [facility]  Final   Value: NO GROWTH 5 DAYS     Performed at [facility]   Report Status 04/11/2013 FINAL   Final  MRSA PCR SCREENING     Status: None   Collection Time    04/05/13  6:53 PM      Result Value Range Status   MRSA by PCR NEGATIVE  NEGATIVE Final   Comment:            The GeneXpert MRSA Assay (FDA     approved for NASAL specimens     only), is one component of a     comprehensive MRSA colonization     surveillance program. It is not     intended to diagnose MRSA     infection nor to guide or     monitor treatment for     MRSA infections.  URINE CULTURE     Status: None   Collection Time    04/06/13  8:49 AM      Result Value Range Status   Specimen Description URINE, CATHETERIZED   Final   Special Requests NONE   Final   Culture  Setup Time     Final   Value: 04/06/2013 09:16     Performed at [facility]   [place] Count     Final   Value: NO GROWTH     Performed at [facility]   Culture     Final   Value: NO GROWTH     Performed at [facility]   Report Status 04/07/2013 FINAL    Final     Studies/Results: No results found.  Medications: Scheduled Meds: . amLODipine  5 mg Oral Daily  . aspirin  81 mg Oral Daily  . feeding supplement  237 mL Oral BID BM  . insulin aspart  0-5 Units Subcutaneous QHS  . insulin aspart  0-9 Units Subcutaneous TID WC  . levothyroxine  50 mcg Oral QAC breakfast  . lisinopril  5 mg Oral Daily  . risperiDONE  0.5 mg Oral QHS   Continuous Infusions: . dextrose 5 % and 0.45% NaCl 20 mL/hr at 04/10/13 2130   PRN Meds:.hydrALAZINE, polyethylene glycol    LOS: 6 days   Kalli Greenfield,CHRISTOPHER  [facility] Pager [phone]. If 8PM-8AM, please contact night-coverage at [email or web address], password [facility] 04/11/2013, 4:28 PM  LOS: 6 days

## 2013-04-11 NOTE — Progress Notes (Signed)
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  Patient sleeping upon RD visit; did not wake.  PO intake variable at 0-75% per flowsheet records.  Per medication history, patient has been refusing her Ensure supplements. Dysphagia Treatment Note reviewed 9/8.  Palliative Care Team note reviewed 9/9 ---> tentative meeting scheduled for 9/11.  RD suspects some level of malnutrition, however, unable to identify at this time.  Height: Ht Readings from Last 1 Encounters:  04/05/13 5\' 4"  (1.626 m)    Weight Status:   Wt Readings from Last 1 Encounters:  04/11/13 115 lb 15.4 oz (52.6 kg)    Re-estimated needs:  Kcal: 1200-1400 Protein: 50-60 gm Fluid: >/= 1.5 L  Skin: Intact  Diet Order: Dysphagia 1, thin liquids   Intake/Output Summary (Last 24 hours) at 04/11/13 1530 Last data filed at 04/11/13 1300  Gross per 24 hour  Intake    455 ml  Output   1400 ml  Net   -945 ml    Labs:   Recent Labs Lab 04/05/13 1900  04/09/13 0520 04/10/13 1300 04/11/13 0555  NA 164*  < > 141 141 140  K 4.5  < > 3.2* 3.5 3.5  CL 128*  < > 108 108 105  CO2 22  < > 25 25 26   BUN 120*  < > 15 11 10   CREATININE 2.05*  < > 0.67 0.50 0.53  CALCIUM 8.0*  < > 7.9* 8.3* 8.7  MG 3.1*  --   --   --   --   GLUCOSE 190*  < > 166* 158* 164*  < > = values in this  interval not displayed.  CBG (last 3)   Recent Labs  04/10/13 2200 04/11/13 0735 04/11/13 1135  GLUCAP 176* 157* 174*    Scheduled Meds: . amLODipine  5 mg Oral Daily  . aspirin  81 mg Oral Daily  . feeding supplement  237 mL Oral BID BM  . insulin aspart  0-5 Units Subcutaneous QHS  . insulin aspart  0-9 Units Subcutaneous TID WC  . levothyroxine  50 mcg Oral QAC breakfast  . lisinopril  5 mg Oral Daily  . risperiDONE  0.5 mg Oral QHS    Continuous Infusions: . dextrose 5 % and 0.45% NaCl 20 mL/hr at 04/10/13 2130    Maureen Chatters, RD, LDN Pager #: [phone] After-Hours Pager #: [phone]

## 2013-04-12 LAB — BASIC METABOLIC PANEL
CO2: 27 mEq/L (ref 19–32)
Calcium: 9 mg/dL (ref 8.4–10.5)
Glucose, Bld: 164 mg/dL — ABNORMAL HIGH (ref 70–99)
Potassium: 4.3 mEq/L (ref 3.5–5.1)
Sodium: 139 mEq/L (ref 135–145)

## 2013-04-12 LAB — CBC
Hemoglobin: 13.6 g/dL (ref 12.0–15.0)
MCH: 30.8 pg (ref 26.0–34.0)
Platelets: 155 10*3/uL (ref 150–400)
RBC: 4.42 MIL/uL (ref 3.87–5.11)
WBC: 5.4 10*3/uL (ref 4.0–10.5)

## 2013-04-12 LAB — GLUCOSE, CAPILLARY
Glucose-Capillary: 160 mg/dL — ABNORMAL HIGH (ref 70–99)
Glucose-Capillary: 154 mg/dL — ABNORMAL HIGH (ref 70–99)
Glucose-Capillary: 130 mg/dL — ABNORMAL HIGH (ref 70–99)

## 2013-04-12 NOTE — Clinical Social Work Note (Signed)
Clinical Social Work Department BRIEF PSYCHOSOCIAL ASSESSMENT 04/12/2013  Patient:  Kellie King, Kellie King     Account Number:  [id number]     Admit date:  04/05/2013  Clinical Social Worker:  Hulan Fray  Date/Time:  04/12/2013 09:46 AM  Referred by:  Physician  Date Referred:  04/09/2013 Referred for  SNF Placement   Other Referral:   Interview type:  Other - See comment Other interview type:   [name]- NP with palliative  Chart review    PSYCHOSOCIAL DATA Living Status:  FAMILY Admitted from facility:   Level of care:   Primary support name:  [name] Primary support relationship to patient:  CHILD, ADULT Degree of support available:   supportive    CURRENT CONCERNS Current Concerns  Post-Acute Placement   Other Concerns:    SOCIAL WORK ASSESSMENT / PLAN Clinical Social Worker received referral for patient needing SNF placement at discharge. CSW reviewed chart and spoke with NP with Palliative Medicine Team. CSW was informed that daughter is hopeful for SNF placement for short term with palliative following. CSW called and left a voice message with daughter to return call. CSW will leave SNF packet in patient's room for daughter to review as well.    CSW will complete FL2 for MD's signature and will await return call from daughter, in order to proceed with SNF search.   Assessment/plan status:  Psychosocial Support/Ongoing Assessment of Needs Other assessment/ plan:   Information/referral to community resources:   SNF packet will be left in room    PATIENT'S/FAMILY'S RESPONSE TO PLAN OF CARE: CSW is awaiting return call from daughter to discuss short term SNF placement with palliative following.

## 2013-04-12 NOTE — Progress Notes (Signed)
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  Pt now with septic shock due to UTI.  Pt from home with dtr and dtr possible considering palliative care per chart.    PT Comments   Pt continues to be limited due to cognition and unable to follow commands this session.  Pt continues to look with blank stare.  At end of session pt able to nod head "yes".  Awaiting palliative care meeting to determine overall goals of care and further need for PT.  At this time pt unable to participate during PT session however need to determine if family education needs to be provided.   Follow Up Recommendations  Supervision/Assistance - 24 hour;SNF (depending on palliative care meeting)     Barriers to Discharge  Family difficult to get hold of and determine overall disposition plans      Frequency Min 2X/week   Progress towards PT Goals Progress towards PT goals: Not progressing toward goals - comment (Awaiting from family on d/c plans if further education )  Plan Current plan remains appropriate    Precautions / Restrictions Precautions Precautions: Fall Restrictions Weight Bearing Restrictions: No   Pertinent Vitals/Pain Pt unable to rate    Mobility  Bed Mobility Bed Mobility: Rolling Right;Rolling Left;Left Sidelying to Sit;Sitting - Scoot to [place] of Bed;Sit to Sidelying Left;Scooting to HOB Supine to Sit: 1: +2 Total assist Supine to Sit: Patient Percentage: 0% Sitting - Scoot to Edge of Bed: 1: +2 Total assist Sitting -  Scoot to Edge of Bed: Patient Percentage: 0% Details for Bed Mobility Assistance: Pt needed (A) for all mobility and unable to follow commands Transfers Transfers: Sit to Stand;Stand to [employer] Pivot Transfers: 1: +2 Total assist;From elevated surface Squat Pivot Transfers: Patient Percentage: 20% Details for Transfer Assistance: Pt unable to stand completly upright therefore did squat pivot to chair.  However when assisting pt into recliner pt's trunk went into extension. 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  Pt now with septic shock due to UTI.  Pt from home with dtr and dtr possible considering palliative care per chart.     Subjective Data  Subjective: Pt with head knod "Yes"  Patient Stated Goal: Pt unable to set   Cognition  Cognition Arousal/Alertness: Awake/alert Behavior During Therapy: Flat affect Overall Cognitive Status: No family/caregiver present  to determine baseline cognitive functioning Area of Impairment: Orientation;Attention;Following commands;Problem solving Orientation Level: Person;Place;Time;Situation Current Attention Level: Sustained Following Commands:  (Did not follow commands) Problem Solving: Slow  processing;Decreased initiation;Requires verbal cues;Requires tactile cues General Comments: Pt did not follow commands and continued to have a blank stare.  Only until the end of session did pt knod "Yes"    Balance  Balance Balance Assessed: Yes Static Sitting Balance Static Sitting - Balance Support: Feet supported Static Sitting - Level of Assistance: 2: Max assist Static Sitting - Comment/# of Minutes: ~1 minute with pt leaning posterior and to the right side Static Standing Balance Static Standing - Balance Support: Bilateral upper extremity supported Static Standing - Level of Assistance: 1: +2 Total assist  End of Session PT - End of Session Equipment Utilized During Treatment: Gait belt Activity Tolerance: Other (comment) (Limited due to cognition) Patient left: in chair;with call bell/phone within reach Nurse Communication: Mobility status   GP     Kellie King 04/12/2013, 5:53 PM  Jake Shark, PT DPT [phone]

## 2013-04-12 NOTE — Progress Notes (Signed)
Progress Note from the Palliative Medicine Team at [facility]  Subjective: patient is arousable with vigorous stimulation, appears comfortable   -call placed to daughter Cecil Cranker in [place] # [phone] -spoke with daughter [name] [facility]) [phone]  Objective: Allergies  Allergen Reactions  . Atenolol     Bradycardia   . Other     Patient is allergic to a medicine for alzheimer  But does not know name  . Penicillins     ?   Scheduled Meds: . amLODipine  5 mg Oral Daily  . aspirin  81 mg Oral Daily  . feeding supplement  237 mL Oral BID BM  . insulin aspart  0-5 Units Subcutaneous QHS  . insulin aspart  0-9 Units Subcutaneous TID WC  . levothyroxine  50 mcg Oral QAC breakfast  . lisinopril  5 mg Oral Daily  . risperiDONE  0.5 mg Oral QHS   Continuous Infusions: . dextrose 5 % and 0.45% NaCl 20 mL/hr at 04/10/13 2130   PRN Meds:.hydrALAZINE, polyethylene glycol  BP 126/54  Pulse 88  Temp(Src) 98.8 F (37.1 C) (Oral)  Resp 16  Ht 5\' 4"  (1.626 m)  Wt 52.6 kg (115 lb 15.4 oz)  BMI 19.9 kg/m2  SpO2 98%   PPS:30 % at best    Intake/Output Summary (Last 24 hours) at 04/12/13 0818 Last data filed at 04/12/13 0600  Gross per 24 hour  Intake    571 ml  Output    950 ml  Net   -379 ml      Physical Exam: General: chronically  ill appearing, minimally responsive, unable to follow commands  HEENT: Mm, no exudate, poor dentation  Chest: Diminished in bases, CTA  CVS: RRR  Abdomen:soft NT +BS  Ext: LUE contracted with muscle atrophy  Neuro: minimally responsive, non verbal presently, unable to follow commands   Labs: CBC    Component Value Date/Time   WBC 5.4 04/12/2013 0605   RBC 4.42 04/12/2013 0605   HGB 13.6 04/12/2013 0605   HCT 41.8 04/12/2013 0605   PLT 155 04/12/2013 0605   MCV 94.6 04/12/2013 0605   MCH 30.8 04/12/2013 0605   MCHC 32.5 04/12/2013 0605   RDW 13.5 04/12/2013 0605   LYMPHSABS 1.2 04/05/2013 1224   MONOABS 0.4 04/05/2013 1224   EOSABS 0.0 04/05/2013 1224   BASOSABS 0.0 04/05/2013 1224    BMET    Component Value Date/Time   NA 139 04/12/2013 0605   K 4.3 04/12/2013 0605   CL 103 04/12/2013 0605   CO2 27 04/12/2013 0605   GLUCOSE 164* 04/12/2013 0605   BUN 12 04/12/2013 0605   CREATININE 0.51 04/12/2013 0605   CALCIUM 9.0 04/12/2013 0605   GFRNONAA >90 04/12/2013 0605   GFRAA >90 04/12/2013 0605    CMP     Component Value Date/Time   NA 139 04/12/2013 0605   K 4.3 04/12/2013 0605   CL 103 04/12/2013 0605   CO2 27 04/12/2013 0605   GLUCOSE 164* 04/12/2013 0605   BUN 12 04/12/2013 0605   CREATININE 0.51 04/12/2013 0605   CALCIUM 9.0 04/12/2013 0605   PROT 6.6 04/05/2013 1224   ALBUMIN 3.6 04/05/2013 1224   AST 27 04/05/2013 1224   ALT 18 04/05/2013 1224   ALKPHOS 92 04/05/2013 1224   BILITOT 0.7 04/05/2013 1224   GFRNONAA >90 04/12/2013 0605   GFRAA >90 04/12/2013 0605       Assessment and Plan: 1. Code Status:DNR/DNI 2. Symptom Control:  Failure to thrive/weakness: Patient's advanced directives are documented to utilize artificial                             feeding/hydration  3. Psycho/Social:  Emotional support offered to family.  Daughters have financial concerns related to long term placement at SNF.  4. Disposition: Family is hopeful for dc to SNF for rehabilitation if eligible with Palliative care services to follow.  Ultimately they would like for her to return home.  They do not want [facility] considered  Patient Documents Completed or Given: Document Given Completed  Advanced Directives Pkt    MOST    DNR    Gone from [facility]    Hard Choices yes     Time In Time Out Total Time Spent with Patient Total Overall Time  0800 0835 35 min 35 min    Greater than 50%  of this time was spent counseling and coordinating care related to the above assessment and plan.  [name] NP  Palliative Medicine Team Team Phone # [phone] Pager [phone]   1

## 2013-04-12 NOTE — ED Provider Notes (Signed)
Medical screening examination/treatment/procedure(s) were conducted as a shared visit with non-physician practitioner(s) and myself.  I personally evaluated the patient during the encounter  From ECF With AMS> unable to give history. Dry mucus membranes, tachycardic with borderline blood pressures.  Suspect sepsis: LAbs, cultures, IVF, antibiotics.  Glynn Octave, MD 04/12/13 [phone]

## 2013-04-12 NOTE — Progress Notes (Signed)
[facility] PROGRESS NOTE  Kellie King:[id number] DOB: 1940-11-04 DOA: 04/05/2013 PCP: Cala Bradford, MD  Assessment/Plan: Principal Problem:   Septic shock Active Problems:   Dementia   UTI (lower urinary tract infection)   Leukocytosis   High anion gap metabolic acidosis   Acute kidney injury   Normocytic anemia   Hyperglycemia   Hypernatremia   Palliative care encounter   Adult failure to thrive   Altered mental status    1. Septic shock/UTI/ Leukocytosis: Patient presented with altered mental status, tachycardia, wcc 19 with left shift, as well as a positive urinary sediment with pyuria and bacteriuria. The clinical picture was consistent with sepsis, due to UTI. Blood cultures were sent off, she was commenced on iv Rocephin as well as supportive treatment. Blood cultures have remained negative so far. Urine culture grew E. Coli, sensitive to Rocephin, but resistant to the quinolones. Patient has remained afebrile, improved clinically, and as of 04/07/13, wcc had normalized. Rocephin was discontinued on 04/09/13, without deleterious effect. Sepsis has resolved.  2. Acute kidney injury: Creatine was 2.05 on admission, against a known baseline creatinine of 0.59 on 03/31/13. This was ARF, secondary to dehydration, volume depletion and sepsis. Nephrotoxins were avoided, and patient successfully managed with iv fluids. As of 04/08/13, creatinine had normalized at 0.65. ARF has resolved.  3. Hypernatremia: This was due to severe dehydration and volume depletion. Serum sodium was 164 on admission, no doubt contributory to AMS. Following hypotonic fluid administration, Sodium has normalized, and is 140 today.  4. Hyperglycemia: Managing with SSI, and CBGs are reasonable.  Alzheimer's Dementia/AMS: As described above, patient presented with altered mental status, described as confusion. She was also, not communicative, at the time of admission. No doubt, this was due to her acute  infective and profound metabolic derangements, which have now resolved. Clinically, she is now alert, although still not communicatve. Suspect close to baseline. Risperdal was reduced, due to AM lethargy.  5. Dysphagia: Evaluated by SLP, and recommended D1/Thin liquid.    Code Status: DNR/DNI.  Family Communication:  Disposition Plan: To be determined. Patient has reported continued decline over the past two years secondary to dementia. Minimal ambulation, incontinence, worsening memory. [name] NP, provided palliative care consultation, and had initial discussion with patient's daughter [name] # [phone]. DNR/DNI status confirmed. Per GOC meeting of 04/12/13. Patient will be discharged to SNF with Palliative care services.      Brief narrative: 72 y.o. 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 She was brought here by EMS. In the ED, she was initially tachycardic, with stable blood pressure, was given a liter of normal saline and her heart rate decreased. Basic metabolic showed a sodium of 166 a chloride of 128 and a creatinine of 2.6 (on 04/01/2011 her creatinine was 0.5) lactic acid was done which is 3.0 CBC was done, white count of 19 with a left shift. Urinalysis showed too many to count white blood cells and many bacteria. Admitted for further management.    Consultants:  [name], NP, PMT.   Procedures:  N/A.   Antibiotics:  Rocephin 04/05/13-04/09/13  HPI/Subjective: No new issues.   Objective: Vital signs in last 24 hours: Temp:  [97.9 F (36.6 C)-98.8 F (37.1 C)] 98.8 F (  37.1 C) (09/11 0549) Pulse Rate:  [88-106] 88 (09/11 0549) Resp:   [16-18] 16 (09/11 0549) BP: (126-155)/(54-64) 126/54 mmHg (09/11 0549) SpO2:  [98 %-100 %] 98 % (09/11 0549) Weight change:  Last BM Date: 04/11/13  Intake/Output from previous day: 09/10 0701 - 09/11 0700 In: 571 [P.O.:265; I.V.:306] Out: 950 [Urine:950]     Physical Exam: General: Comfortable, alert, non-communicative, not short of breath at rest.  HEENT:  No clinical pallor, no jaundice, no conjunctival injection or discharge. Hydration is satisfactory.  NECK:  Supple, JVP not seen, no carotid bruits, no palpable lymphadenopathy, no palpable goiter. CHEST:  Clinically clear to auscultation, no wheezes, no crackles. HEART:  Sounds 1 and 2 heard, normal, regular, no murmurs. ABDOMEN:  Full, soft, non-tender, no palpable organomegaly, no palpable masses, normal bowel sounds. GENITALIA:  Not examnined. LOWER EXTREMITIES:  No pitting edema, palpable peripheral pulses. MUSCULOSKELETAL SYSTEM:  Generalized osteoarthritic changes. Has left UE/LE contractures. . CENTRAL NERVOUS SYSTEM:  Has dense left hemiplegia. .  Lab Results:  Recent Labs  04/11/13 0555 04/12/13 0605  WBC 6.4 5.4  HGB 13.1 13.6  HCT 39.5 41.8  PLT 104* 155    Recent Labs  04/11/13 0555 04/12/13 0605  NA 140 139  K 3.5 4.3  CL 105 103  CO2 26 27  GLUCOSE 164* 164*  BUN 10 12  CREATININE 0.53 0.51  CALCIUM 8.7 9.0   Recent Results (from the past 240 hour(s))  URINE CULTURE     Status: None   Collection Time    04/05/13  1:52 PM      Result Value Range Status   Specimen Description URINE, RANDOM   Final   Special Requests NONE   Final   Culture  Setup Time     Final   Value: 04/06/2013 01:14     Performed at [facility] Count     Final   Value: >=100,000 COLONIES/ML     Performed at [facility]   Culture     Final   Value: ESCHERICHIA COLI     Performed at [facility]   Report Status 04/07/2013 FINAL   Final   Organism ID, Bacteria ESCHERICHIA COLI    Final  CULTURE, BLOOD (ROUTINE X 2)     Status: None   Collection Time    04/05/13  3:23 PM      Result Value Range Status   Specimen Description BLOOD HAND RIGHT   Final   Special Requests BOTTLES DRAWN AEROBIC ONLY .5CC   Final   Culture  Setup Time     Final   Value: 04/05/2013 22:09     Performed at [facility]   Culture     Final   Value: NO GROWTH 5 DAYS     Performed at [facility]   Report Status 04/11/2013 FINAL   Final  CULTURE, BLOOD (ROUTINE X 2)     Status: None   Collection Time    04/05/13  3:45 PM      Result Value Range Status   Specimen Description BLOOD RIGHT HAND   Final   Special Requests BOTTLES DRAWN AEROBIC ONLY 1CC   Final   Culture  Setup Time     Final   Value: 04/05/2013 22:10     Performed at [facility]   Culture     Final   Value: NO GROWTH 5 DAYS     Performed at [facility]   Report Status 04/11/2013 FINAL   Final  MRSA PCR SCREENING     Status: None   Collection Time    04/05/13  6:53 PM      Result Value Range Status   MRSA by PCR NEGATIVE  NEGATIVE Final   Comment:            The GeneXpert MRSA Assay (FDA     approved for NASAL specimens     only), is one component of a     comprehensive MRSA colonization     surveillance program. It is not     intended to diagnose MRSA     infection nor to guide or     monitor treatment for     MRSA infections.  URINE CULTURE     Status: None   Collection Time    04/06/13  8:49 AM      Result Value Range Status   Specimen Description URINE, CATHETERIZED   Final   Special Requests NONE   Final   Culture  Setup Time     Final   Value: 04/06/2013 09:16     Performed at [facility]   [facility] Count     Final   Value: NO GROWTH     Performed at [facility]   Culture     Final   Value: NO GROWTH     Performed at [facility]   Report Status 04/07/2013 FINAL   Final     Studies/Results: No results found.  Medications: Scheduled  Meds: . amLODipine  5 mg Oral Daily  . aspirin  81 mg Oral Daily  . feeding supplement  237 mL Oral BID BM  . insulin aspart  0-5 Units Subcutaneous QHS  . insulin aspart  0-9 Units Subcutaneous TID WC  . levothyroxine  50 mcg Oral QAC breakfast  . lisinopril  5 mg Oral Daily  . risperiDONE  0.5 mg Oral QHS   Continuous Infusions: . dextrose 5 % and 0.45% NaCl 20 mL/hr at 04/10/13 2130   PRN Meds:.hydrALAZINE, polyethylene glycol    LOS: 7 days   Zaidan Keeble,CHRISTOPHER  [facility] Pager [phone]. If 8PM-8AM, please contact night-coverage at [email or web address], password [facility] 04/12/2013, 12:40 PM  LOS: 7 days

## 2013-04-13 LAB — CBC
HCT: 40.3 % (ref 36.0–46.0)
MCHC: 33.5 g/dL (ref 30.0–36.0)
MCV: 93.5 fL (ref 78.0–100.0)
Platelets: 171 10*3/uL (ref 150–400)
RDW: 13.5 % (ref 11.5–15.5)

## 2013-04-13 LAB — BASIC METABOLIC PANEL
BUN: 19 mg/dL (ref 6–23)
Creatinine, Ser: 0.56 mg/dL (ref 0.50–1.10)
GFR calc Af Amer: >90 mL/min (ref >90)
GFR calc non Af Amer: 90 mL/min — ABNORMAL LOW (ref >90)
Potassium: 4.2 mEq/L (ref 3.5–5.1)

## 2013-04-13 NOTE — Clinical Social Work Note (Signed)
10:20am CSW attempted again to reach daughter [name] at number provided in chart and left another voice message to return call.   Rozetta Nunnery MSW, [employer] [phone]

## 2013-04-13 NOTE — Discharge Summary (Signed)
Physician Discharge Summary  LAFREDA CASEBEER ZOX:[id number] DOB: 1940/09/01 DOA: 04/05/2013  PCP: Cala Bradford, MD  Admit date: 04/05/2013 Discharge date: 04/17/2013  Time spent: 40 minutes  Recommendations for Outpatient Follow-up:  1. Follow up with primary MD.  2. SNF with Palliative Care.   Discharge Diagnoses:  Principal Problem:   Septic shock Active Problems:   Dementia   UTI (lower urinary tract infection)   Leukocytosis   High anion gap metabolic acidosis   Acute kidney injury   Normocytic anemia   Hyperglycemia   Hypernatremia   Palliative care encounter   Adult failure to thrive   Altered mental status   Discharge Condition: Satisfactory.  Diet recommendation: Dysphagia-1/Thin liquids.   Filed Weights   04/12/13 1342 04/14/13 0541 04/15/13 0600  Weight: 52.6 kg (115 lb 15.4 oz) 50 kg (110 lb 3.7 oz) 48.6 kg (107 lb 2.3 oz)    History of present illness:  72 y.o. 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 She was brought here by EMS. In the ED, she was initially tachycardic, with stable blood pressure, was given a liter of normal saline and her heart rate decreased. Basic metabolic showed a sodium of 166 a chloride of 128 and a creatinine of 2.6 (on 04/01/2011 her creatinine was 0.5) lactic acid was done which is 3.0 CBC was done, white count of 19 with a left shift. Urinalysis showed too many to count white blood cells and many bacteria. Admitted for further management.      Hospital Course:  1. Septic shock/UTI/ Leukocytosis: Patient presented with altered mental status, tachycardia, wcc 19 with left shift, as well as a positive urinary  sediment with pyuria and bacteriuria. The clinical picture was consistent with sepsis, due to UTI. Blood cultures were sent off, she was commenced on iv Rocephin as well as supportive treatment. Blood cultures have remained negative. Urine culture grew E. Coli, sensitive to Rocephin, but resistant to the quinolones. Patient has remained afebrile, improved clinically, and as of 04/07/13, wcc had normalized. Rocephin was discontinued on 04/09/13, without deleterious effect. Sepsis has resolved.  2. Acute kidney injury: Creatine was 2.05 on admission, against a known baseline creatinine of 0.59 on 03/31/13. This was ARF, secondary to dehydration, volume depletion and sepsis. Nephrotoxins were avoided, and patient successfully managed with iv fluids. As of 04/08/13, creatinine had normalized at 0.65. ARF has resolved.  3. Hypernatremia: This was due to severe dehydration and volume depletion. Serum sodium was 164 on admission, no doubt contributory to AMS. Following hypotonic fluid administration, Sodium has normalized, and as of 04/13/13, was 139.  4. Hyperglycemia: Managed with SSI, and CBGs remained reasonable.  Alzheimer's Dementia/AMS: As described above, patient presented with altered mental status, described as confusion. She was also, not communicative at the time of admission. No doubt, this was due to her acute infective and profound metabolic derangements, which have now resolved. Clinically, she is now alert, although still not communicatve. Suspect close to baseline. Risperdal was reduced, due to AM lethargy.  5. Dysphagia: Evaluated by SLP, and recommended D1/Thin liquid.   Comment: [name] NP, provided Palliative care Medicine consultation, discussed in detail with daughter [name] [facility]) [phone], and the following, were established:   1. Code Status: DNR/DNI. 2. Symptom  Control. 3. Failure to thrive/weakness: Patient's advanced directives are documented to utilize artificial  feeding/hydration. 4. Disposition: SNF for rehabilitation, with Palliative care services to follow. Ultimately they would like for her to return home.     Procedures:  See Below  Consultations: [name], NP, Palliative Care Medicine. Marland Kitchen    Discharge Exam: Filed Vitals:   04/17/13 0614  BP: 116/48  Pulse: 90  Temp: 97.6 F (36.4 C)  Resp: 18    General: Comfortable, alert, non-communicative, not short of breath at rest.  HEENT: No clinical pallor, no jaundice, no conjunctival injection or discharge. Hydration is satisfactory.  NECK: Supple, JVP not seen, no carotid bruits, no palpable lymphadenopathy, no palpable goiter.  CHEST: Clinically clear to auscultation, no wheezes, no crackles.  HEART: Sounds 1 and 2 heard, normal, regular, no murmurs.  ABDOMEN: Full, soft, non-tender, no palpable organomegaly, no palpable masses, normal bowel sounds.  GENITALIA: Not examnined.  LOWER EXTREMITIES: No pitting edema, palpable peripheral pulses.  MUSCULOSKELETAL SYSTEM: Generalized osteoarthritic changes. Has left UE/LE contractures. .  CENTRAL NERVOUS SYSTEM: Has dense left hemiplegia.   Discharge Instructions      Discharge Orders   Future Orders Complete By Expires   Diet - low sodium heart healthy  As directed    Increase activity slowly  As directed        Medication List    STOP taking these medications       mirtazapine 15 MG tablet  Commonly known as:  REMERON     venlafaxine XR 75 MG 24 hr capsule  Commonly known as:  EFFEXOR-XR      TAKE these medications       amLODipine 5 MG tablet  Commonly known as:  NORVASC  Take 1 tablet (5 mg total) by mouth daily.     aspirin 81 MG chewable tablet  Chew 81 mg by mouth daily.     buPROPion 100 MG 12 hr tablet  Commonly known as:  WELLBUTRIN SR  Take 100 mg by mouth 2 (two) times daily.     feeding supplement Liqd  Take 237 mLs by mouth 2 (two) times daily between meals.     insulin aspart 100 UNIT/ML  injection  Commonly known as:  novoLOG  For CBG 70-120=No Insulin; CBG 120-150=1 unit; CBG 151-200=2 units; CBG 201-250=3 units; CBG 251-300=5 units; CBG 301-350=7 units; CBG 351-400=9 units.     levothyroxine 50 MCG tablet  Commonly known as:  SYNTHROID, LEVOTHROID  Take 50 mcg by mouth daily.     lisinopril 5 MG tablet  Commonly known as:  PRINIVIL,ZESTRIL  Take 5 mg by mouth daily.     multivitamin with minerals tablet  Take 1 tablet by mouth daily.     risperiDONE 0.5 MG tablet  Commonly known as:  RISPERDAL  Take 1 tablet (0.5 mg total) by mouth at bedtime.       Allergies  Allergen Reactions  . Atenolol     Bradycardia   . Other     Patient is allergic to a medicine for alzheimer  But does not know name  . Penicillins     ?   Follow-up Information   Follow up with Cala Bradford, MD.   Specialty:  Family Medicine   Contact information:   [address] [place] [address] [phone]       Please follow up. (Palliative Care follow up at SNF. )        The results of significant  diagnostics from this hospitalization (including imaging, microbiology, ancillary and laboratory) are listed below for reference.    Significant Diagnostic Studies: Ct Head Wo Contrast  04/05/2013   *RADIOLOGY REPORT*  Clinical Data: Altered mental status  CT HEAD WITHOUT CONTRAST  Technique:  Contiguous axial images were obtained from the base of the skull through the vertex without contrast.  Comparison: 05/26/2011  Findings: Examination is significantly limited by patient motion artifact.  The bony calvarium appears within normal limits. Atrophic changes are noted.  There are also changes consistent with prior cerebellar infarcts bilaterally.  These are stable from prior exam.  A lacunar infarct is again noted in the basal ganglia on the right.  No acute hemorrhage or infarct is seen.  IMPRESSION: Chronic changes without acute abnormality.   Original Report Authenticated By: Alcide Clever, M.D.   Dg Chest Port 1 View  04/05/2013   *RADIOLOGY REPORT*  Clinical Data: Short of breath, hypertension  PORTABLE CHEST - 1 VIEW  Comparison: Prior chest x-ray 03/29/2012  Findings: Cardiac leads overlie the chest.  The cardiac and mediastinal contours remain within normal limits.  There is atherosclerotic calcification in the transverse aorta. The lungs are well-aerated.  No edema, pleural effusion, pneumothorax or focal airspace consolidation.  No acute osseous abnormality. Degenerative changes noted in the bilateral shoulders.  IMPRESSION: No acute cardiopulmonary disease.   Original Report Authenticated By: Malachy Moan, M.D.    Microbiology: No results found for this or any previous visit (from the past 240 hour(s)).   Labs: Basic Metabolic Panel:  Recent Labs Lab 04/13/13 0515 04/14/13 0445 04/15/13 0850 04/16/13 0610 04/17/13 0615  NA 139 137 137 142 138  K 4.2 3.8 4.1 4.1 4.1  CL 105 100 102 106 102  CO2 24 26 22 26 25   GLUCOSE 161* 139* 126* 153* 146*  BUN 19 24* 26* 30* 24*  CREATININE 0.56 0.55 0.51 0.56 0.55  CALCIUM 9.0 9.5 9.3 9.5 9.3   Liver Function Tests: No results found for this basename: AST, ALT, ALKPHOS, BILITOT, PROT, ALBUMIN,  in the last 168 hours No results found for this basename: LIPASE, AMYLASE,  in the last 168 hours No results found for this basename: AMMONIA,  in the last 168 hours CBC:  Recent Labs Lab 04/13/13 0515 04/14/13 0445 04/15/13 0850 04/16/13 0610 04/17/13 0615  WBC 5.6 4.8 5.3 5.1 6.3  HGB 13.5 14.2 14.6 13.6 13.9  HCT 40.3 43.7 42.7 40.7 40.2  MCV 93.5 94.4 92.2 92.5 91.6  PLT 171 198 193 229 249   Cardiac Enzymes: No results found for this basename: CKTOTAL, CKMB, CKMBINDEX, TROPONINI,  in the last 168 hours BNP: BNP (last 3 results)  Recent Labs  04/05/13 1250  PROBNP 409.8*   CBG:  Recent Labs Lab 04/16/13 0747 04/16/13 1137 04/16/13 1613 04/16/13 2227 04/17/13 0821  GLUCAP 130* 161* 137*  186* 173*       Signed:  Ramaj Frangos,CHRISTOPHER  [facility] 04/17/2013, 10:34 AM

## 2013-04-13 NOTE — Progress Notes (Signed)
[facility] PROGRESS NOTE  Kellie King ZOX:[id number] DOB: [birth year] DOA: 04/05/2013 PCP: Cala Bradford, MD  Assessment/Plan: Principal Problem:   Septic shock Active Problems:   Dementia   UTI (lower urinary tract infection)   Leukocytosis   High anion gap metabolic acidosis   Acute kidney injury   Normocytic anemia   Hyperglycemia   Hypernatremia   Palliative care encounter   Adult failure to thrive   Altered mental status    1. Septic shock/UTI/ Leukocytosis: Patient presented with altered mental status, tachycardia, wcc 19 with left shift, as well as a positive urinary sediment with pyuria and bacteriuria. The clinical picture was consistent with sepsis, due to UTI. Blood cultures were sent off, she was commenced on iv Rocephin as well as supportive treatment. Blood cultures have remained negative so far. Urine culture grew E. Coli, sensitive to Rocephin, but resistant to the quinolones. Patient has remained afebrile, improved clinically, and as of 04/07/13, wcc had normalized. Rocephin was discontinued on 04/09/13, without deleterious effect. Sepsis has resolved.  2. Acute kidney injury: Creatine was 2.05 on admission, against a known baseline creatinine of 0.59 on 03/31/13. This was ARF, secondary to dehydration, volume depletion and sepsis. Nephrotoxins were avoided, and patient successfully managed with iv fluids. As of 04/08/13, creatinine had normalized at 0.65. ARF has resolved.  3. Hypernatremia: This was due to severe dehydration and volume depletion. Serum sodium was 164 on admission, no doubt contributory to AMS. Following hypotonic fluid administration, Sodium has normalized, and is 140 today.  4. Hyperglycemia: Managing with SSI, and CBGs are reasonable.  Alzheimer's Dementia/AMS: As described above, patient presented with altered mental status, described as confusion. She was also, not communicative, at the time of admission. No doubt, this was due to her acute  infective and profound metabolic derangements, which have now resolved. Clinically, she is now alert, although still not communicatve. Suspect close to baseline. Risperdal was reduced, due to AM lethargy.  5. Dysphagia: Evaluated by SLP, and recommended D1/Thin liquid.    Code Status: DNR/DNI.  Family Communication:  Disposition Plan: To be determined. Patient has reported continued decline over the past two years secondary to dementia. Minimal ambulation, incontinence, worsening memory. Kellie Creed NP, provided palliative care consultation, and had initial discussion with patient's daughter Kellie King # [phone]. DNR/DNI status confirmed. Per GOC meeting of 04/12/13. Patient will be discharged to SNF with Palliative care services.      Brief narrative: 72 y.o. 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 She was brought here by EMS. In the ED, she was initially tachycardic, with stable blood pressure, was given a liter of normal saline and her heart rate decreased. Basic metabolic showed a sodium of 166 a chloride of 128 and a creatinine of 2.6 (on 04/01/2011 her creatinine was 0.5) lactic acid was done which is 3.0 CBC was done, white count of 19 with a left shift. Urinalysis showed too many to count white blood cells and many bacteria. Admitted for further management.    Consultants:  Kellie Creed, NP, PMT.   Procedures:  N/A.   Antibiotics:  Rocephin 04/05/13-04/09/13  HPI/Subjective: No complaints.   Objective: Vital signs in last 24 hours: Temp:  [97.8 F (36.6 C)-99.5 F (37.5 C)] 97.8 F (36.6  C) (09/12 1418) Pulse Rate:  [85-105] 85 (09/12 1418) Resp:   [20-24] 22 (09/12 1418) BP: (109-129)/(54-57) 129/54 mmHg (09/12 1418) SpO2:  [95 %-100 %] 95 % (09/12 1418) Weight change:  Last BM Date: 04/13/13  Intake/Output from previous day: 09/11 0701 - 09/12 0700 In: 10 [P.O.:10] Out: 175 [Urine:175]     Physical Exam: General: Comfortable, alert, communicative, but incoherent, not short of breath at rest.  HEENT:  No clinical pallor, no jaundice, no conjunctival injection or discharge. Hydration is satisfactory.  NECK:  Supple, JVP not seen, no carotid bruits, no palpable lymphadenopathy, no palpable goiter. CHEST:  Clinically clear to auscultation, no wheezes, no crackles. HEART:  Sounds 1 and 2 heard, normal, regular, no murmurs. ABDOMEN:  Full, soft, non-tender, no palpable organomegaly, no palpable masses, normal bowel sounds. GENITALIA:  Not examnined. LOWER EXTREMITIES:  No pitting edema, palpable peripheral pulses. MUSCULOSKELETAL SYSTEM:  Generalized osteoarthritic changes. Has left UE/LE contractures. . CENTRAL NERVOUS SYSTEM:  Has dense left hemiplegia. .  Lab Results:  Recent Labs  04/12/13 0605 04/13/13 0515  WBC 5.4 5.6  HGB 13.6 13.5  HCT 41.8 40.3  PLT 155 171    Recent Labs  04/12/13 0605 04/13/13 0515  NA 139 139  K 4.3 4.2  CL 103 105  CO2 27 24  GLUCOSE 164* 161*  BUN 12 19  CREATININE 0.51 0.56  CALCIUM 9.0 9.0   Recent Results (from the past 240 hour(s))  URINE CULTURE     Status: None   Collection Time    04/05/13  1:52 PM      Result Value Range Status   Specimen Description URINE, RANDOM   Final   Special Requests NONE   Final   Culture  Setup Time     Final   Value: 04/06/2013 01:14     Performed at [facility] Count     Final   Value: >=100,000 COLONIES/ML     Performed at [facility]   Culture     Final   Value: ESCHERICHIA COLI     Performed at [facility]   Report Status 04/07/2013 FINAL   Final   Organism ID, Bacteria ESCHERICHIA COLI    Final  CULTURE, BLOOD (ROUTINE X 2)     Status: None   Collection Time    04/05/13  3:23 PM      Result Value Range Status   Specimen Description BLOOD HAND RIGHT   Final   Special Requests BOTTLES DRAWN AEROBIC ONLY .5CC   Final   Culture  Setup Time     Final   Value: 04/05/2013 22:09     Performed at [facility]   Culture     Final   Value: NO GROWTH 5 DAYS     Performed at [facility]   Report Status 04/11/2013 FINAL   Final  CULTURE, BLOOD (ROUTINE X 2)     Status: None   Collection Time    04/05/13  3:45 PM      Result Value Range Status   Specimen Description BLOOD RIGHT HAND   Final   Special Requests BOTTLES DRAWN AEROBIC ONLY 1CC   Final   Culture  Setup Time     Final   Value: 04/05/2013 22:10     Performed at [facility]   Culture     Final   Value: NO GROWTH 5 DAYS     Performed at [facility]   Report Status 04/11/2013 FINAL   Final  MRSA PCR SCREENING     Status: None   Collection Time    04/05/13  6:53 PM      Result Value Range Status   MRSA by PCR NEGATIVE  NEGATIVE Final   Comment:            The GeneXpert MRSA Assay (FDA     approved for NASAL specimens     only), is one component of a     comprehensive MRSA colonization     surveillance program. It is not     intended to diagnose MRSA     infection nor to guide or     monitor treatment for     MRSA infections.  URINE CULTURE     Status: None   Collection Time    04/06/13  8:49 AM      Result Value Range Status   Specimen Description URINE, CATHETERIZED   Final   Special Requests NONE   Final   Culture  Setup Time     Final   Value: 04/06/2013 09:16     Performed at [facility]   [facility] Count     Final   Value: NO GROWTH     Performed at [facility]   Culture     Final   Value: NO GROWTH     Performed at [facility]   Report Status 04/07/2013 FINAL   Final     Studies/Results: No results found.  Medications: Scheduled  Meds: . amLODipine  5 mg Oral Daily  . aspirin  81 mg Oral Daily  . feeding supplement  237 mL Oral BID BM  . insulin aspart  0-5 Units Subcutaneous QHS  . insulin aspart  0-9 Units Subcutaneous TID WC  . levothyroxine  50 mcg Oral QAC breakfast  . lisinopril  5 mg Oral Daily  . risperiDONE  0.5 mg Oral QHS   Continuous Infusions: . dextrose 5 % and 0.45% NaCl 20 mL/hr at 04/10/13 2130   PRN Meds:.hydrALAZINE, polyethylene glycol    LOS: 8 days   Alessia Gonsalez,CHRISTOPHER  [facility] Pager [phone]. If 8PM-8AM, please contact night-coverage at [email or web address], password [facility] 04/13/2013, 5:25 PM  LOS: 8 days

## 2013-04-14 LAB — BASIC METABOLIC PANEL
BUN: 24 mg/dL — ABNORMAL HIGH (ref 6–23)
CO2: 26 mEq/L (ref 19–32)
Calcium: 9.5 mg/dL (ref 8.4–10.5)
Creatinine, Ser: 0.55 mg/dL (ref 0.50–1.10)
Glucose, Bld: 139 mg/dL — ABNORMAL HIGH (ref 70–99)

## 2013-04-14 LAB — CBC
Hemoglobin: 14.2 g/dL (ref 12.0–15.0)
MCH: 30.7 pg (ref 26.0–34.0)
MCHC: 32.5 g/dL (ref 30.0–36.0)
MCV: 94.4 fL (ref 78.0–100.0)
RBC: 4.63 MIL/uL (ref 3.87–5.11)

## 2013-04-14 LAB — GLUCOSE, CAPILLARY: Glucose-Capillary: 135 mg/dL — ABNORMAL HIGH (ref 70–99)

## 2013-04-14 NOTE — Progress Notes (Signed)
[facility] PROGRESS NOTE  Kellie King:[id number] DOB: May 17, 1941 DOA: 04/05/2013 PCP: Cala Bradford, MD  Assessment/Plan: Principal Problem:   Septic shock Active Problems:   Dementia   UTI (lower urinary tract infection)   Leukocytosis   High anion gap metabolic acidosis   Acute kidney injury   Normocytic anemia   Hyperglycemia   Hypernatremia   Palliative care encounter   Adult failure to thrive   Altered mental status    1. Septic shock/UTI/ Leukocytosis: Patient presented with altered mental status, tachycardia, wcc 19 with left shift, as well as a positive urinary sediment with pyuria and bacteriuria. The clinical picture was consistent with sepsis, due to UTI. Blood cultures were sent off, she was commenced on iv Rocephin as well as supportive treatment. Blood cultures have remained negative so far. Urine culture grew E. Coli, sensitive to Rocephin, but resistant to the quinolones. Patient has remained afebrile, improved clinically, and as of 04/07/13, wcc had normalized. Rocephin was discontinued on 04/09/13, without deleterious effect. Sepsis has resolved.  2. Acute kidney injury: Creatine was 2.05 on admission, against a known baseline creatinine of 0.59 on 03/31/13. This was ARF, secondary to dehydration, volume depletion and sepsis. Nephrotoxins were avoided, and patient successfully managed with iv fluids. As of 04/08/13, creatinine had normalized at 0.65. ARF has resolved.  3. Hypernatremia: This was due to severe dehydration and volume depletion. Serum sodium was 164 on admission, no doubt contributory to AMS. Following hypotonic fluid administration, Sodium has normalized, and is 140 today.  4. Hyperglycemia: Managing with SSI, and CBGs are reasonable.  Alzheimer's Dementia/AMS: As described above, patient presented with altered mental status, described as confusion. She was also, not communicative, at the time of admission. No doubt, this was due to her acute  infective and profound metabolic derangements, which have now resolved. Clinically, she is now alert, although still not communicatve. Suspect close to baseline. Risperdal was reduced, due to AM lethargy.  5. Dysphagia: Evaluated by SLP, and recommended D1/Thin liquid.    Code Status: DNR/DNI.  Family Communication:  Disposition Plan: To be determined. Patient has reported continued decline over the past two years secondary to dementia. Minimal ambulation, incontinence, worsening memory. [name] NP, provided palliative care consultation, and had initial discussion with patient's daughter Kellie King # [phone]. DNR/DNI status confirmed. Per GOC meeting of 04/12/13. Patient will be discharged to SNF with Palliative care services.      Brief narrative: 72 y.o. 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 She was brought here by EMS. In the ED, she was initially tachycardic, with stable blood pressure, was given a liter of normal saline and her heart rate decreased. Basic metabolic showed a sodium of 166 a chloride of 128 and a creatinine of 2.6 (on 04/01/2011 her creatinine was 0.5) lactic acid was done which is 3.0 CBC was done, white count of 19 with a left shift. Urinalysis showed too many to count white blood cells and many bacteria. Admitted for further management.    Consultants:  [name], NP, PMT.   Procedures:  N/A.   Antibiotics:  Rocephin 04/05/13-04/09/13  HPI/Subjective: No new issues.   Objective: Vital signs in last 24 hours: Temp:  [97.6 F (36.4 C)-97.8 F (36.6 C)] 97.7 F (  36.5 C) (09/13 0541) Pulse Rate:  [85-90] 90 (09/13 0541) Resp:   [20-22] 20 (09/13 0541) BP: (129-135)/(54-63) 130/56 mmHg (09/13 0541) SpO2:  [95 %-99 %] 99 % (09/13 0541) Weight:  [50 kg (110 lb 3.7 oz)] 50 kg (110 lb 3.7 oz) (09/13 0541) Weight change: -2.6 kg (-5 lb 11.7 oz) Last BM Date: 04/13/13  Intake/Output from previous day: 09/12 0701 - 09/13 0700 In: 0  Out: 450 [Urine:450] Total I/O In: 60 [P.O.:60] Out: -    Physical Exam: General: Comfortable, alert, not short of breath at rest.  HEENT:  No clinical pallor, no jaundice, no conjunctival injection or discharge. Hydration is satisfactory.  NECK:  Supple, JVP not seen, no carotid bruits, no palpable lymphadenopathy, no palpable goiter. CHEST:  Clinically clear to auscultation, no wheezes, no crackles. HEART:  Sounds 1 and 2 heard, normal, regular, no murmurs. ABDOMEN:  Full, soft, non-tender, no palpable organomegaly, no palpable masses, normal bowel sounds. GENITALIA:  Not examnined. LOWER EXTREMITIES:  No pitting edema, palpable peripheral pulses. MUSCULOSKELETAL SYSTEM:  Generalized osteoarthritic changes. Has left UE/LE contractures. . CENTRAL NERVOUS SYSTEM:  Has dense left hemiplegia. .  Lab Results:  Recent Labs  04/13/13 0515 04/14/13 0445  WBC 5.6 4.8  HGB 13.5 14.2  HCT 40.3 43.7  PLT 171 198    Recent Labs  04/13/13 0515 04/14/13 0445  NA 139 137  K 4.2 3.8  CL 105 100  CO2 24 26  GLUCOSE 161* 139*  BUN 19 24*  CREATININE 0.56 0.55  CALCIUM 9.0 9.5   Recent Results (from the past 240 hour(s))  URINE CULTURE     Status: None   Collection Time    04/05/13  1:52 PM      Result Value Range Status   Specimen Description URINE, RANDOM   Final   Special Requests NONE   Final   Culture  Setup Time     Final   Value: 04/06/2013 01:14     Performed at [facility] Count     Final   Value: >=100,000 COLONIES/ML     Performed at [facility]   Culture     Final   Value: ESCHERICHIA COLI     Performed at [facility]    Report Status 04/07/2013 FINAL   Final   Organism ID, Bacteria ESCHERICHIA COLI   Final  CULTURE, BLOOD (ROUTINE X 2)     Status: None   Collection Time    04/05/13  3:23 PM      Result Value Range Status   Specimen Description BLOOD HAND RIGHT   Final   Special Requests BOTTLES DRAWN AEROBIC ONLY .[facility]   Final   Culture  Setup Time     Final   Value: 04/05/2013 22:09     Performed at [facility]   Culture     Final   Value: NO GROWTH 5 DAYS     Performed at [facility]   Report Status 04/11/2013 FINAL   Final  CULTURE, BLOOD (ROUTINE X 2)     Status: None   Collection Time    04/05/13  3:45 PM      Result Value Range Status   Specimen Description BLOOD RIGHT HAND   Final   Special Requests BOTTLES DRAWN AEROBIC ONLY 1CC   Final   Culture  Setup Time     Final   Value: 04/05/2013 22:10     Performed at  [facility]     Final   Value: NO GROWTH 5 DAYS     Performed at [facility]   Report Status 04/11/2013 FINAL   Final  MRSA PCR SCREENING     Status: None   Collection Time    04/05/13  6:53 PM      Result Value Range Status   MRSA by PCR NEGATIVE  NEGATIVE Final   Comment:            The GeneXpert MRSA Assay (FDA     approved for NASAL specimens     only), is one component of a     comprehensive MRSA colonization     surveillance program. It is not     intended to diagnose MRSA     infection nor to guide or     monitor treatment for     MRSA infections.  URINE CULTURE     Status: None   Collection Time    04/06/13  8:49 AM      Result Value Range Status   Specimen Description URINE, CATHETERIZED   Final   Special Requests NONE   Final   Culture  Setup Time     Final   Value: 04/06/2013 09:16     Performed at [facility]   [facility]     Final   Value: NO GROWTH     Performed at [facility]   Culture     Final   Value: NO GROWTH     Performed at [facility]   Report Status 04/07/2013  FINAL   Final     Studies/Results: No results found.  Medications: Scheduled Meds: . amLODipine  5 mg Oral Daily  . aspirin  81 mg Oral Daily  . feeding supplement  237 mL Oral BID BM  . insulin aspart  0-5 Units Subcutaneous QHS  . insulin aspart  0-9 Units Subcutaneous TID WC  . levothyroxine  50 mcg Oral QAC breakfast  . lisinopril  5 mg Oral Daily  . risperiDONE  0.5 mg Oral QHS   Continuous Infusions: . dextrose 5 % and 0.45% NaCl 20 mL/hr at 04/10/13 2130   PRN Meds:.hydrALAZINE, polyethylene glycol    LOS: 9 days   Jenese Mischke,CHRISTOPHER  [facility] Pager [phone]. If 8PM-8AM, please contact night-coverage at [email or web address], password [facility] 04/14/2013, 12:23 PM  LOS: 9 days

## 2013-04-15 LAB — BASIC METABOLIC PANEL
BUN: 26 mg/dL — ABNORMAL HIGH (ref 6–23)
CO2: 22 mEq/L (ref 19–32)
Chloride: 102 mEq/L (ref 96–112)
Creatinine, Ser: 0.51 mg/dL (ref 0.50–1.10)
Glucose, Bld: 126 mg/dL — ABNORMAL HIGH (ref 70–99)

## 2013-04-15 LAB — CBC
HCT: 42.7 % (ref 36.0–46.0)
MCV: 92.2 fL (ref 78.0–100.0)
RBC: 4.63 MIL/uL (ref 3.87–5.11)
RDW: 13.3 % (ref 11.5–15.5)
WBC: 5.3 10*3/uL (ref 4.0–10.5)

## 2013-04-15 LAB — GLUCOSE, CAPILLARY

## 2013-04-15 NOTE — Progress Notes (Signed)
[facility] PROGRESS NOTE  Kellie King ZOX:[id number] DOB: 1940/08/24 DOA: 04/05/2013 PCP: Cala Bradford, MD  Assessment/Plan: Principal Problem:   Septic shock Active Problems:   Dementia   UTI (lower urinary tract infection)   Leukocytosis   High anion gap metabolic acidosis   Acute kidney injury   Normocytic anemia   Hyperglycemia   Hypernatremia   Palliative care encounter   Adult failure to thrive   Altered mental status    1. Septic shock/UTI/ Leukocytosis: Patient presented with altered mental status, tachycardia, wcc 19 with left shift, as well as a positive urinary sediment with pyuria and bacteriuria. The clinical picture was consistent with sepsis, due to UTI. Blood cultures were sent off, she was commenced on iv Rocephin as well as supportive treatment. Blood cultures have remained negative so far. Urine culture grew E. Coli, sensitive to Rocephin, but resistant to the quinolones. Patient has remained afebrile, improved clinically, and as of 04/07/13, wcc had normalized. Rocephin was discontinued on 04/09/13, without deleterious effect. Sepsis has resolved.  2. Acute kidney injury: Creatine was 2.05 on admission, against a known baseline creatinine of 0.59 on 03/31/13. This was ARF, secondary to dehydration, volume depletion and sepsis. Nephrotoxins were avoided, and patient successfully managed with iv fluids. As of 04/08/13, creatinine had normalized at 0.65. ARF has resolved.  3. Hypernatremia: This was due to severe dehydration and volume depletion. Serum sodium was 164 on admission, no doubt contributory to AMS. Following hypotonic fluid administration, Sodium has normalized, and is 140 today.  4. Hyperglycemia: Managing with SSI, and CBGs are reasonable.  Alzheimer's Dementia/AMS: As described above, patient presented with altered mental status, described as confusion. She was also, not communicative, at the time of admission. No doubt, this was due to her acute  infective and profound metabolic derangements, which have now resolved. Clinically, she is now alert, although still not communicatve. Suspect close to baseline. Risperdal was reduced, due to AM lethargy.  5. Dysphagia: Evaluated by SLP, and recommended D1/Thin liquid.    Code Status: DNR/DNI.  Family Communication:  Disposition Plan: To be determined. Patient has reported continued decline over the past two years secondary to dementia. Minimal ambulation, incontinence, worsening memory. Kellie Creed NP, provided palliative care consultation, and had initial discussion with patient's daughter Kellie King # [phone]. DNR/DNI status confirmed. Per GOC meeting of 04/12/13. Patient will be discharged to SNF with Palliative care services.      Brief narrative: 72 y.o. 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 She was brought here by EMS. In the ED, she was initially tachycardic, with stable blood pressure, was given a liter of normal saline and her heart rate decreased. Basic metabolic showed a sodium of 166 a chloride of 128 and a creatinine of 2.6 (on 04/01/2011 her creatinine was 0.5) lactic acid was done which is 3.0 CBC was done, white count of 19 with a left shift. Urinalysis showed too many to count white blood cells and many bacteria. Admitted for further management.    Consultants:  Kellie Creed, NP, PMT.   Procedures:  N/A.   Antibiotics:  Rocephin 04/05/13-04/09/13  HPI/Subjective: No new issues. No new findings.  Objective: Vital signs in last 24 hours: Temp:  [98.1 F (36.7 C)] 98.1 F (36.7  C) (09/14 0600) Pulse Rate:  [98] 98 (09/14 0600) Resp:  [18] 18  (09/14 0600) BP: (112)/(71) 112/71 mmHg (09/14 0600) SpO2:  [97 %] 97 % (09/14 0600) Weight:  [48.6 kg (107 lb 2.3 oz)] 48.6 kg (107 lb 2.3 oz) (09/14 0600) Weight change: -1.4 kg (-3 lb 1.4 oz) Last BM Date: 04/13/13  Intake/Output from previous day: 09/13 0701 - 09/14 0700 In: 60 [P.O.:60] Out: 575 [Urine:575]     Physical Exam: General: Comfortable, alert, not short of breath at rest.  HEENT:  No clinical pallor, no jaundice, no conjunctival injection or discharge. Hydration is satisfactory.  NECK:  Supple, JVP not seen, no carotid bruits, no palpable lymphadenopathy, no palpable goiter. CHEST:  Clinically clear to auscultation, no wheezes, no crackles. HEART:  Sounds 1 and 2 heard, normal, regular, no murmurs. ABDOMEN:  Full, soft, non-tender, no palpable organomegaly, no palpable masses, normal bowel sounds. GENITALIA:  Not examnined. LOWER EXTREMITIES:  No pitting edema, palpable peripheral pulses. MUSCULOSKELETAL SYSTEM:  Generalized osteoarthritic changes. Has left UE/LE contractures. . CENTRAL NERVOUS SYSTEM:  Has dense left hemiplegia. .  Lab Results:  Recent Labs  04/14/13 0445 04/15/13 0850  WBC 4.8 5.3  HGB 14.2 14.6  HCT 43.7 42.7  PLT 198 193    Recent Labs  04/14/13 0445 04/15/13 0850  NA 137 137  K 3.8 4.1  CL 100 102  CO2 26 22  GLUCOSE 139* 126*  BUN 24* 26*  CREATININE 0.55 0.51  CALCIUM 9.5 9.3   Recent Results (from the past 240 hour(s))  MRSA PCR SCREENING     Status: None   Collection Time    04/05/13  6:53 PM      Result Value Range Status   MRSA by PCR NEGATIVE  NEGATIVE Final   Comment:            The GeneXpert MRSA Assay (FDA     approved for NASAL specimens     only), is one component of a     comprehensive MRSA colonization     surveillance program. It is not     intended to diagnose MRSA     infection nor to guide or     monitor treatment for     MRSA infections.  URINE CULTURE     Status: None   Collection Time     04/06/13  8:49 AM      Result Value Range Status   Specimen Description URINE, CATHETERIZED   Final   Special Requests NONE   Final   Culture  Setup Time     Final   Value: 04/06/2013 09:16     Performed at [facility]   Colony Count     Final   Value: NO GROWTH     Performed at [facility]   Culture     Final   Value: NO GROWTH     Performed at [facility]   Report Status 04/07/2013 FINAL   Final     Studies/Results: No results found.  Medications: Scheduled Meds: . amLODipine  5 mg Oral Daily  . aspirin  81 mg Oral Daily  . feeding supplement  237 mL Oral BID BM  . insulin aspart  0-5 Units Subcutaneous QHS  . insulin aspart  0-9 Units Subcutaneous TID WC  . levothyroxine  50 mcg Oral QAC breakfast  . lisinopril  5 mg Oral Daily  . risperiDONE  0.5 mg Oral QHS   Continuous  Infusions: . dextrose 5 % and 0.45% NaCl 20 mL/hr at 04/10/13 2130   PRN Meds:.hydrALAZINE, polyethylene glycol    LOS: 10 days   Cali Cuartas,CHRISTOPHER  [facility] Pager [phone]. If 8PM-8AM, please contact night-coverage at [email or web address], password [facility] 04/15/2013, 4:44 PM  LOS: 10 days

## 2013-04-15 NOTE — Progress Notes (Signed)
Clinical Social Worker (CSW) attempted to contact daughter [name] at [phone] to give bed offers. Daughter did not answer CSW left message. CSW did not leave bed offers with patient because per chart patient is not alert and oriented.   Jetta Lout, LCSWA Weekend CSW [phone]

## 2013-04-16 LAB — GLUCOSE, CAPILLARY
Glucose-Capillary: 129 mg/dL — ABNORMAL HIGH (ref 70–99)
Glucose-Capillary: 154 mg/dL — ABNORMAL HIGH (ref 70–99)
Glucose-Capillary: 130 mg/dL — ABNORMAL HIGH (ref 70–99)
Glucose-Capillary: 161 mg/dL — ABNORMAL HIGH (ref 70–99)
Glucose-Capillary: 137 mg/dL — ABNORMAL HIGH (ref 70–99)
Glucose-Capillary: 186 mg/dL — ABNORMAL HIGH (ref 70–99)

## 2013-04-16 LAB — BASIC METABOLIC PANEL
BUN: 30 mg/dL — ABNORMAL HIGH (ref 6–23)
Chloride: 106 mEq/L (ref 96–112)
GFR calc Af Amer: >90 mL/min (ref >90)
Glucose, Bld: 153 mg/dL — ABNORMAL HIGH (ref 70–99)
Potassium: 4.1 mEq/L (ref 3.5–5.1)

## 2013-04-16 LAB — CBC
HCT: 40.7 % (ref 36.0–46.0)
Hemoglobin: 13.6 g/dL (ref 12.0–15.0)
RBC: 4.4 MIL/uL (ref 3.87–5.11)
WBC: 5.1 10*3/uL (ref 4.0–10.5)

## 2013-04-16 NOTE — Progress Notes (Signed)
[facility] PROGRESS NOTE  Kellie King OZH:[id number] DOB: Oct 13, 1940 DOA: 04/05/2013 PCP: Cala Bradford, MD  Assessment/Plan: Principal Problem:   Septic shock Active Problems:   Dementia   UTI (lower urinary tract infection)   Leukocytosis   High anion gap metabolic acidosis   Acute kidney injury   Normocytic anemia   Hyperglycemia   Hypernatremia   Palliative care encounter   Adult failure to thrive   Altered mental status    1. Septic shock/UTI/ Leukocytosis: Patient presented with altered mental status, tachycardia, wcc 19 with left shift, as well as a positive urinary sediment with pyuria and bacteriuria. The clinical picture was consistent with sepsis, due to UTI. Blood cultures were sent off, she was commenced on iv Rocephin as well as supportive treatment. Blood cultures have remained negative so far. Urine culture grew E. Coli, sensitive to Rocephin, but resistant to the quinolones. Patient has remained afebrile, improved clinically, and as of 04/07/13, wcc had normalized. Rocephin was discontinued on 04/09/13, without deleterious effect. Sepsis has resolved.  2. Acute kidney injury: Creatine was 2.05 on admission, against a known baseline creatinine of 0.59 on 03/31/13. This was ARF, secondary to dehydration, volume depletion and sepsis. Nephrotoxins were avoided, and patient successfully managed with iv fluids. As of 04/08/13, creatinine had normalized at 0.65. ARF has resolved. Pushing oral fluids.  3. Hypernatremia: This was due to severe dehydration and volume depletion. Serum sodium was 164 on admission, no doubt contributory to AMS. Following hypotonic fluid administration, Sodium has normalized, and is 140 today.  4. Hyperglycemia: Managing with SSI, and CBGs are reasonable.  Alzheimer's Dementia/AMS: As described above, patient presented with altered mental status, described as confusion. She was also, not communicative, at the time of admission. No doubt, this was  due to her acute infective and profound metabolic derangements, which have now resolved. Clinically, she is now alert, although still not communicatve. Suspect close to baseline. Risperdal was reduced, due to AM lethargy.  5. Dysphagia: Evaluated by SLP, and recommended D1/Thin liquid.    Code Status: DNR/DNI.  Family Communication:  Disposition Plan: To be determined. Patient has reported continued decline over the past two years secondary to dementia. Minimal ambulation, incontinence, worsening memory. Kellie Creed NP, provided palliative care consultation, and had initial discussion with patient's daughter Kellie King # [phone]. DNR/DNI status confirmed. Per GOC meeting of 04/12/13. Patient will be discharged to SNF with Palliative care services.      Brief narrative: 72 y.o. 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 She was brought here by EMS. In the ED, she was initially tachycardic, with stable blood pressure, was given a liter of normal saline and her heart rate decreased. Basic metabolic showed a sodium of 166 a chloride of 128 and a creatinine of 2.6 (on 04/01/2011 her creatinine was 0.5) lactic acid was done which is 3.0 CBC was done, white count of 19 with a left shift. Urinalysis showed too many to count white blood cells and many bacteria. Admitted for further management.    Consultants:  Kellie Creed, NP, PMT.   Procedures:  N/A.   Antibiotics:  Rocephin 04/05/13-04/09/13  HPI/Subjective: Awaiting SNF.   Objective: Vital signs in last 24 hours: Temp:  [98.1 F (36.7 C)-99 F (37.2 C)]  98.1 F (36.7 C) (09/15 0621) Pulse Rate:  [86-95] 87 (09/15  0621) Resp:  [16] 16 (09/15 0621) BP: (108-138)/(52-93) 108/53 mmHg (09/15 0621) SpO2:  [98 %-100 %] 100 % (09/15 0621) Weight change:  Last BM Date: 04/13/13  Intake/Output from previous day: 09/14 0701 - 09/15 0700 In: 180 [P.O.:180] Out: 700 [Urine:700] Total I/O In: 60 [P.O.:60] Out: -    Physical Exam: General: Comfortable, alert, not short of breath at rest.  HEENT:  No clinical pallor, no jaundice, no conjunctival injection or discharge. Hydration is satisfactory.  NECK:  Supple, JVP not seen, no carotid bruits, no palpable lymphadenopathy, no palpable goiter. CHEST:  Clinically clear to auscultation, no wheezes, no crackles. HEART:  Sounds 1 and 2 heard, normal, regular, no murmurs. ABDOMEN:  Full, soft, non-tender, no palpable organomegaly, no palpable masses, normal bowel sounds. GENITALIA:  Not examnined. LOWER EXTREMITIES:  No pitting edema, palpable peripheral pulses. MUSCULOSKELETAL SYSTEM:  Generalized osteoarthritic changes. Has left UE/LE contractures. . CENTRAL NERVOUS SYSTEM:  Has dense left hemiplegia. .  Lab Results:  Recent Labs  04/15/13 0850 04/16/13 0610  WBC 5.3 5.1  HGB 14.6 13.6  HCT 42.7 40.7  PLT 193 229    Recent Labs  04/15/13 0850 04/16/13 0610  NA 137 142  K 4.1 4.1  CL 102 106  CO2 22 26  GLUCOSE 126* 153*  BUN 26* 30*  CREATININE 0.51 0.56  CALCIUM 9.3 9.5   No results found for this or any previous visit (from the past 240 hour(s)).   Studies/Results: No results found.  Medications: Scheduled Meds: . amLODipine  5 mg Oral Daily  . aspirin  81 mg Oral Daily  . feeding supplement  237 mL Oral BID BM  . insulin aspart  0-5 Units Subcutaneous QHS  . insulin aspart  0-9 Units Subcutaneous TID WC  . levothyroxine  50 mcg Oral QAC breakfast  . lisinopril  5 mg Oral Daily  . risperiDONE  0.5 mg Oral QHS   Continuous Infusions:   PRN Meds:.hydrALAZINE, polyethylene glycol    LOS: 11 days   Kellie King,Kellie King  [facility] Pager [phone]. If 8PM-8AM, please contact night-coverage at [email or web address], password [facility] 04/16/2013, 12:19 PM  LOS: 11 days

## 2013-04-16 NOTE — Clinical Social Work Note (Signed)
Clinical Social Worker continuing to follow patient and family for support and discharge planning needs.  CSW spoke with patient daughter over the phone to provide bed offers - patient daughter has chosen [facility].  CSW spoke with facility who is agreeable to patient admission.  Patient daughter plans to complete paperwork at the facility at 11:00am tomorrow.  CSW has contacted [facility] and awaiting response for authorization of SNF.  CSW to contact patient daughter tomorrow regarding discharge plans.  CSW remains available for support and to facilitate patient discharge needs.  Macario Golds, [place] [phone]

## 2013-04-17 LAB — BASIC METABOLIC PANEL
BUN: 24 mg/dL — ABNORMAL HIGH (ref 6–23)
Chloride: 102 mEq/L (ref 96–112)
Creatinine, Ser: 0.55 mg/dL (ref 0.50–1.10)
GFR calc Af Amer: >90 mL/min (ref >90)
Glucose, Bld: 146 mg/dL — ABNORMAL HIGH (ref 70–99)

## 2013-04-17 LAB — GLUCOSE, CAPILLARY: Glucose-Capillary: 99 mg/dL (ref 70–99)

## 2013-04-17 LAB — CBC
HCT: 40.2 % (ref 36.0–46.0)
Hemoglobin: 13.9 g/dL (ref 12.0–15.0)
MCV: 91.6 fL (ref 78.0–100.0)
RDW: 13.1 % (ref 11.5–15.5)
WBC: 6.3 10*3/uL (ref 4.0–10.5)

## 2013-04-17 MED ORDER — RISPERIDONE 0.5 MG PO TABS: 0.5000 mg | ORAL_TABLET | Freq: Every day | ORAL | Status: AC

## 2013-04-17 MED ORDER — ENSURE COMPLETE PO LIQD: 237.0000 mL | Freq: Two times a day (BID) | ORAL | Status: DC

## 2013-04-17 MED ORDER — AMLODIPINE BESYLATE 5 MG PO TABS: 5.0000 mg | ORAL_TABLET | Freq: Every day | ORAL | Status: AC

## 2013-04-17 MED ORDER — INSULIN ASPART 100 UNIT/ML ~~LOC~~ SOLN: SUBCUTANEOUS | Status: DC

## 2013-04-17 NOTE — Clinical Social Work Note (Signed)
Clinical Social Worker facilitated patient discharge including contacting patient family and facility to confirm patient discharge plans.  Clinical information faxed to facility and family agreeable with plan.  Insurance authorization received for placement and transport.  CSW arranged ambulance transport via [facility] to [place].  RN to call report prior to discharge.  Clinical Social Worker will sign off for now as social work intervention is no longer needed. Please consult [place] again if new need arises.  Macario Golds, [place] [phone]

## 2013-04-17 NOTE — Progress Notes (Signed)
[facility] PROGRESS NOTE  Kellie King WGN:[id number] DOB: 1940-11-18 DOA: 04/05/2013 PCP: Cala Bradford, MD  Assessment/Plan: Principal Problem:   Septic shock Active Problems:   Dementia   UTI (lower urinary tract infection)   Leukocytosis   High anion gap metabolic acidosis   Acute kidney injury   Normocytic anemia   Hyperglycemia   Hypernatremia   Palliative care encounter   Adult failure to thrive   Altered mental status    1. Septic shock/UTI/ Leukocytosis: Patient presented with altered mental status, tachycardia, wcc 19 with left shift, as well as a positive urinary sediment with pyuria and bacteriuria. The clinical picture was consistent with sepsis, due to UTI. Blood cultures were sent off, she was commenced on iv Rocephin as well as supportive treatment. Blood cultures have remained negative so far. Urine culture grew E. Coli, sensitive to Rocephin, but resistant to the quinolones. Patient has remained afebrile, improved clinically, and as of 04/07/13, wcc had normalized. Rocephin was discontinued on 04/09/13, without deleterious effect. Sepsis has resolved.  2. Acute kidney injury: Creatine was 2.05 on admission, against a known baseline creatinine of 0.59 on 03/31/13. This was ARF, secondary to dehydration, volume depletion and sepsis. Nephrotoxins were avoided, and patient successfully managed with iv fluids. As of 04/08/13, creatinine had normalized at 0.65. ARF has resolved. Pushing oral fluids.  3. Hypernatremia: This was due to severe dehydration and volume depletion. Serum sodium was 164 on admission, no doubt contributory to AMS. Following hypotonic fluid administration, Sodium has normalized, and is 140 today.  4. Hyperglycemia: Managing with SSI, and CBGs are reasonable.  Alzheimer's Dementia/AMS: As described above, patient presented with altered mental status, described as confusion. She was also, not communicative, at the time of admission. No doubt, this was  due to her acute infective and profound metabolic derangements, which have now resolved. Clinically, she is now alert, although still not communicatve. Suspect close to baseline. Risperdal was reduced, due to AM lethargy.  5. Dysphagia: Evaluated by SLP, and recommended D1/Thin liquid.    Code Status: DNR/DNI.  Family Communication:  Disposition Plan: To be determined. Patient has reported continued decline over the past two years secondary to dementia. Minimal ambulation, incontinence, worsening memory. [name] NP, provided palliative care consultation, and had initial discussion with patient's daughter [name] # [phone]. DNR/DNI status confirmed. Per GOC meeting of 04/12/13. Patient will be discharged to SNF with Palliative care services.      Brief narrative: 72 y.o. 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 She was brought here by EMS. In the ED, she was initially tachycardic, with stable blood pressure, was given a liter of normal saline and her heart rate decreased. Basic metabolic showed a sodium of 166 a chloride of 128 and a creatinine of 2.6 (on 04/01/2011 her creatinine was 0.5) lactic acid was done which is 3.0 CBC was done, white count of 19 with a left shift. Urinalysis showed too many to count white blood cells and many bacteria. Admitted for further management.    Consultants:  [name], NP, PMT.   Procedures:  N/A.   Antibiotics:  Rocephin 04/05/13-04/09/13  HPI/Subjective: No new issues. Awaiting SNF.   Objective: Vital signs in last 24 hours: Temp:  [97.6 F (36.4 C)-98.7  F (37.1 C)] 97.6 F (36.4 C) (09/16 0614) Pulse Rate:   [64-94] 90 (09/16 0614) Resp:  [16-18] 18 (09/16 0614) BP: (116-137)/(48-117) 116/48 mmHg (09/16 0614) SpO2:  [99 %-100 %] 100 % (09/16 1610) Weight change:  Last BM Date: 04/13/13  Intake/Output from previous day: 09/15 0701 - 09/16 0700 In: 740 [P.O.:740] Out: 250 [Urine:250]     Physical Exam: General: Comfortable, alert, not short of breath at rest.  HEENT:  No clinical pallor, no jaundice, no conjunctival injection or discharge. Hydration is satisfactory.  NECK:  Supple, JVP not seen, no carotid bruits, no palpable lymphadenopathy, no palpable goiter. CHEST:  Clinically clear to auscultation, no wheezes, no crackles. HEART:  Sounds 1 and 2 heard, normal, regular, no murmurs. ABDOMEN:  Full, soft, non-tender, no palpable organomegaly, no palpable masses, normal bowel sounds. GENITALIA:  Not examnined. LOWER EXTREMITIES:  No pitting edema, palpable peripheral pulses. MUSCULOSKELETAL SYSTEM:  Generalized osteoarthritic changes. Has left UE/LE contractures. . CENTRAL NERVOUS SYSTEM:  Has dense left hemiplegia. .  Lab Results:  Recent Labs  04/16/13 0610 04/17/13 0615  WBC 5.1 6.3  HGB 13.6 13.9  HCT 40.7 40.2  PLT 229 249    Recent Labs  04/16/13 0610 04/17/13 0615  NA 142 138  K 4.1 4.1  CL 106 102  CO2 26 25  GLUCOSE 153* 146*  BUN 30* 24*  CREATININE 0.56 0.55  CALCIUM 9.5 9.3   No results found for this or any previous visit (from the past 240 hour(s)).   Studies/Results: No results found.  Medications: Scheduled Meds: . amLODipine  5 mg Oral Daily  . aspirin  81 mg Oral Daily  . feeding supplement  237 mL Oral BID BM  . insulin aspart  0-5 Units Subcutaneous QHS  . insulin aspart  0-9 Units Subcutaneous TID WC  . levothyroxine  50 mcg Oral QAC breakfast  . lisinopril  5 mg Oral Daily  . risperiDONE  0.5 mg Oral QHS   Continuous Infusions:   PRN Meds:.hydrALAZINE, polyethylene glycol    LOS: 12 days   Brylynn Hanssen,CHRISTOPHER  [facility] Pager [phone]. If 8PM-8AM, please contact night-coverage at [email or web address], password [facility] 04/17/2013, 10:21 AM  LOS: 12 days

## 2013-04-17 NOTE — Progress Notes (Signed)
NURSING PROGRESS NOTE  Kellie King [id number] Discharge Data: 04/17/2013 4:56 PM Attending Provider: No att. providers found WUJ:WJXBJ,YNWGNFA S, MD     Aris Lot to be D/C'd [facility] per MD order. Receiving RN was given a report about the patient.  Discussed with the patient the After Visit Summary and all questions fully answered. All belongings returned to patient for patient to take home.   Last Vital Signs:  Blood pressure 107/89, pulse 98, temperature 97.9 F (36.6 C), temperature source Axillary, resp. rate 20, height 5\' 4"  (1.626 m), weight 48.6 kg (107 lb 2.3 oz), SpO2 99.00%.  Discharge Medication List   Medication List    STOP taking these medications       mirtazapine 15 MG tablet  Commonly known as:  REMERON     venlafaxine XR 75 MG 24 hr capsule  Commonly known as:  EFFEXOR-XR      TAKE these medications       amLODipine 5 MG tablet  Commonly known as:  NORVASC  Take 1 tablet (5 mg total) by mouth daily.     aspirin 81 MG chewable tablet  Chew 81 mg by mouth daily.     buPROPion 100 MG 12 hr tablet  Commonly known as:  WELLBUTRIN SR  Take 100 mg by mouth 2 (two) times daily.     feeding supplement Liqd  Take 237 mLs by mouth 2 (two) times daily between meals.     insulin aspart 100 UNIT/ML injection  Commonly known as:  novoLOG  For CBG 70-120=No Insulin; CBG 120-150=1 unit; CBG 151-200=2 units; CBG 201-250=3 units; CBG 251-300=5 units; CBG 301-350=7 units; CBG 351-400=9 units.     levothyroxine 50 MCG tablet  Commonly known as:  SYNTHROID, LEVOTHROID  Take 50 mcg by mouth daily.     lisinopril 5 MG tablet  Commonly known as:  PRINIVIL,ZESTRIL  Take 5 mg by mouth daily.     multivitamin with minerals tablet  Take 1 tablet by mouth daily.     risperiDONE 0.5 MG tablet  Commonly known as:  RISPERDAL  Take 1 tablet (0.5 mg total) by mouth at bedtime.

## 2013-04-24 ENCOUNTER — Non-Acute Institutional Stay (SKILLED_NURSING_FACILITY): Payer: Medicare Other | Admitting: Internal Medicine

## 2013-04-24 DIAGNOSIS — E870 Hyperosmolality and hypernatremia: Secondary | ICD-10-CM

## 2013-04-24 DIAGNOSIS — N179 Acute kidney failure, unspecified: Secondary | ICD-10-CM

## 2013-04-24 DIAGNOSIS — N10 Acute tubulo-interstitial nephritis: Secondary | ICD-10-CM

## 2013-04-24 NOTE — Progress Notes (Signed)
Patient ID: Kellie King, female   DOB: [birth year], 72 y.o.   MRN: [id number]  Facility; [facility] Chief complaint; admission to SNF post admit to [facility] from September 3 to September 16  History; this is a 72 year old woman with a history of a right sided stroke with left hemiparesis, dementia and schizoaffective disorder. She was apparently transferred from another nursing home having recently been in the hospital for acute delirium secondary to an Escherichia coli pyelonephritis. The patient was found to have extreme hypernatremia with a high of 164 a BUN of 120 and a creatinine of 2.05 she was felt to be in septic shock however as far as I can see blood and urine cultures were negative. She presented with a picture of septic and hypovolemic shock and required aggressive support on admission. She was seen by the palliative care to although I don't exactly see the results of this. She appears to be a full code in the facility as far as I can tell  Past Medical History  Diagnosis Date  . CVA (cerebral vascular accident)     right  . Dementia   . Schizo affective schizophrenia   . Hypertension   . Thyroid disease   . Diabetes mellitus   . Anxiety   . Interstitial cystitis     has stimulator in her back  . Allergic rhinitis   . DDD (degenerative disc disease), lumbar   . Hypercholesterolemia   . Frequent PVCs   . Alzheimer disease   . Spinal stenosis     lumbar bulging discs   Past Surgical History  Procedure Laterality Date  . Tubal ligation    . Interstitial cystitis      surgery for this  . Foot surgery      bilateral feet surgery on the bone  . Implantation / placement epidural neurostimulator electrodes     Medications; Norvasc 5 mg daily, ASA 81 daily, Wellbutrin SR 100 mg twice a day, Synthroid 50 mcg daily Prinivil 5 mg every morning, Risperdal 0.5 each bedtime. She is on an insulin sliding scale though she does not have a formal diagnosis of diabetes I believe  she was hyperglycemic during her initial presentation to hospital.   Social ;reports that she has never smoked. She has never used smokeless tobacco. She reports that she does not drink alcohol or use illicit drugs. She apparently came from a nursing home to hospital although I don't see which one. She does not come with any advanced directives.  No family history on file. Not available from any current source.  Review of systems; this is not possible secondary to dementia with possible coexistent depression  Physical examination; Gen. this is a frail woman lying in bed. She is restless not easy to examine HEENT very poor dentition no oral oral lesions were seen dry mucous membranes Respiratory clear entry bilaterally Cardiac heart sounds are distant she does not appear to be that dehydrated at the bedside Abdomen; no liver no spleen no masses no tenderness GU bladder is not distended nontender no CVA tenderness Skin No pressure is are seen although she is at extreme risk Neurologic left arm is held in a contracted position she is better use of both her legs although I'm not able to test as a. Probable left upper motor seventh Mental status; not easy to formally evaluate this. She makes little eye contact poor attention. Was able to respond to some of my questions for instance she had 3 children but  then could not name them would not respond to my question about the place or year  Impression/plan #1 probable severe dementia complicated by the possibility of coexistent delirium and depression. She does have a background psychiatric history the, I don't have a great sense of whether this could be playing a role here. CT scan of the head showed a lacunar infarct in the basal ganglia on the right and no acute infarct the. #2 recent history of an Escherichia coli UTI treated with intravenous antibiotics. #3 severe hypernatremia this will need to be rechecked. #4 late effect right hemisphere CVA  with left upper greater than lower extremity paralysis #5 hypothyroidism on replacement #6 schizoaffective disorder on Risperdal and Wellbutrin. A much of a role this is playing in this presentation is not clear to me at the moment. Certainly seemed as though an element of depression with possible.  This is a very frail patient in a somewhat unstable situation. I will repeat her lab work. We'll need to keep track of both her fluid and nutritional intake. She has been placed on a pured diet with thin liquids #4 had hyperglycemia in the hospital although I am not certain she has been formally diagnosed as a diabetic this may have been an accompaniment of her sepsis.

## 2013-06-25 ENCOUNTER — Emergency Department (HOSPITAL_COMMUNITY): Payer: Medicare Other

## 2013-06-25 ENCOUNTER — Emergency Department (HOSPITAL_COMMUNITY)
Admission: EM | Admit: 2013-06-25 | Discharge: 2013-06-25 | Disposition: A | Discharge status: Home / Self Care | Payer: Medicare Other | Attending: Emergency Medicine | Admitting: Emergency Medicine

## 2013-06-25 ENCOUNTER — Encounter (HOSPITAL_COMMUNITY): Payer: Self-pay | Admitting: Emergency Medicine

## 2013-06-25 DIAGNOSIS — E119 Type 2 diabetes mellitus without complications: Secondary | ICD-10-CM | POA: Insufficient documentation

## 2013-06-25 DIAGNOSIS — Z79899 Other long term (current) drug therapy: Secondary | ICD-10-CM | POA: Insufficient documentation

## 2013-06-25 DIAGNOSIS — Z8673 Personal history of transient ischemic attack (TIA), and cerebral infarction without residual deficits: Secondary | ICD-10-CM | POA: Insufficient documentation

## 2013-06-25 DIAGNOSIS — Z8739 Personal history of other diseases of the musculoskeletal system and connective tissue: Secondary | ICD-10-CM | POA: Insufficient documentation

## 2013-06-25 DIAGNOSIS — F411 Generalized anxiety disorder: Secondary | ICD-10-CM | POA: Insufficient documentation

## 2013-06-25 DIAGNOSIS — Z7982 Long term (current) use of aspirin: Secondary | ICD-10-CM | POA: Insufficient documentation

## 2013-06-25 DIAGNOSIS — IMO0002 Reserved for concepts with insufficient information to code with codable children: Secondary | ICD-10-CM | POA: Insufficient documentation

## 2013-06-25 DIAGNOSIS — Z8709 Personal history of other diseases of the respiratory system: Secondary | ICD-10-CM | POA: Insufficient documentation

## 2013-06-25 DIAGNOSIS — Y939 Activity, unspecified: Secondary | ICD-10-CM | POA: Insufficient documentation

## 2013-06-25 DIAGNOSIS — Y92009 Unspecified place in unspecified non-institutional (private) residence as the place of occurrence of the external cause: Secondary | ICD-10-CM | POA: Insufficient documentation

## 2013-06-25 DIAGNOSIS — F259 Schizoaffective disorder, unspecified: Secondary | ICD-10-CM | POA: Insufficient documentation

## 2013-06-25 DIAGNOSIS — Z8742 Personal history of other diseases of the female genital tract: Secondary | ICD-10-CM | POA: Insufficient documentation

## 2013-06-25 DIAGNOSIS — T17308A Unspecified foreign body in larynx causing other injury, initial encounter: Secondary | ICD-10-CM

## 2013-06-25 DIAGNOSIS — E079 Disorder of thyroid, unspecified: Secondary | ICD-10-CM | POA: Insufficient documentation

## 2013-06-25 DIAGNOSIS — I10 Essential (primary) hypertension: Secondary | ICD-10-CM | POA: Insufficient documentation

## 2013-06-25 DIAGNOSIS — G309 Alzheimer's disease, unspecified: Secondary | ICD-10-CM | POA: Insufficient documentation

## 2013-06-25 DIAGNOSIS — F0280 Dementia in other diseases classified elsewhere without behavioral disturbance: Secondary | ICD-10-CM | POA: Insufficient documentation

## 2013-06-25 DIAGNOSIS — Z880 Allergy status to penicillin: Secondary | ICD-10-CM | POA: Insufficient documentation

## 2013-06-25 NOTE — ED Provider Notes (Signed)
CSN: [id number]     Arrival date & time 06/25/13  0025 History   First MD Initiated Contact with Patient 06/25/13 0109     Chief Complaint  Patient presents with  . Airway Obstruction    pt choke on a piece of chicken at home   (Consider location/radiation/quality/duration/timing/severity/associated sxs/prior Treatment) HPI Comments: Pt comes in with cc of choking. Pt is demented, unable to provide hx. LEVEL 5 CAVEAT FOR AMS. We have been trying to reach family, and there has been no response.  Currently, patient as no dib, wheezing, chest pain, neck pain.  The history is provided by the patient.    Past Medical History  Diagnosis Date  . CVA (cerebral vascular accident)     right  . Dementia   . Schizo affective schizophrenia   . Hypertension   . Thyroid disease   . Diabetes mellitus   . Anxiety   . Interstitial cystitis     has stimulator in her back  . Allergic rhinitis   . DDD (degenerative disc disease), lumbar   . Hypercholesterolemia   . Frequent PVCs   . Alzheimer disease   . Spinal stenosis     lumbar bulging discs   Past Surgical History  Procedure Laterality Date  . Tubal ligation    . Interstitial cystitis      surgery for this  . Foot surgery      bilateral feet surgery on the bone  . Implantation / placement epidural neurostimulator electrodes     History reviewed. No pertinent family history. History  Substance Use Topics  . Smoking status: Never Smoker   . Smokeless tobacco: Never Used  . Alcohol Use: No   OB History   Grav Para Term Preterm Abortions TAB SAB Ect Mult Living                 Review of Systems  Unable to perform ROS: Dementia    Allergies  Atenolol; Other; and Penicillins  Home Medications   Current Outpatient Rx  Name  Route  Sig  Dispense  Refill  . amLODipine (NORVASC) 5 MG tablet   Oral   Take 1 tablet (5 mg total) by mouth daily.   30 tablet   0   . aspirin 81 MG chewable tablet   Oral   Chew 81 mg by  mouth daily.           Marland Kitchen buPROPion (WELLBUTRIN SR) 100 MG 12 hr tablet   Oral   Take 100 mg by mouth 2 (two) times daily.           Marland Kitchen levothyroxine (SYNTHROID, LEVOTHROID) 50 MCG tablet   Oral   Take 50 mcg by mouth daily.           Marland Kitchen lisinopril (PRINIVIL,ZESTRIL) 5 MG tablet   Oral   Take 5 mg by mouth daily.         . Multiple Vitamins-Minerals (MULTIVITAMIN WITH MINERALS) tablet   Oral   Take 1 tablet by mouth daily.         . risperiDONE (RISPERDAL) 0.5 MG tablet   Oral   Take 1 tablet (0.5 mg total) by mouth at bedtime.   30 tablet   0    BP 104/72  Pulse 77  Temp(Src) 98.7 F (37.1 C) (Oral)  Resp 16  SpO2 100% Physical Exam  Nursing note and vitals reviewed. Constitutional: She appears well-developed.  HENT:  Head: Normocephalic and atraumatic.  Eyes: Conjunctivae  are normal.  Neck: Normal range of motion. Neck supple.  Cardiovascular: Normal rate.   Pulmonary/Chest: Effort normal and breath sounds normal. No stridor. No respiratory distress. She has no wheezes.  Abdominal: Soft.    ED Course  Procedures (including critical care time) Labs Review Labs Reviewed - No data to display Imaging Review Dg Chest 2 View  06/25/2013   CLINICAL DATA:  Altered mental status. Possible chicken bone ingestion.  EXAM: CHEST  2 VIEW  COMPARISON:  04/05/2013  FINDINGS: Normal heart size and pulmonary vascularity. Lungs appear clear and expanded. No focal consolidation or airspace disease. No radiopaque foreign body is identified. Prominent degenerative changes in the left shoulder.  IMPRESSION: No evidence of active pulmonary disease. No radiopaque foreign bodies demonstrated.   Electronically Signed   By: Burman Nieves M.D.   On: 06/25/2013 02:21    EKG Interpretation   None       MDM  No diagnosis found.  Pt comes in with cc of "choking on chicken bone."  Pt's exam is completely normal. Vitals are normal. No crepitus, no dysphagia, speaking well,  no stridor, wheezing. CXR unremarkable.  We have been unable to locate family, for patient to be picked up. SW to look into this this morning.   Derwood Kaplan, MD 06/25/13 [phone]

## 2013-06-25 NOTE — Clinical Social Work Note (Signed)
Clinical Social Worker received call from case manager to inform of difficulties in reaching patient family to provide transport at discharge.  CSW has made several attempts to reach patient daughter [name]) on her cell phone number listed on chart and home number located in white pages [phone]).  CSW spoke with [employer] who states that no one was at patient residence at 5:00 when initial request was placed - CSW has requested follow up visit and awaiting return call from [employer].    Patient was a prior resident at [facility], however CSW spoke with facility who states that no patient information has been kept since patient departure.  CSW remains available and will provide continued support in contacting patient family.  Macario Golds, [place] [phone]

## 2013-06-25 NOTE — Progress Notes (Signed)
Spoke with Fleet Contras re patient.Per patients RN they have left messages for patients daughter regarding patients discharge plan but have had no return call.

## 2013-06-25 NOTE — ED Notes (Signed)
Attempted to call patient daughter Tresa Endo, on her cell phone, left a message to find location to be discharge, doctor noted that patient was up for discharge. No call has been returned back to the hospital.

## 2013-06-25 NOTE — ED Notes (Signed)
Social worker called and reported plan to follow up with family .

## 2013-06-25 NOTE — ED Notes (Signed)
Patient choke on chicken at home wiltness by family members brought to er

## 2013-06-25 NOTE — ED Notes (Signed)
Efforts were again to try to get in contact with patient daugher using her cell phone, as well as police department tried to notified daughter that patient could be discharge, confirming that last current address was valid.

## 2013-06-25 NOTE — Clinical Social Work Note (Signed)
Clinical Social Worker spoke with patient Kellie King, over the phone who states that she is home but does not have transportation to come and get patient.  Patient is not appropriate to ride on a bus or in a taxi.  Patient daughter assured CSW that she would be present when ambulance arrived.  Patient daughter confirmed address in EPIC was correct.  CSW arranged ambulance transport via [facility] to patient home.  Clinical Social Worker will sign off for now as social work intervention is no longer needed. Please consult [place] again if new need arises.  Macario Golds, [place] [phone]

## 2013-06-25 NOTE — ED Notes (Signed)
[facility] arrived to transport PT to Daughters house.

## 2013-06-25 NOTE — ED Notes (Addendum)
Social Worker Verdon Cummins called to report Daughter was home and Sharin Mons will transport Pt home.

## 2013-06-30 ENCOUNTER — Inpatient Hospital Stay (HOSPITAL_COMMUNITY)
Admission: EM | Admit: 2013-06-30 | Discharge: 2013-08-02 | DRG: 208 | Disposition: E | Payer: Medicare Other | Attending: Critical Care Medicine | Admitting: Critical Care Medicine

## 2013-06-30 ENCOUNTER — Encounter (HOSPITAL_COMMUNITY): Admission: EM | Disposition: E | Payer: Self-pay | Source: Home / Self Care | Attending: Critical Care Medicine

## 2013-06-30 ENCOUNTER — Encounter (HOSPITAL_COMMUNITY): Payer: Medicare Other | Admitting: Anesthesiology

## 2013-06-30 ENCOUNTER — Emergency Department (HOSPITAL_COMMUNITY): Payer: Medicare Other

## 2013-06-30 ENCOUNTER — Inpatient Hospital Stay (HOSPITAL_COMMUNITY): Payer: Medicare Other | Admitting: Anesthesiology

## 2013-06-30 ENCOUNTER — Inpatient Hospital Stay (HOSPITAL_COMMUNITY): Payer: Medicare Other

## 2013-06-30 ENCOUNTER — Encounter (HOSPITAL_COMMUNITY): Payer: Self-pay | Admitting: Emergency Medicine

## 2013-06-30 DIAGNOSIS — E039 Hypothyroidism, unspecified: Secondary | ICD-10-CM | POA: Diagnosis present

## 2013-06-30 DIAGNOSIS — G309 Alzheimer's disease, unspecified: Secondary | ICD-10-CM | POA: Diagnosis present

## 2013-06-30 DIAGNOSIS — R739 Hyperglycemia, unspecified: Secondary | ICD-10-CM

## 2013-06-30 DIAGNOSIS — E119 Type 2 diabetes mellitus without complications: Secondary | ICD-10-CM | POA: Diagnosis present

## 2013-06-30 DIAGNOSIS — R1310 Dysphagia, unspecified: Secondary | ICD-10-CM | POA: Diagnosis present

## 2013-06-30 DIAGNOSIS — T17900D Unspecified foreign body in respiratory tract, part unspecified causing asphyxiation, subsequent encounter: Secondary | ICD-10-CM

## 2013-06-30 DIAGNOSIS — F259 Schizoaffective disorder, unspecified: Secondary | ICD-10-CM | POA: Diagnosis present

## 2013-06-30 DIAGNOSIS — N179 Acute kidney failure, unspecified: Secondary | ICD-10-CM

## 2013-06-30 DIAGNOSIS — F0280 Dementia in other diseases classified elsewhere without behavioral disturbance: Secondary | ICD-10-CM | POA: Diagnosis present

## 2013-06-30 DIAGNOSIS — T17900A Unspecified foreign body in respiratory tract, part unspecified causing asphyxiation, initial encounter: Secondary | ICD-10-CM

## 2013-06-30 DIAGNOSIS — J9620 Acute and chronic respiratory failure, unspecified whether with hypoxia or hypercapnia: Secondary | ICD-10-CM

## 2013-06-30 DIAGNOSIS — Z8673 Personal history of transient ischemic attack (TIA), and cerebral infarction without residual deficits: Secondary | ICD-10-CM

## 2013-06-30 DIAGNOSIS — R627 Adult failure to thrive: Secondary | ICD-10-CM

## 2013-06-30 DIAGNOSIS — N3010 Interstitial cystitis (chronic) without hematuria: Secondary | ICD-10-CM | POA: Diagnosis present

## 2013-06-30 DIAGNOSIS — J9600 Acute respiratory failure, unspecified whether with hypoxia or hypercapnia: Secondary | ICD-10-CM | POA: Diagnosis present

## 2013-06-30 DIAGNOSIS — IMO0002 Reserved for concepts with insufficient information to code with codable children: Secondary | ICD-10-CM | POA: Diagnosis present

## 2013-06-30 DIAGNOSIS — Z515 Encounter for palliative care: Secondary | ICD-10-CM

## 2013-06-30 DIAGNOSIS — T17308A Unspecified foreign body in larynx causing other injury, initial encounter: Secondary | ICD-10-CM

## 2013-06-30 DIAGNOSIS — R4182 Altered mental status, unspecified: Secondary | ICD-10-CM

## 2013-06-30 DIAGNOSIS — I4891 Unspecified atrial fibrillation: Secondary | ICD-10-CM | POA: Diagnosis not present

## 2013-06-30 DIAGNOSIS — I10 Essential (primary) hypertension: Secondary | ICD-10-CM | POA: Diagnosis present

## 2013-06-30 DIAGNOSIS — Z66 Do not resuscitate: Secondary | ICD-10-CM | POA: Diagnosis not present

## 2013-06-30 DIAGNOSIS — F0390 Unspecified dementia without behavioral disturbance: Secondary | ICD-10-CM

## 2013-06-30 DIAGNOSIS — F411 Generalized anxiety disorder: Secondary | ICD-10-CM | POA: Diagnosis present

## 2013-06-30 DIAGNOSIS — I469 Cardiac arrest, cause unspecified: Secondary | ICD-10-CM

## 2013-06-30 DIAGNOSIS — R092 Respiratory arrest: Secondary | ICD-10-CM

## 2013-06-30 DIAGNOSIS — T17508A Unspecified foreign body in bronchus causing other injury, initial encounter: Principal | ICD-10-CM | POA: Diagnosis present

## 2013-06-30 DIAGNOSIS — T17908A Unspecified foreign body in respiratory tract, part unspecified causing other injury, initial encounter: Secondary | ICD-10-CM

## 2013-06-30 DIAGNOSIS — T17908D Unspecified foreign body in respiratory tract, part unspecified causing other injury, subsequent encounter: Secondary | ICD-10-CM

## 2013-06-30 DIAGNOSIS — J690 Pneumonitis due to inhalation of food and vomit: Secondary | ICD-10-CM | POA: Diagnosis present

## 2013-06-30 DIAGNOSIS — Z7982 Long term (current) use of aspirin: Secondary | ICD-10-CM

## 2013-06-30 DIAGNOSIS — K7200 Acute and subacute hepatic failure without coma: Secondary | ICD-10-CM | POA: Diagnosis present

## 2013-06-30 DIAGNOSIS — G931 Anoxic brain damage, not elsewhere classified: Secondary | ICD-10-CM | POA: Diagnosis present

## 2013-06-30 HISTORY — PX: FLEXIBLE BRONCHOSCOPY: SHX5094

## 2013-06-30 LAB — URINALYSIS, ROUTINE W REFLEX MICROSCOPIC
Glucose, UA: 500 mg/dL — AB
Nitrite: NEGATIVE
Protein, ur: 100 mg/dL — AB
Specific Gravity, Urine: 1.01 (ref 1.005–1.030)
Urobilinogen, UA: 0.2 mg/dL (ref 0.0–1.0)

## 2013-06-30 LAB — COMPREHENSIVE METABOLIC PANEL
ALT: 9 U/L (ref 0–35)
AST: 19 U/L (ref 0–37)
Albumin: 2.8 g/dL — ABNORMAL LOW (ref 3.5–5.2)
CO2: 19 mEq/L (ref 19–32)
Chloride: 102 mEq/L (ref 96–112)
Creatinine, Ser: 0.64 mg/dL (ref 0.50–1.10)
GFR calc non Af Amer: 86 mL/min — ABNORMAL LOW (ref >90)
Sodium: 141 mEq/L (ref 135–145)
Total Bilirubin: 0.4 mg/dL (ref 0.3–1.2)

## 2013-06-30 LAB — CBC WITH DIFFERENTIAL/PLATELET
Basophils Absolute: 0 10*3/uL (ref 0.0–0.1)
Basophils Relative: 0 % (ref 0–1)
HCT: 35.8 % — ABNORMAL LOW (ref 36.0–46.0)
Lymphocytes Relative: 37 % (ref 12–46)
MCHC: 32.7 g/dL (ref 30.0–36.0)
Monocytes Absolute: 0.4 10*3/uL (ref 0.1–1.0)
Neutro Abs: 4.4 10*3/uL (ref 1.7–7.7)
Neutrophils Relative %: 57 % (ref 43–77)
Platelets: 211 10*3/uL (ref 150–400)
RDW: 13.1 % (ref 11.5–15.5)
WBC: 7.7 10*3/uL (ref 4.0–10.5)

## 2013-06-30 LAB — POCT I-STAT 3, ART BLOOD GAS (G3+)
O2 Saturation: 100 %
Patient temperature: 31.8
TCO2: 23 mmol/L (ref 0–100)
pH, Arterial: 7.136 — CL (ref 7.350–7.450)

## 2013-06-30 LAB — POCT I-STAT, CHEM 8
BUN: 19 mg/dL (ref 6–23)
HCT: 36 % (ref 36.0–46.0)
Hemoglobin: 12.2 g/dL (ref 12.0–15.0)
Sodium: 142 mEq/L (ref 135–145)
TCO2: 19 mmol/L (ref 0–100)

## 2013-06-30 LAB — POCT I-STAT TROPONIN I: Troponin i, poc: 0.02 ng/mL (ref 0.00–0.08)

## 2013-06-30 LAB — CG4 I-STAT (LACTIC ACID): Lactic Acid, Venous: 10.36 mmol/L — ABNORMAL HIGH (ref 0.5–2.2)

## 2013-06-30 SURGERY — BRONCHOSCOPY, FLEXIBLE
Anesthesia: General

## 2013-06-30 MED ORDER — DEXTROSE 5 % IV SOLN
4000.0000 ug | INTRAVENOUS | Status: DC | PRN
  Administered 2013-06-30: 5 ug/min via INTRAVENOUS

## 2013-06-30 MED ORDER — SODIUM BICARBONATE 8.4 % IV SOLN
50.0000 meq | Freq: Once | INTRAVENOUS | Status: AC
  Administered 2013-06-30: 50 meq via INTRAVENOUS
  Filled 2013-06-30: qty 50

## 2013-06-30 MED ORDER — 0.9 % SODIUM CHLORIDE (POUR BTL) OPTIME
TOPICAL | Status: DC | PRN
  Administered 2013-06-30: 1000 mL

## 2013-06-30 MED ORDER — FENTANYL CITRATE 0.05 MG/ML IJ SOLN: 100.0000 ug | Freq: Once | INTRAMUSCULAR | Status: AC | PRN

## 2013-06-30 MED ORDER — SODIUM CHLORIDE 0.9 % IV BOLUS (SEPSIS)
1000.0000 mL | Freq: Once | INTRAVENOUS | Status: AC
  Administered 2013-06-30: 1000 mL via INTRAVENOUS

## 2013-06-30 MED ORDER — ASPIRIN 300 MG RE SUPP
300.0000 mg | RECTAL | Status: AC
  Administered 2013-07-01: 300 mg via RECTAL
  Filled 2013-06-30: qty 1

## 2013-06-30 MED ORDER — ASPIRIN 300 MG RE SUPP: 300.0000 mg | RECTAL | Status: DC

## 2013-06-30 MED ORDER — NOREPINEPHRINE BITARTRATE 1 MG/ML IJ SOLN: 2.0000 ug/min | INTRAVENOUS | Status: DC

## 2013-06-30 MED ORDER — MIDAZOLAM HCL 2 MG/2ML IJ SOLN
2.0000 mg | INTRAMUSCULAR | Status: DC | PRN
  Administered 2013-06-30 (×2): 2 mg via INTRAVENOUS

## 2013-06-30 MED ORDER — SODIUM CHLORIDE 0.9 % IV SOLN
INTRAVENOUS | Status: DC | PRN
  Administered 2013-06-30: 22:00:00 via INTRAVENOUS

## 2013-06-30 MED ORDER — VECURONIUM BROMIDE 10 MG IV SOLR
INTRAVENOUS | Status: DC | PRN
  Administered 2013-06-30: 6 mg via INTRAVENOUS

## 2013-06-30 MED ORDER — FENTANYL BOLUS VIA INFUSION
50.0000 ug | INTRAVENOUS | Status: DC | PRN
  Filled 2013-06-30: qty 50

## 2013-06-30 MED ORDER — NOREPINEPHRINE BITARTRATE 1 MG/ML IJ SOLN
2.0000 ug/min | Freq: Once | INTRAVENOUS | Status: AC
  Administered 2013-06-30: 10 ug/min via INTRAVENOUS
  Filled 2013-06-30 (×2): qty 4

## 2013-06-30 MED ORDER — FENTANYL CITRATE 0.05 MG/ML IJ SOLN
INTRAMUSCULAR | Status: DC | PRN
  Administered 2013-06-30 (×2): 50 ug via INTRAVENOUS

## 2013-06-30 MED ORDER — ARTIFICIAL TEARS OP OINT
TOPICAL_OINTMENT | OPHTHALMIC | Status: DC | PRN
  Administered 2013-06-30: 1 via OPHTHALMIC

## 2013-06-30 MED ORDER — ARTIFICIAL TEARS OP OINT
1.0000 "application " | TOPICAL_OINTMENT | Freq: Three times a day (TID) | OPHTHALMIC | Status: DC
  Administered 2013-07-01 – 2013-07-02 (×4): 1 via OPHTHALMIC
  Filled 2013-06-30: qty 3.5

## 2013-06-30 MED ORDER — SODIUM CHLORIDE 0.9 % IV SOLN
25.0000 ug/h | INTRAVENOUS | Status: DC
  Administered 2013-07-01: 25 ug/h via INTRAVENOUS
  Filled 2013-06-30: qty 50

## 2013-06-30 MED ORDER — DEXTROSE 5 % IV SOLN
0.5000 ug/min | INTRAVENOUS | Status: DC
  Administered 2013-07-01: 3 ug/min via INTRAVENOUS
  Filled 2013-06-30: qty 4

## 2013-06-30 MED ORDER — CISATRACURIUM BOLUS VIA INFUSION
0.0500 mg/kg | INTRAVENOUS | Status: DC | PRN
  Filled 2013-06-30: qty 3

## 2013-06-30 MED ORDER — PROPOFOL 10 MG/ML IV EMUL
5.0000 ug/kg/min | INTRAVENOUS | Status: DC
  Administered 2013-07-01: 5 ug/kg/min via INTRAVENOUS
  Filled 2013-06-30 (×3): qty 100

## 2013-06-30 MED ORDER — SODIUM CHLORIDE 0.9 % IV SOLN
250.0000 mL | INTRAVENOUS | Status: DC | PRN
  Administered 2013-07-04: 15:00:00 via INTRAVENOUS
  Administered 2013-07-07: 250 mL via INTRAVENOUS

## 2013-06-30 MED ORDER — SODIUM CHLORIDE 0.9 % IV SOLN: 2000.0000 mL | Freq: Once | INTRAVENOUS | Status: DC

## 2013-06-30 MED ORDER — SODIUM CHLORIDE 0.9 % IV SOLN
40.0000 ug/h | INTRAVENOUS | Status: DC
  Administered 2013-06-30: 50 ug/h via INTRAVENOUS
  Filled 2013-06-30 (×2): qty 50

## 2013-06-30 MED ORDER — HEPARIN SODIUM (PORCINE) 5000 UNIT/ML IJ SOLN: 5000.0000 [IU] | Freq: Three times a day (TID) | INTRAMUSCULAR | Status: DC

## 2013-06-30 MED ORDER — VANCOMYCIN HCL IN DEXTROSE 1-5 GM/200ML-% IV SOLN
1000.0000 mg | Freq: Once | INTRAVENOUS | Status: AC
  Administered 2013-06-30: 1000 mg via INTRAVENOUS
  Filled 2013-06-30: qty 200

## 2013-06-30 MED ORDER — MIDAZOLAM HCL 2 MG/2ML IJ SOLN
INTRAMUSCULAR | Status: AC
  Administered 2013-06-30: 2 mg via INTRAVENOUS
  Filled 2013-06-30: qty 4

## 2013-06-30 MED ORDER — CISATRACURIUM BESYLATE 10 MG/ML IV SOLN
1.0000 ug/kg/min | INTRAVENOUS | Status: DC
  Administered 2013-07-01: 1 ug/kg/min via INTRAVENOUS
  Filled 2013-06-30: qty 20

## 2013-06-30 MED ORDER — FENTANYL CITRATE 0.05 MG/ML IJ SOLN: 100.0000 ug | Freq: Once | INTRAMUSCULAR | Status: DC

## 2013-06-30 MED ORDER — ASPIRIN 81 MG PO CHEW: 324.0000 mg | CHEWABLE_TABLET | ORAL | Status: DC

## 2013-06-30 MED ORDER — HEPARIN SODIUM (PORCINE) 5000 UNIT/ML IJ SOLN
5000.0000 [IU] | Freq: Two times a day (BID) | INTRAMUSCULAR | Status: DC
  Administered 2013-07-01 – 2013-07-05 (×10): 5000 [IU] via SUBCUTANEOUS
  Filled 2013-06-30 (×12): qty 1

## 2013-06-30 MED ORDER — CISATRACURIUM BOLUS VIA INFUSION
0.1000 mg/kg | Freq: Once | INTRAVENOUS | Status: AC
  Administered 2013-07-01: 4.9 mg via INTRAVENOUS
  Filled 2013-06-30: qty 5

## 2013-06-30 SURGICAL SUPPLY — 37 items
BRUSH SUPERTRAX BIOPSY (INSTRUMENTS) IMPLANT
BRUSH SUPERTRAX NDL-TIP CYTO (INSTRUMENTS) IMPLANT
CANISTER SUCTION 2500CC (MISCELLANEOUS) ×3 IMPLANT
CHANNEL WORK EXTEND EDGE 180 (KITS) IMPLANT
CHANNEL WORK EXTEND EDGE 45 (KITS) IMPLANT
CHANNEL WORK EXTEND EDGE 90 (KITS) IMPLANT
CONT SPEC 4OZ CLIKSEAL STRL BL (MISCELLANEOUS) ×6 IMPLANT
COVER TABLE BACK 60X90 (DRAPES) ×3 IMPLANT
DRSG AQUACEL AG ADV 3.5X14 (GAUZE/BANDAGES/DRESSINGS) IMPLANT
FILTER STRAW FLUID ASPIR (MISCELLANEOUS) IMPLANT
FORCEPS BIOP SUPERTRX PREMAR (INSTRUMENTS) IMPLANT
FORCEPS RADIAL JAW LRG 4 PULM (INSTRUMENTS) ×1 IMPLANT
GLOVE BIO SURGEON STRL SZ7.5 (GLOVE) ×3 IMPLANT
GOWN PREVENTION PLUS XLARGE (GOWN DISPOSABLE) ×3 IMPLANT
KIT PROCEDURE EDGE 180 (KITS) IMPLANT
KIT PROCEDURE EDGE 45 (KITS) IMPLANT
KIT PROCEDURE EDGE 90 (KITS) IMPLANT
KIT ROOM TURNOVER OR (KITS) ×3 IMPLANT
MARKER SKIN DUAL TIP RULER LAB (MISCELLANEOUS) ×3 IMPLANT
NEEDLE SUPERTRX PREMARK BIOPSY (NEEDLE) IMPLANT
NS IRRIG 1000ML POUR BTL (IV SOLUTION) ×3 IMPLANT
OIL SILICONE PENTAX (PARTS (SERVICE/REPAIRS)) ×3 IMPLANT
PAD ARMBOARD 7.5X6 YLW CONV (MISCELLANEOUS) ×6 IMPLANT
PATCHES PATIENT (LABEL) ×9 IMPLANT
RADIAL JAW LRG 4 PULMONARY (INSTRUMENTS) ×2
RAPTOR GRASPING DEVICE ×3 IMPLANT
SNARE SHORT THROW 13M SML OVAL (MISCELLANEOUS) ×3 IMPLANT
SPONGE GAUZE 4X4 12PLY (GAUZE/BANDAGES/DRESSINGS) ×3 IMPLANT
SYR 20CC LL (SYRINGE) ×3 IMPLANT
SYR 20ML ECCENTRIC (SYRINGE) ×3 IMPLANT
SYR 30ML LL (SYRINGE) ×3 IMPLANT
SYR 50ML SLIP (SYRINGE) ×3 IMPLANT
TALON GRASPING DEVICE ×3 IMPLANT
TOWEL OR 17X24 6PK STRL BLUE (TOWEL DISPOSABLE) ×3 IMPLANT
TRAP SPECIMEN MUCOUS 40CC (MISCELLANEOUS) ×3 IMPLANT
TUBE CONNECTING 12'X1/4 (SUCTIONS) ×1
TUBE CONNECTING 12X1/4 (SUCTIONS) ×2 IMPLANT

## 2013-06-30 NOTE — Anesthesia Postprocedure Evaluation (Signed)
  Anesthesia Post-op Note  Patient: Kellie King  Procedure(s) Performed: Procedure(s): RIGID BRONCHOSCOPY (N/A) FLEXIBLE BRONCHOSCOPY (N/A)  Patient Location: ICU  Anesthesia Type:General  Level of Consciousness: Patient remains intubated per anesthesia plan  Airway and Oxygen Therapy: Patient remains intubated per anesthesia plan and Patient placed on Ventilator (see vital sign flow sheet for setting)  Post-op Pain: none  Post-op Assessment: Post-op Vital signs reviewed, Patient's Cardiovascular Status Stable, Respiratory Function Stable, Patent Airway, No signs of Nausea or vomiting and Pain level controlled  Post-op Vital Signs: Reviewed and stable  Complications: No apparent anesthesia complications

## 2013-06-30 NOTE — Progress Notes (Signed)
The patient was examined and preop studies reviewed. There has been no change from the prior exam and the patient is ready for surgery.   Plan bronchoscopy to clear airway of food/ foreign body under general anesthesia.

## 2013-06-30 NOTE — Procedures (Signed)
Intubation Procedure Note Kellie King [id number] 1941-05-21  Procedure: Intubation Indications: ETT needed to be changed  Procedure Details Consent: Unable to obtain consent because of emergent medical necessity. Time Out: Verified patient identification, verified procedure, site/side was marked, verified correct patient position, special equipment/implants available, medications/allergies/relevent history reviewed, required imaging and test results available.  Performed  Maximum sterile technique was used including gloves, gown, hand hygiene and mask.  MAC    Evaluation Hemodynamic Status: BP stable throughout; O2 sats: stable throughout Patient's Current Condition: stable Complications: No apparent complications Patient did tolerate procedure well. Chest X-ray ordered to verify placement.  CXR: pending.  [name]    [place], Kellie King 06/20/2013

## 2013-06-30 NOTE — ED Provider Notes (Signed)
CSN: [id number]     Arrival date & time 07-01-13  1912 History   First MD Initiated Contact with Patient 07-01-13 1924     Chief Complaint  Patient presents with  . Jartavious Mckimmy CPR    (Consider location/radiation/quality/duration/timing/severity/associated sxs/prior Treatment) HPI  Chief complaint: CPR   This is a 72 year old female past medical history significant for CVA, dementia, schizoaffective, diabetes he comes today Darchelle Nunes CPR. Limited details are available at this time. Per EMS daughter had given patient a hamburger. When the daughter returned several minutes later noted patient had choked, had emesis and not responsive. CPR was initiated and EMS called. On EMS arrival patient is apneic pulseless and CPR was continued. Total 20 minutes of CPR was initiated with 2 rounds of epinephrine. ROSC obtained and transported to [facility]. No further details are available secondary to patient's condition and family not at bedside  Past Medical History  Diagnosis Date  . CVA (cerebral vascular accident)     right  . Dementia   . Schizo affective schizophrenia   . Hypertension   . Thyroid disease   . Diabetes mellitus   . Anxiety   . Interstitial cystitis     has stimulator in her back  . Allergic rhinitis   . DDD (degenerative disc disease), lumbar   . Hypercholesterolemia   . Frequent PVCs   . Alzheimer disease   . Spinal stenosis     lumbar bulging discs   Past Surgical History  Procedure Laterality Date  . Tubal ligation    . Interstitial cystitis      surgery for this  . Foot surgery      bilateral feet surgery on the bone  . Implantation / placement epidural neurostimulator electrodes     History reviewed. No pertinent family history. History  Substance Use Topics  . Smoking status: Never Smoker   . Smokeless tobacco: Never Used  . Alcohol Use: No   OB History   Grav Para Term Preterm Abortions TAB SAB Ect Mult Living                 Review of Systems  Unable to perform  ROS: Acuity of condition    Allergies  Atenolol; Other; and Penicillins  Home Medications   No current outpatient prescriptions on file. BP 114/48  Pulse 89  Temp(Src) 91 F (32.8 C)  Resp 18  SpO2 100% Physical Exam  Vitals reviewed. Constitutional: She appears distressed.  HENT:  Head: Normocephalic and atraumatic.  ET tube in place. Emesis noted. Food particles in ET tube  Eyes: Pupils are equal, round, and reactive to light.  Cardiovascular: Regular rhythm and intact distal pulses.   Bradycardia   Pulmonary/Chest: No stridor. She is in respiratory distress (intubated).  Patient intubated with bilateral breath sounds. ET tube in place  Abdominal: Soft. She exhibits no distension. There is no tenderness. There is no rebound and no guarding.  Musculoskeletal: She exhibits no edema.  Neurological:  GCS 3. Unable to fully assess neuro status secondary to unresponsiveness. Grossly no neuro focal deficit found  Skin: Skin is warm and dry. No rash noted.  Psychiatric:  Unable to assess. Unresponsive    ED Course  Procedures (including critical care time) Labs Review Labs Reviewed  CBC WITH DIFFERENTIAL - Abnormal; Notable for the following:    RBC 3.79 (*)    Hemoglobin 11.7 (*)    HCT 35.8 (*)    All other components within normal limits  COMPREHENSIVE METABOLIC PANEL -  Abnormal; Notable for the following:    Potassium 3.3 (*)    Glucose, Bld 272 (*)    Total Protein 5.5 (*)    Albumin 2.8 (*)    GFR calc non Af Amer 86 (*)    All other components within normal limits  URINALYSIS, ROUTINE W REFLEX MICROSCOPIC - Abnormal; Notable for the following:    APPearance CLOUDY (*)    Glucose, UA 500 (*)    Hgb urine dipstick MODERATE (*)    Protein, ur 100 (*)    Leukocytes, UA TRACE (*)    All other components within normal limits  POCT I-STAT, CHEM 8 - Abnormal; Notable for the following:    Potassium 3.2 (*)    Glucose, Bld 275 (*)    All other components within  normal limits  CG4 I-STAT (LACTIC ACID) - Abnormal; Notable for the following:    Lactic Acid, Venous 10.36 (*)    All other components within normal limits  POCT I-STAT 3, BLOOD GAS (G3+) - Abnormal; Notable for the following:    pH, Arterial 7.136 (*)    pCO2 arterial 57.8 (*)    pO2, Arterial 276.0 (*)    Acid-base deficit 9.0 (*)    All other components within normal limits  CULTURE, BLOOD (ROUTINE X 2)  CULTURE, BLOOD (ROUTINE X 2)  URINE MICROSCOPIC-ADD ON  BLOOD GAS, ARTERIAL  BASIC METABOLIC PANEL  BASIC METABOLIC PANEL  BASIC METABOLIC PANEL  BASIC METABOLIC PANEL  BASIC METABOLIC PANEL  BASIC METABOLIC PANEL  BASIC METABOLIC PANEL  PROTIME-INR  PROTIME-INR  APTT  APTT  CBC  POCT I-STAT TROPONIN I   Imaging Review Ct Head Wo Contrast  07/27/13   CLINICAL DATA:  Patient aspirated cheeseburger. Patient had cardiac arrest with question of anoxic injury. Foreign body removal.  EXAM: CT HEAD WITHOUT CONTRAST  TECHNIQUE: Contiguous axial images were obtained from the base of the skull through the vertex without intravenous contrast.  COMPARISON:  04/05/2013  FINDINGS: Diffuse cerebral atrophy. Ventricular dilatation consistent with central atrophy. Old bilateral cerebellar infarcts. Low-attenuation changes in the deep white matter consistent with small vessel ischemia. Old lacunar infarcts in the thalamus bilaterally and basal ganglia. Low-attenuation focus in the right pons and midbrain consistent with old ischemia, stable since previous study. No mass effect or midline shift. No abnormal extra-axial fluid collections. Move no evidence of acute intracranial hemorrhage. No depressed skull fractures. Retention cysts in the ethmoid air cells. No air-fluid levels in the paranasal sinuses. Mastoid air cells are not opacified. Vascular calcifications.  IMPRESSION: No evidence of acute intracranial abnormality. Diffuse atrophy, small vessel ischemic changes, old cerebellar infarcts, and  old lacune or infarcts, including lacunar infarct in the right brainstem.   Electronically Signed   By: Burman Nieves M.D.   On: Jul 27, 2013 22:18   Dg Chest Portable 1 View  27-Jul-2013   CLINICAL DATA:  Aspiration.  EXAM: PORTABLE CHEST - 1 VIEW  COMPARISON:  2013/07/27 at 2024 hr  FINDINGS: Endotracheal tube tip measures about 2.5 cm above the carinal. Enteric tube is present. The tip is out of the field of view. The proximal side hole is located about the EG junction region. Normal heart size and pulmonary vascularity. Hazy parenchymal infiltration in the right upper lung consistent with history of aspiration. No pleural effusion. No visible pneumothorax. Pneumomediastinum seen on the previous study is suggested in the periaortic region but is less well seen than on the previous study. The neck base is not  included on the current study.  IMPRESSION: The proximal side hole of the enteric tube is located at the EG junction region. This may need to be advanced. Hazy infiltrates in the right upper lung compatible with history of aspiration. Pneumomediastinum is again demonstrated but less prominent than on the previous study.   Electronically Signed   By: Burman Nieves M.D.   On: 06/28/2013 21:33   Dg Chest Portable 1 View  06/02/2013   CLINICAL DATA:  72 year old female with respiratory difficulty.  EXAM: PORTABLE CHEST - 1 VIEW  COMPARISON:  06/12/2013 and prior chest radiographs  FINDINGS: An endotracheal tube is identified with tip 4.1 cm above carina.  An NG tube is present entering the stomach with tip off the field of view.  Pneumomediastinum is now identified extending into the right neck.  There is no evidence of pneumothorax, airspace disease or definite pleural effusion.  Defibrillator pads overlying the left chest are noted.  IMPRESSION: Endotracheal tube with tip 4.1 cm above the carina.  Pneumomediastinum now identified without evidence of pneumothorax.  NG tube as described.    Electronically Signed   By: Laveda Abbe M.D.   On: 06/16/2013 20:53   Dg Chest Portable 1 View  06/28/2013   CLINICAL DATA:  Respiratory difficult  EXAM: PORTABLE CHEST - 1 VIEW  COMPARISON:  06/25/2013  FINDINGS: Endotracheal tube tip is 7.5 cm from the carinal. NG tube tip is beyond the gastroesophageal junction. Lungs are hyper aerated. There are grossly clear. No pneumothorax.  IMPRESSION: Hyperaeration.  Endotracheal and NG tubes as described.   Electronically Signed   By: Maryclare Bean M.D.   On: 06/10/2013 19:58    EKG Interpretation    Date/Time:  Saturday June 30 2013 19:17:53 EST Ventricular Rate:  57 PR Interval:  120 QRS Duration: 121 QT Interval:  473 QTC Calculation: 461 R Axis:   -64 Text Interpretation:  Age not entered, assumed to be  72 years old for purpose of ECG interpretation Sinus rhythm Left bundle branch block Similar to prior Confirmed by [facility]  MD, BLAIR ([id number]) on 06/29/2013 7:26:34 PM            MDM   1. Respiratory failure, acute   2. Cardiac arrest     72 year old female Darianne Muralles arrest. On arrival to emergency department patient did have airway in place, bilateral breath sounds. Patient does have strong carotid and femoral pulses. IV access was established and cooling protocol continued. Basic labs, EKG and imaging ordered. Critical care consult. On emergency department patient had episodes of increasing auto PEEP and air trapping. Secondary this patient had deteriorating blood pressures. He required aggressive respiratory care. Critical care attending was called in for bronchoscopy to remove foreign bodies airway. Additionally thoracic ultrasounds conducted insure patient did not have large pneumothorax developed with auto PEEP. This was negative. Secondary to poor pressures and need for Levophed left femoral line was started. Cardiothoracic surgery consult for rigid bronchoscopy in the OR to remove multiple visualized foreign bodies from airway. Patient  remained stable on leaving fed with decent pressures. Cooling protocol continued. Patient transferred to OR with cardiothoracic surgery. No further acute issues.  Patient discussed with attending Dr. Gwendolyn Grant.      Bridgett Larsson, MD 06/27/2013 [phone]

## 2013-06-30 NOTE — ED Notes (Signed)
Patient transported to CT 

## 2013-06-30 NOTE — ED Notes (Signed)
CC MD at bedside with bronchoscope attempting again to get meat out of airway. MD got a small piece of meat out will continue to ventilate and attempt to pull more meat out of pt's airway. Resident attempting femoral line x 2.

## 2013-06-30 NOTE — ED Notes (Signed)
Resident at bedside attempting to place a femoral central line.

## 2013-06-30 NOTE — ED Provider Notes (Signed)
CENTRAL LINE Date/Time: Jul 01, 2013 11:49 PM Performed by: Tierra Thoma Authorized by: Dagmar Hait Consent: The procedure was performed in an emergent situation. Time out: Immediately prior to procedure a "time out" was called to verify the correct patient, procedure, equipment, support staff and site/side marked as required. Indications: vascular access Preparation: skin prepped with 2% chlorhexidine and skin prepped with povidone-iodine Skin prep agent dried: skin prep agent completely dried prior to procedure Sterile barriers: all five maximum sterile barriers used - cap, mask, sterile gown, sterile gloves, and large sterile sheet Hand hygiene: hand hygiene performed prior to central venous catheter insertion Location details: left femoral Patient position: flat Catheter type: triple lumen Ultrasound guidance: yes Number of attempts: 1 Successful placement: yes Levelle Edelen-procedure: line sutured Assessment: blood return through all ports Patient tolerance: Patient tolerated the procedure well with no immediate complications.     Bridgett Larsson, MD July 01, 2013 2350

## 2013-06-30 NOTE — ED Notes (Signed)
CC MD at bedside attempting to remove more meat from airway with bronchoscope.

## 2013-06-30 NOTE — Progress Notes (Signed)
Day of Surgery Procedure(s) (LRB): RIGID BRONCHOSCOPY (N/A) FLEXIBLE BRONCHOSCOPY (N/A) Subjective: 72 year old Caucasian female with significant dementia as well as significant dysphagia following a left body stroke 3 years ago presented to the ED with EMS intubated after an parents respiratory arrest related to airway obstruction-aspiration of probable hamburger meat. During the evening meal she was found unresponsive by her daughter who started CPR and called 911. EMS arrived and continued CPR and intubated the patient. When she arrived to the ED she had a pulse and was started on norepinephrine drip. She had extremely poor airway compliance with a title volume of less than 100 cc and high airway pressures. Because of the history of aspiration she underwent bronchoscopy by CCM in the ED which confirmed hamburger occluding both mainstem bronchi. Never was clear from the right mainstem bronchus but was unable to be cleared from the left. Thoracic surgical evaluation was requested.  The patient's ventilation significantly improved by the time I arrived with 100% saturation and fairly normal tidal volumes. Initial blood gas PCO2 of 56. The ET tube placed and the field was in place a larger tube-for which the patient received Versed and fentanyl. Her blood pressure has been stable since arrival. She is currently not responsive but was moving and resisting reintubation earlier.  The patient has been started on cooling protocol and is planned to have the [facility] resuscitation therapy in the ICU.  Thoracic surgical input for bronchoscopy in the OR to clear the left mainstem bronchus is planned.  Objective: Vital signs in last 24 hours: Temp:  [91 F (32.8 C)-92.7 F (33.7 C)] 91 F (32.8 C) (11/29 2035) Pulse Rate:  [54-98] 82 (11/29 2115) Cardiac Rhythm:  [-] Normal sinus rhythm (11/29 1913) Resp:  [10-20] 19 (11/29 2115) BP: (51-179)/(34-87) 105/76 mmHg (11/29 2115) SpO2:  [89 %-100 %] 98 %  (11/29 2115)  Hemodynamic parameters for last 24 hours:   respiratory arrest followed by cardiac arrest in the field  Intake/Output from previous day:   Intake/Output this shift:   unknown  General appearance-collar we then Caucasian female intubated unresponsive in the ED- HEENT pupils are small and equal no crepitus in the neck Lungs breath sounds diminished on the left compared to the right Cardiac sinus rhythm no murmur Abdomen scaphoid nontender no mass Extremities thin cool no edema no cyanosis Neuro on vent unresponsive  Lab Results:  Recent Labs  Jul 18, 2013 1921 18-Jul-2013 1931  WBC 7.7  --   HGB 11.7* 12.2  HCT 35.8* 36.0  PLT 211  --    BMET:  Recent Labs  07/18/2013 1921 Jul 18, 2013 1931  NA 141 142  K 3.3* 3.2*  CL 102 109  CO2 19  --   GLUCOSE 272* 275*  BUN 18 19  CREATININE 0.64 0.80  CALCIUM 8.5  --     PT/INR: No results found for this basename: LABPROT, INR,  in the last 72 hours ABG    Component Value Date/Time   PHART 7.136* 18-Jul-2013 2117   HCO3 21.1 Jul 18, 2013 2117   TCO2 23 2013/07/18 2117   ACIDBASEDEF 9.0* 18-Jul-2013 2117   O2SAT 100.0 Jul 18, 2013 2117   CBG (last 3)  No results found for this basename: GLUCAP,  in the last 72 hours  Assessment/Plan: S/P Procedure(s) (LRB): RIGID BRONCHOSCOPY (N/A) FLEXIBLE BRONCHOSCOPY (N/A)  Plan urgent bronchoscopy under anesthesia to clear left mainstem bronchus to optimize ventilation. I discussed the procedure with the patient's daughter Tresa Endo and obtained informed consent. From the OR she  will go to [facility] for [facility] therapy by CCM.   LOS: 0 days    VAN TRIGT III,PETER July 02, 2013

## 2013-06-30 NOTE — ED Notes (Addendum)
Bronchscope indicates that Pt has hamburger meat in both right and left lower lobes per CC MD will continue to ventilate patient per RT.

## 2013-06-30 NOTE — Transfer of Care (Addendum)
Immediate Anesthesia Transfer of Care Note  Patient: Kellie King  Procedure(s) Performed: Procedure(s): RIGID BRONCHOSCOPY (N/A) FLEXIBLE BRONCHOSCOPY (N/A)  Patient Location: 2H02  Anesthesia Type:General  Level of Consciousness: Patient remains intubated per anesthesia plan  Airway & Oxygen Therapy: Patient remains intubated per anesthesia plan and Patient placed on Ventilator (see vital sign flow sheet for setting)  Post-op Assessment: Report given to PACU RN and Post -op Vital signs reviewed and stable  Post vital signs: Reviewed and stable  Complications: No apparent anesthesia complications

## 2013-06-30 NOTE — ED Notes (Signed)
RT, RN and pt in CT. Called Report to OR and they are ready for patient. Pt on monitor, transported from CT to OR.

## 2013-06-30 NOTE — Addendum Note (Signed)
Addendum created 2013/07/15 2352 by Luster Landsberg, CRNA   Modules edited: Anesthesia Medication Administration

## 2013-06-30 NOTE — ED Provider Notes (Signed)
I saw and evaluated the patient, reviewed the resident's note and I agree with the findings and plan.  EKG Interpretation    Date/Time:  Saturday June 30 2013 19:17:53 EST Ventricular Rate:  57 PR Interval:  120 QRS Duration: 121 QT Interval:  473 QTC Calculation: 461 R Axis:   -64 Text Interpretation:  Age not entered, assumed to be  72 years old for purpose of ECG interpretation Sinus rhythm Left bundle branch block Similar to prior Confirmed by [facility]  MD, [place] Inthavong ([id number]) on 06/06/2013 7:26:34 PM           72 year-old female presents post cardiac arrest. Patient's daughter left her with a hamburger and came back to check on her 20 minutes later and she is not breathing. Patient's daughter initiated CPR which per EMS looked very well done. Patient had 20 minutes of CPR with EMS and 2 rounds of epinephrine. The patient was intubated by EMS. Upon arrival vitals had he tries to tolerate in the 60s, blood pressures with systolics in the 80s and 90s. Patient was breathing over the vent. Patient bilateral breath sounds and good capnography. We began assessing are airway, breathing, and circulation. Patient had a left tibial IO. 2 IVs were started by nurses here. Since she had a rest, ice packs and ice normal saline were started for possible hypothermia. Critical care was consult and to help with determining if she needed hypothermia. Critical care arrived and began evaluating patient. Critical care perform bronchoscopy at the bedside after exchanging the ET tube from a 7-7.5. Bronchoscopy shows multiple foreign bodies that appear to be consistent with a hamburger throughout the airway.  Critical care doctor was a pulmonology and critical care fellow who do not have bronchoscopy privileges in the OR at this hospital.  While trying to find someone to perform bronchoscopy here, patient's blood pressure began to slowly drop. Her oxygen saturation also begin to drop also. Her breast sounds are  decreasing. There is concern for ROP been due to one-way valves created by the hamburger meat. Repeat chest x-ray showed increasing hyperexpansion without pneumothorax. Patient's was taken off and allowed to exhale stop breath stacking. Respiratory began vigorously lavaging the patient, getting small bits of meat from the airway . This helped her with her blood pressure because I believe she was decreasing venous return due to overexpansion her lungs from airway obstruction with hamburger. Levophed initiated for low BPs. L femoral central line placed by myself and Dr. Arlie Solomons. I was present at the bedside for the entire procedure and confirmed wire was out of patient at procedure end.  The pulmonary, critical care attending arrived to perform bronchoscopy and was able to remove some pieces of meat. Critical Care contacted CT surgery, then arrived to take patient for rigid bronchoscopy to the OR.  CRITICAL CARE Performed by: Dagmar Hait   Total critical care time: 60 minutes  Critical care time was exclusive of separately billable procedures and treating other patients.  Critical care was necessary to treat or prevent imminent or life-threatening deterioration.  Critical care was time spent personally by me on the following activities: development of treatment plan with patient and/or surrogate as well as nursing, discussions with consultants, evaluation of patient's response to treatment, examination of patient, obtaining history from patient or surrogate, ordering and performing treatments and interventions, ordering and review of laboratory studies, ordering and review of radiographic studies, pulse oximetry and re-evaluation of patient's condition. Care      1. Respiratory  failure, acute  2. Airway obstruction  3. Cardiac arrest    Dagmar Hait, MD 06/24/2013 2351

## 2013-06-30 NOTE — ED Notes (Addendum)
Per EMS: pt daughter fed mother a hamburger and then later went back to check on her mom and found her unresponsive. Daughter started CPR and when EMS arrived pt was still pulses and apnic with hamburger aspiration. EMS cleared hamburger out of airway and started CPR. EMS intubated with a 7.0 tube with emesis. Pt given 2 EPI. Pulses returned sinus brady on monitor. BP initally 87/59 and continued in 90 systolic. EMS started 1 bag of cold saline.

## 2013-06-30 NOTE — Brief Op Note (Signed)
07-05-13  11:24 PM  PATIENT:  Kellie King  72 y.o. female  PRE-OPERATIVE DIAGNOSIS:  aspirated food substance  POST-OPERATIVE DIAGNOSIS:  Aspirated food substance  PROCEDURE:  Video Bronchoscopy0  SURGEON:  Surgeon(s) and Role:    * Kerin Perna, MD - Primary  PHYSICIAN ASSISTANT:   ASSISTANTS: none   ANESTHESIA:   general  EBL:   0  BLOOD ADMINISTERED:none  DRAINS: none   LOCAL MEDICATIONS USED:  NONE  SPECIMEN:  Source of Specimen:  left, right mainstem bronchi  DISPOSITION OF SPECIMEN:  PATHOLOGY  COUNTS:  YES  TOURNIQUET:  * No tourniquets in log *  DICTATION: .Dragon Dictation  PLAN OF CARE: Admit to inpatient   PATIENT DISPOSITION:  ICU - intubated and critically ill.   Delay start of Pharmacological VTE agent (>24hrs) due to surgical blood loss or risk of bleeding: yes

## 2013-06-30 NOTE — ED Notes (Signed)
Critical Care MD at bedside. Pt is being eval for meat being in her airway and blocking part of her right lung.

## 2013-06-30 NOTE — ED Notes (Signed)
CG-4 and Chem-8 results given to the EDP

## 2013-06-30 NOTE — ED Notes (Signed)
Critical Care MD doing scope of pt's throat and saw meat in her airway. CC MD unable to get meat out of airway ENT consults for emergent OR transport to unblock airway.

## 2013-06-30 NOTE — Procedures (Addendum)
Bronchoscopy Procedure Note  Date of Operation: Jul 23, 2013 at 845PM. Note entered post procedure time in EPIC.  Pre-op Diagnosis: aspirated food   Post-op Diagnosis: same, obstruction of LLL orifice with hamburger meat  Surgeon: Shan Levans  Anesthesia: Pt unresponsive on vent  Operation: Flexible fiberoptic bronchoscopy, diagnostic   Findings: Aspirated food material, hamburger meat in LLL orifice and also subsegment RLL  Specimen: none  Estimated Blood Loss: none  Complications: none  Indications and History: The patient is a 72 y.o. female with aspirated meat into lungs from hamburger.  This was done as an emergency without consent in ED .  Description of Procedure: The pt was bronchoscoped via the ETT in Trauma C bay.  Aspirated meat , hamburger consistency, was removed from LLL and RLL.  However, large amount still seen in LLL nearly totally occluding same and some seen in RLL subsegment.  Dr Donata Clay arrived and graciously consented to take the pt to the OR for further FOB under anesthesia.  Attestation: I performed the procedure at approximately 845PM.   Kellie Hart WrightMD

## 2013-06-30 NOTE — H&P (Signed)
PULMONARY  / CRITICAL CARE MEDICINE  Name: Kellie King MRN: [id number] DOB: [birth year]    ADMISSION DATE:  28-Jul-2013 CONSULTATION DATE:  2013-07-28  REFERRING MD :  ED PRIMARY SERVICE: MICU    CHIEF COMPLAINT:  Respiratory arrest   BRIEF PATIENT DESCRIPTION:    72  YO CF with hx of Dementia , recurrent food aspiration , today with respiratory arrest resulting in cardiac arrest for 25 ?? minutes , now with severe aspiration of actual pieces of food and inability to ventilate   SIGNIFICANT EVENTS / STUDIES:   Two Bronchs , CT head , Chest X-ray , ABG   LINES / TUBES: Left Femoral , ETT X 2   CULTURES:   ANTIBIOTICS: Zosyn   HISTORY OF PRESENT ILLNESS:   This is a 72 year old female past medical history significant for CVA, dementia, schizoaffective, diabetes he comes today post CPR. Limited details are available at this time. Per EMS daughter had given patient a hamburger. When the daughter returned several minutes later noted patient had choked, had emesis and not responsive. CPR was initiated and EMS called. On EMS arrival patient is apneic pulseless and CPR was continued. Total 20 minutes of CPR was initiated with 2 rounds of epinephrine. ROSC obtained and transported to [facility]. No further details are available secondary to patient's condition and family not at bedside. When I got to the bedside, the patient was hypotensive , distended neck veins, and bringing volume on the Vent , she was wheezing on the right side more than the left side , a bedside bronch was done and the patient had right main and left main stem stacked with pieces of food . Dr Waynetta Sandy did a bedside bronch with removal of multiple pieces of what we think is hamberger , before that my self with RT removed several pieces of hamburger as well but patient continued to be hard to ventilate with severe air stacking bringing her MAP to the 30s. Before all of that I had to change the ETT to 7.5 from 7.0 to be able  to do all of that .    PAST MEDICAL HISTORY :  Past Medical History  Diagnosis Date  . CVA (cerebral vascular accident)     right  . Dementia   . Schizo affective schizophrenia   . Hypertension   . Thyroid disease   . Diabetes mellitus   . Anxiety   . Interstitial cystitis     has stimulator in her back  . Allergic rhinitis   . DDD (degenerative disc disease), lumbar   . Hypercholesterolemia   . Frequent PVCs   . Alzheimer disease   . Spinal stenosis     lumbar bulging discs   Past Surgical History  Procedure Laterality Date  . Tubal ligation    . Interstitial cystitis      surgery for this  . Foot surgery      bilateral feet surgery on the bone  . Implantation / placement epidural neurostimulator electrodes     Prior to Admission medications   Medication Sig Start Date End Date Taking? Authorizing Provider  amLODipine (NORVASC) 5 MG tablet Take 1 tablet (5 mg total) by mouth daily. 04/17/13   Laveda Norman, MD  aspirin 81 MG chewable tablet Chew 81 mg by mouth daily.      Historical Provider, MD  buPROPion (WELLBUTRIN SR) 100 MG 12 hr tablet Take 100 mg by mouth 2 (two) times daily.      Historical  Provider, MD  levothyroxine (SYNTHROID, LEVOTHROID) 50 MCG tablet Take 50 mcg by mouth daily.      Historical Provider, MD  lisinopril (PRINIVIL,ZESTRIL) 5 MG tablet Take 5 mg by mouth daily.    Historical Provider, MD  Multiple Vitamins-Minerals (MULTIVITAMIN WITH MINERALS) tablet Take 1 tablet by mouth daily.    Historical Provider, MD  risperiDONE (RISPERDAL) 0.5 MG tablet Take 1 tablet (0.5 mg total) by mouth at bedtime. 04/17/13   Laveda Norman, MD   Allergies  Allergen Reactions  . Atenolol     Bradycardia   . Other     Patient is allergic to a medicine for alzheimer  But does not know name  . Penicillins Other (See Comments)    unknown    FAMILY HISTORY:  History reviewed. No pertinent family history. SOCIAL HISTORY:  reports that she has never smoked. She has  never used smokeless tobacco. She reports that she does not drink alcohol or use illicit drugs.  REVIEW OF SYSTEMS:  Unable to obtain    SUBJECTIVE:   VITAL SIGNS: Temp:  [91 F (32.8 C)-92.7 F (33.7 C)] 91 F (32.8 C) (11/29 2035) Pulse Rate:  [54-98] 78 (11/29 2050) Resp:  [10-20] 17 (11/29 2050) BP: (51-179)/(34-87) 115/49 mmHg (11/29 2050) SpO2:  [89 %-100 %] 100 % (11/29 2050) HEMODYNAMICS:   VENTILATOR SETTINGS:   INTAKE / OUTPUT: Intake/Output   None     PHYSICAL EXAMINATION: Vitals reviewed.  Constitutional: intubated , not responsive  , no gag or cough .  HENT:  Head: Normocephalic and atraumatic.  ET tube in place. Emesis noted. Food particles in ET tube  Eyes: Pupils are equal, round, and reactive to light.  Cardiovascular: Regular rhythm and intact distal pulses.  Bradycardia Pulmonary/Chest: decrease air entry on the right more than the left , with wheezing on insp and expir  Patient intubated with bilateral breath sounds. ET tube in place  Abdominal: Soft. She exhibits no distension. There is no tenderness. There is no rebound and no guarding.  Musculoskeletal: She exhibits no edema.  Neurological:  GCS 3. Unable to fully assess neuro status secondary to unresponsiveness. Grossly no neuro focal deficit found  Skin: Skin is warm and dry. No rash noted.  Psychiatric:  Unable to assess. Unresponsive    LABS:  CBC  Recent Labs Lab 2013-07-04 1921 04-Jul-2013 1931  WBC 7.7  --   HGB 11.7* 12.2  HCT 35.8* 36.0  PLT 211  --    Coag's No results found for this basename: APTT, INR,  in the last 168 hours BMET  Recent Labs Lab 2013/07/04 1921 Jul 04, 2013 1931  NA 141 142  K 3.3* 3.2*  CL 102 109  CO2 19  --   BUN 18 19  CREATININE 0.64 0.80  GLUCOSE 272* 275*   Electrolytes  Recent Labs Lab 07/04/2013 1921  CALCIUM 8.5   Sepsis Markers  Recent Labs Lab 2013-07-04 1931  LATICACIDVEN 10.36*   ABG  Recent Labs Lab Jul 04, 2013 2117  PHART  7.136*  PCO2ART 57.8*  PO2ART 276.0*   Liver Enzymes  Recent Labs Lab Jul 04, 2013 1921  AST 19  ALT 9  ALKPHOS 91  BILITOT 0.4  ALBUMIN 2.8*   Cardiac Enzymes No results found for this basename: TROPONINI, PROBNP,  in the last 168 hours Glucose No results found for this basename: GLUCAP,  in the last 168 hours  Imaging Dg Chest Portable 1 View  July 04, 2013   CLINICAL DATA:  72 year old female with  respiratory difficulty.  EXAM: PORTABLE CHEST - 1 VIEW  COMPARISON:  07-15-13 and prior chest radiographs  FINDINGS: An endotracheal tube is identified with tip 4.1 cm above carina.  An NG tube is present entering the stomach with tip off the field of view.  Pneumomediastinum is now identified extending into the right neck.  There is no evidence of pneumothorax, airspace disease or definite pleural effusion.  Defibrillator pads overlying the left chest are noted.  IMPRESSION: Endotracheal tube with tip 4.1 cm above the carina.  Pneumomediastinum now identified without evidence of pneumothorax.  NG tube as described.   Electronically Signed   By: Laveda Abbe M.D.   On: 07/15/13 20:53   Dg Chest Portable 1 View  07/15/2013   CLINICAL DATA:  Respiratory difficult  EXAM: PORTABLE CHEST - 1 VIEW  COMPARISON:  06/25/2013  FINDINGS: Endotracheal tube tip is 7.5 cm from the carinal. NG tube tip is beyond the gastroesophageal junction. Lungs are hyper aerated. There are grossly clear. No pneumothorax.  IMPRESSION: Hyperaeration.  Endotracheal and NG tubes as described.   Electronically Signed   By: Maryclare Bean M.D.   On: 2013-07-15 19:58     CXR:  Severe hyperinflation   ASSESSMENT / PLAN:  PULMONARY A: Respiratory failure and arrest from aspiration and occlusion of airways , ETT tube changed to 7.5 , two bedside bronchs done with retrival of a lot of small pieces of haburger, will need to go to the OR with CT surgery for further removal of food maybe under rigid bronc , for now will start Zosyn for  the massive aspiration  Vent support   Sock is mostly related to post cardiac arrest syndrome and massive increase in the intrathoracic pressure due to valve like mechanism from the food in the airways  Pressors started    CARDIOVASCULAR A: Cardiac arrest from respiratory arrest , will obtain EKG , might need ECHO in the AM , Will need to be cooled if the CT head is -ve . Report [facility] PEA in the [facility]    RENAL A:  NAI   GASTROINTESTINAL A:  Recurrent aspiration , if she survive this event , need aspiration precuation , missing a lot of teeth , she needs pureed food    HEMATOLOGIC A: NAI  INFECTIOUS A:  Will start Zosyn for massive aspiration   ENDOCRINE A:  Resume Levothyroxine later   NEUROLOGIC A:  Cooling as neuro protective   measure after prolonged code   TODAY'S SUMMARY: Massive aspiration resulting in respiratory arrest resulting in PEA arrest for >25 min. Now to the OR after changing the tube with two bedside bronchs , CT surgery will take to the OR for possible intervention . Will need hypothermia protocol and family meeting since I think the prognosis is grimm at the best   I have personally obtained a history, examined the patient, evaluated laboratory and imaging results, formulated the assessment and plan and placed orders. CRITICAL CARE: The patient is critically ill with multiple organ systems failure and requires high complexity decision making for assessment and support, frequent evaluation and titration of therapies, application of advanced monitoring technologies and extensive interpretation of multiple databases. Critical Care Time devoted to patient care services described in this note is 74 minutes.    Pulmonary and Critical Care Medicine [facility] Pager: [phone]  2013/07/15, 9:20 PM

## 2013-06-30 NOTE — Anesthesia Preprocedure Evaluation (Signed)
Anesthesia Evaluation  Patient identified by MRN, date of birth, ID band Patient unresponsive    Reviewed: Allergy & Precautions, H&P , NPO status , Patient's Chart, lab work & pertinent test results  Airway       Dental   Pulmonary  intubated + rhonchi   + decreased breath sounds+ wheezing  + intubated    Cardiovascular hypertension, Rhythm:Regular Rate:Tachycardia  S/p cardiac arrest On levophed support   Neuro/Psych Anxiety Schizophrenia dementia CVA    GI/Hepatic   Endo/Other  diabetes  Renal/GU ARFRenal disease     Musculoskeletal   Abdominal   Peds  Hematology   Anesthesia Other Findings intubated  Reproductive/Obstetrics                           Anesthesia Physical Anesthesia Plan  ASA: IV and emergent  Anesthesia Plan: General   Post-op Pain Management:    Induction: Intravenous  Airway Management Planned: Oral ETT  Additional Equipment: Arterial line  Intra-op Plan:   Post-operative Plan: Post-operative intubation/ventilation  Informed Consent: I have reviewed the patients History and Physical, chart, labs and discussed the procedure including the risks, benefits and alternatives for the proposed anesthesia with the patient or authorized representative who has indicated his/her understanding and acceptance.   History available from chart only  Plan Discussed with: CRNA and Surgeon  Anesthesia Plan Comments:         Anesthesia Quick Evaluation

## 2013-07-01 DIAGNOSIS — R4182 Altered mental status, unspecified: Secondary | ICD-10-CM

## 2013-07-01 DIAGNOSIS — T17900A Unspecified foreign body in respiratory tract, part unspecified causing asphyxiation, initial encounter: Secondary | ICD-10-CM

## 2013-07-01 DIAGNOSIS — I369 Nonrheumatic tricuspid valve disorder, unspecified: Secondary | ICD-10-CM

## 2013-07-01 DIAGNOSIS — R092 Respiratory arrest: Secondary | ICD-10-CM

## 2013-07-01 DIAGNOSIS — F0390 Unspecified dementia without behavioral disturbance: Secondary | ICD-10-CM

## 2013-07-01 DIAGNOSIS — J9600 Acute respiratory failure, unspecified whether with hypoxia or hypercapnia: Secondary | ICD-10-CM

## 2013-07-01 DIAGNOSIS — Z5189 Encounter for other specified aftercare: Secondary | ICD-10-CM

## 2013-07-01 DIAGNOSIS — T17308A Unspecified foreign body in larynx causing other injury, initial encounter: Secondary | ICD-10-CM

## 2013-07-01 LAB — POCT I-STAT, CHEM 8
BUN: 14 mg/dL (ref 6–23)
BUN: 15 mg/dL (ref 6–23)
Calcium, Ion: 1.04 mmol/L — ABNORMAL LOW (ref 1.13–1.30)
Calcium, Ion: 1.05 mmol/L — ABNORMAL LOW (ref 1.13–1.30)
Calcium, Ion: 1.08 mmol/L — ABNORMAL LOW (ref 1.13–1.30)
Calcium, Ion: 1.11 mmol/L — ABNORMAL LOW (ref 1.13–1.30)
Chloride: 106 mEq/L (ref 96–112)
Chloride: 106 meq/L (ref 96–112)
Creatinine, Ser: 0.5 mg/dL (ref 0.50–1.10)
Creatinine, Ser: 0.6 mg/dL (ref 0.50–1.10)
Creatinine, Ser: 0.6 mg/dL (ref 0.50–1.10)
Creatinine, Ser: 0.7 mg/dL (ref 0.50–1.10)
Glucose, Bld: 243 mg/dL — ABNORMAL HIGH (ref 70–99)
Glucose, Bld: 325 mg/dL — ABNORMAL HIGH (ref 70–99)
Glucose, Bld: 334 mg/dL — ABNORMAL HIGH (ref 70–99)
Glucose, Bld: 353 mg/dL — ABNORMAL HIGH (ref 70–99)
HCT: 38 % (ref 36.0–46.0)
HCT: 44 % (ref 36.0–46.0)
Hemoglobin: 12.9 g/dL (ref 12.0–15.0)
Hemoglobin: 14.6 g/dL (ref 12.0–15.0)
Hemoglobin: 15 g/dL (ref 12.0–15.0)
Hemoglobin: 15 g/dL (ref 12.0–15.0)
Potassium: 2.7 meq/L — CL (ref 3.5–5.1)
Potassium: 2.8 mEq/L — ABNORMAL LOW (ref 3.5–5.1)
Potassium: 3.1 mEq/L — ABNORMAL LOW (ref 3.5–5.1)
Sodium: 146 mEq/L — ABNORMAL HIGH (ref 135–145)
Sodium: 146 meq/L — ABNORMAL HIGH (ref 135–145)
TCO2: 20 mmol/L (ref 0–100)
TCO2: 25 mmol/L (ref 0–100)

## 2013-07-01 LAB — BASIC METABOLIC PANEL WITH GFR
BUN: 15 mg/dL (ref 6–23)
BUN: 14 mg/dL (ref 6–23)
CO2: 22 meq/L (ref 19–32)
CO2: 20 meq/L (ref 19–32)
Calcium: 7.1 mg/dL — ABNORMAL LOW (ref 8.4–10.5)
Calcium: 7.3 mg/dL — ABNORMAL LOW (ref 8.4–10.5)
Chloride: 108 meq/L (ref 96–112)
Chloride: 107 meq/L (ref 96–112)
Creatinine, Ser: 0.48 mg/dL — ABNORMAL LOW (ref 0.50–1.10)
Creatinine, Ser: 0.4 mg/dL — ABNORMAL LOW (ref 0.50–1.10)
GFR calc Af Amer: >90 mL/min
GFR calc Af Amer: >90 mL/min
GFR calc non Af Amer: >90 mL/min
GFR calc non Af Amer: >90 mL/min
Glucose, Bld: 329 mg/dL — ABNORMAL HIGH (ref 70–99)
Glucose, Bld: 168 mg/dL — ABNORMAL HIGH (ref 70–99)
Potassium: 3.5 meq/L (ref 3.5–5.1)
Potassium: 3.3 meq/L — ABNORMAL LOW (ref 3.5–5.1)
Sodium: 143 meq/L (ref 135–145)
Sodium: 139 meq/L (ref 135–145)

## 2013-07-01 LAB — GLUCOSE, CAPILLARY
Glucose-Capillary: 102 mg/dL — ABNORMAL HIGH (ref 70–99)
Glucose-Capillary: 106 mg/dL — ABNORMAL HIGH (ref 70–99)
Glucose-Capillary: 110 mg/dL — ABNORMAL HIGH (ref 70–99)
Glucose-Capillary: 121 mg/dL — ABNORMAL HIGH (ref 70–99)
Glucose-Capillary: 125 mg/dL — ABNORMAL HIGH (ref 70–99)
Glucose-Capillary: 138 mg/dL — ABNORMAL HIGH (ref 70–99)
Glucose-Capillary: 177 mg/dL — ABNORMAL HIGH (ref 70–99)
Glucose-Capillary: 189 mg/dL — ABNORMAL HIGH (ref 70–99)
Glucose-Capillary: 270 mg/dL — ABNORMAL HIGH (ref 70–99)
Glucose-Capillary: 277 mg/dL — ABNORMAL HIGH (ref 70–99)
Glucose-Capillary: 296 mg/dL — ABNORMAL HIGH (ref 70–99)
Glucose-Capillary: 94 mg/dL (ref 70–99)
Glucose-Capillary: 95 mg/dL (ref 70–99)
Glucose-Capillary: 98 mg/dL (ref 70–99)

## 2013-07-01 LAB — CBC
HCT: 41.4 % (ref 36.0–46.0)
HCT: 41.5 % (ref 36.0–46.0)
Hemoglobin: 14.2 g/dL (ref 12.0–15.0)
MCHC: 33.6 g/dL (ref 30.0–36.0)
MCV: 90.8 fL (ref 78.0–100.0)
MCV: 89.6 fL (ref 78.0–100.0)
RBC: 4.56 MIL/uL (ref 3.87–5.11)
RBC: 4.63 MIL/uL (ref 3.87–5.11)
RDW: 13 % (ref 11.5–15.5)
WBC: 4.1 10*3/uL (ref 4.0–10.5)
WBC: 3.4 10*3/uL — ABNORMAL LOW (ref 4.0–10.5)

## 2013-07-01 LAB — POCT I-STAT 3, ART BLOOD GAS (G3+)
Acid-base deficit: 2 mmol/L (ref 0.0–2.0)
Bicarbonate: 23.6 mEq/L (ref 20.0–24.0)
Bicarbonate: 24.2 mEq/L — ABNORMAL HIGH (ref 20.0–24.0)
O2 Saturation: 100 %
O2 Saturation: 98 %
Patient temperature: 33.1
TCO2: 25 mmol/L (ref 0–100)
TCO2: 26 mmol/L (ref 0–100)
pCO2 arterial: 37.6 mmHg (ref 35.0–45.0)
pCO2 arterial: 39.6 mmHg (ref 35.0–45.0)
pH, Arterial: 7.368 (ref 7.350–7.450)
pO2, Arterial: 365 mmHg — ABNORMAL HIGH (ref 80.0–100.0)
pO2, Arterial: 98 mmHg (ref 80.0–100.0)

## 2013-07-01 LAB — BASIC METABOLIC PANEL
BUN: 14 mg/dL (ref 6–23)
BUN: 16 mg/dL (ref 6–23)
BUN: 16 mg/dL (ref 6–23)
CO2: 19 mEq/L (ref 19–32)
CO2: 20 mEq/L (ref 19–32)
CO2: 20 mEq/L (ref 19–32)
Calcium: 7.9 mg/dL — ABNORMAL LOW (ref 8.4–10.5)
Chloride: 106 mEq/L (ref 96–112)
Chloride: 108 mEq/L (ref 96–112)
Chloride: 105 mEq/L (ref 96–112)
Creatinine, Ser: 0.43 mg/dL — ABNORMAL LOW (ref 0.50–1.10)
GFR calc Af Amer: >90 mL/min (ref >90)
GFR calc non Af Amer: >90 mL/min (ref >90)
GFR calc non Af Amer: >90 mL/min (ref >90)
Glucose, Bld: 269 mg/dL — ABNORMAL HIGH (ref 70–99)
Glucose, Bld: 129 mg/dL — ABNORMAL HIGH (ref 70–99)
Glucose, Bld: 162 mg/dL — ABNORMAL HIGH (ref 70–99)
Glucose, Bld: 161 mg/dL — ABNORMAL HIGH (ref 70–99)
Potassium: 3.2 mEq/L — ABNORMAL LOW (ref 3.5–5.1)
Potassium: 4.2 mEq/L (ref 3.5–5.1)
Sodium: 137 mEq/L (ref 135–145)

## 2013-07-01 LAB — TROPONIN I
Troponin I: 1.09 ng/mL (ref <0.30)
Troponin I: 1.42 ng/mL (ref <0.30)

## 2013-07-01 LAB — PROTIME-INR
INR: 0.96 (ref 0.00–1.49)
Prothrombin Time: 12.6 seconds (ref 11.6–15.2)

## 2013-07-01 LAB — PHOSPHORUS: Phosphorus: 1.3 mg/dL — ABNORMAL LOW (ref 2.3–4.6)

## 2013-07-01 LAB — MRSA PCR SCREENING: MRSA by PCR: NEGATIVE

## 2013-07-01 LAB — LACTIC ACID, PLASMA: Lactic Acid, Venous: 2.7 mmol/L — ABNORMAL HIGH (ref 0.5–2.2)

## 2013-07-01 LAB — MAGNESIUM: Magnesium: 1.7 mg/dL (ref 1.5–2.5)

## 2013-07-01 LAB — APTT: aPTT: 20 seconds — ABNORMAL LOW (ref 24–37)

## 2013-07-01 MED ORDER — SODIUM CHLORIDE 0.9 % IV SOLN
INTRAVENOUS | Status: DC
  Administered 2013-07-01: 9 [IU]/h via INTRAVENOUS
  Administered 2013-07-01: 6.1 [IU]/h via INTRAVENOUS
  Administered 2013-07-01: 2.2 [IU]/h via INTRAVENOUS
  Administered 2013-07-01: 8.1 [IU]/h via INTRAVENOUS
  Filled 2013-07-01 (×2): qty 1

## 2013-07-01 MED ORDER — NOREPINEPHRINE BITARTRATE 1 MG/ML IJ SOLN
0.5000 ug/min | INTRAVENOUS | Status: DC
  Filled 2013-07-01: qty 4

## 2013-07-01 MED ORDER — DEXTROSE 10 % IV SOLN: INTRAVENOUS | Status: DC | PRN

## 2013-07-01 MED ORDER — BIOTENE DRY MOUTH MT LIQD
15.0000 mL | Freq: Four times a day (QID) | OROMUCOSAL | Status: DC
  Administered 2013-07-01 – 2013-07-07 (×22): 15 mL via OROMUCOSAL

## 2013-07-01 MED ORDER — CHLORHEXIDINE GLUCONATE 0.12 % MT SOLN
15.0000 mL | Freq: Two times a day (BID) | OROMUCOSAL | Status: DC
  Administered 2013-07-01 – 2013-07-06 (×11): 15 mL via OROMUCOSAL
  Filled 2013-07-01 (×10): qty 15

## 2013-07-01 MED ORDER — POTASSIUM CHLORIDE 10 MEQ/50ML IV SOLN
INTRAVENOUS | Status: AC
  Filled 2013-07-01: qty 50

## 2013-07-01 MED ORDER — POTASSIUM CHLORIDE 10 MEQ/50ML IV SOLN
10.0000 meq | INTRAVENOUS | Status: AC
  Administered 2013-07-01 (×2): 10 meq via INTRAVENOUS

## 2013-07-01 MED ORDER — SODIUM CHLORIDE 0.9 % IJ SOLN: 10.0000 mL | INTRAMUSCULAR | Status: DC | PRN

## 2013-07-01 MED ORDER — INSULIN GLARGINE 100 UNIT/ML ~~LOC~~ SOLN
10.0000 [IU] | SUBCUTANEOUS | Status: DC
  Administered 2013-07-01 – 2013-07-04 (×3): 10 [IU] via SUBCUTANEOUS
  Filled 2013-07-01 (×7): qty 0.1

## 2013-07-01 MED ORDER — POTASSIUM CHLORIDE 10 MEQ/50ML IV SOLN
10.0000 meq | INTRAVENOUS | Status: AC
  Administered 2013-07-01 (×4): 10 meq via INTRAVENOUS
  Filled 2013-07-01 (×2): qty 50

## 2013-07-01 MED ORDER — PHENYLEPHRINE HCL 10 MG/ML IJ SOLN
30.0000 ug/min | INTRAMUSCULAR | Status: DC
  Administered 2013-07-01: 30 ug/min via INTRAVENOUS
  Administered 2013-07-02: 50 ug/min via INTRAVENOUS
  Filled 2013-07-01 (×4): qty 1

## 2013-07-01 MED ORDER — INSULIN ASPART 100 UNIT/ML ~~LOC~~ SOLN
2.0000 [IU] | SUBCUTANEOUS | Status: DC
  Administered 2013-07-01 – 2013-07-03 (×4): 2 [IU] via SUBCUTANEOUS
  Administered 2013-07-03: 4 [IU] via SUBCUTANEOUS
  Administered 2013-07-03 – 2013-07-04 (×2): 2 [IU] via SUBCUTANEOUS
  Administered 2013-07-04 (×2): 4 [IU] via SUBCUTANEOUS
  Administered 2013-07-04: 2 [IU] via SUBCUTANEOUS
  Administered 2013-07-04: 4 [IU] via SUBCUTANEOUS
  Administered 2013-07-05: 2 [IU] via SUBCUTANEOUS
  Administered 2013-07-05: 4 [IU] via SUBCUTANEOUS
  Administered 2013-07-05 – 2013-07-06 (×3): 2 [IU] via SUBCUTANEOUS
  Administered 2013-07-06: 4 [IU] via SUBCUTANEOUS

## 2013-07-01 MED ORDER — POTASSIUM CHLORIDE 10 MEQ/50ML IV SOLN
10.0000 meq | INTRAVENOUS | Status: AC
  Administered 2013-07-01 (×4): 10 meq via INTRAVENOUS
  Filled 2013-07-01 (×4): qty 50

## 2013-07-01 MED ORDER — DEXTROSE 5 % IV SOLN
1.0000 g | Freq: Two times a day (BID) | INTRAVENOUS | Status: DC
  Administered 2013-07-01 – 2013-07-02 (×5): 1 g via INTRAVENOUS
  Filled 2013-07-01 (×8): qty 1

## 2013-07-01 MED ORDER — NOREPINEPHRINE BITARTRATE 1 MG/ML IJ SOLN
0.5000 ug/min | INTRAMUSCULAR | Status: DC
  Filled 2013-07-01: qty 16

## 2013-07-01 MED ORDER — ATROPINE SULFATE 0.1 MG/ML IJ SOLN
INTRAMUSCULAR | Status: AC
  Filled 2013-07-01: qty 10

## 2013-07-01 NOTE — Progress Notes (Signed)
ANTIBIOTIC CONSULT NOTE - INITIAL  Pharmacy Consult for Cefepime Indication: aspiration PNA  Allergies  Allergen Reactions  . Atenolol     Bradycardia   . Other     Patient is allergic to a medicine for alzheimer  But does not know name  . Penicillins Other (See Comments)    unknown    Patient Measurements: Height: 5' 4.17" (163 cm) Weight: 108 lb 0.4 oz (49 kg) IBW/kg (Calculated) : 55.1   Vital Signs: Temp: 91 F (32.8 C) (11/29 2035) BP: 114/48 mmHg (11/29 2135) Pulse Rate: 89 (11/29 2135) Intake/Output from previous day: 11/29 0701 - 11/30 0700 In: 400 [I.V.:400] Out: -  Intake/Output from this shift: Total I/O In: 400 [I.V.:400] Out: -   Labs:  Recent Labs  Jul 28, 2013 1921 Jul 28, 2013 1931 07/01/13 0001  WBC 7.7  --   --   HGB 11.7* 12.2 12.9  PLT 211  --   --   CREATININE 0.64 0.80 0.70   Estimated Creatinine Clearance: 47.7 ml/min (by C-G formula based on Cr of 0.7). No results found for this basename: VANCOTROUGH, VANCOPEAK, VANCORANDOM, GENTTROUGH, GENTPEAK, GENTRANDOM, TOBRATROUGH, TOBRAPEAK, TOBRARND, AMIKACINPEAK, AMIKACINTROU, AMIKACIN,  in the last 72 hours   Microbiology: No results found for this or any previous visit (from the past 720 hour(s)).  Medical History: Past Medical History  Diagnosis Date  . CVA (cerebral vascular accident)     right  . Dementia   . Schizo affective schizophrenia   . Hypertension   . Thyroid disease   . Diabetes mellitus   . Anxiety   . Interstitial cystitis     has stimulator in her back  . Allergic rhinitis   . DDD (degenerative disc disease), lumbar   . Hypercholesterolemia   . Frequent PVCs   . Alzheimer disease   . Spinal stenosis     lumbar bulging discs    Medications:  Prescriptions prior to admission  Medication Sig Dispense Refill  . amLODipine (NORVASC) 5 MG tablet Take 1 tablet (5 mg total) by mouth daily.  30 tablet  0  . aspirin 81 MG chewable tablet Chew 81 mg by mouth daily.         Marland Kitchen buPROPion (WELLBUTRIN SR) 100 MG 12 hr tablet Take 100 mg by mouth 2 (two) times daily.        Marland Kitchen levothyroxine (SYNTHROID, LEVOTHROID) 50 MCG tablet Take 50 mcg by mouth daily.        Marland Kitchen lisinopril (PRINIVIL,ZESTRIL) 5 MG tablet Take 5 mg by mouth daily.      . Multiple Vitamins-Minerals (MULTIVITAMIN WITH MINERALS) tablet Take 1 tablet by mouth daily.      . risperiDONE (RISPERDAL) 0.5 MG tablet Take 1 tablet (0.5 mg total) by mouth at bedtime.  30 tablet  0   Assessment: 72 yo female admitted with respiratory arrest due to food aspiration/obstruction for empiric antibiotics  Plan:  Cefepime 1 g IV q12h  Eddie Candle 07/01/2013,12:26 AM

## 2013-07-01 NOTE — Addendum Note (Signed)
Addendum created 07/01/13 1203 by Edmonia Caprio, CRNA   Modules edited: Anesthesia Events

## 2013-07-01 NOTE — Progress Notes (Signed)
eLink Physician-Brief Progress Note Patient Name: CLIMMIE CRONCE DOB: [birth year] MRN: [id number]  Date of Service  07/01/2013   HPI/Events of Note   K low  eICU Interventions  K supp IV   Intervention Category Major Interventions: Electrolyte abnormality - evaluation and management  Shan Levans 07/01/2013, 6:38 AM

## 2013-07-01 NOTE — Progress Notes (Signed)
eLink Physician-Brief Progress Note Patient Name: Kellie King DOB: 05/26/1941 MRN: [id number]  Date of Service  07/01/2013   HPI/Events of Note   CBGs high  eICU Interventions  Insulin drip    Intervention Category Major Interventions: Hyperglycemia - active titration of insulin therapy  Shan Levans 07/01/2013, 12:56 AM

## 2013-07-01 NOTE — Progress Notes (Signed)
eLink Physician-Brief Progress Note Patient Name: Kellie King DOB: 1941-05-23 MRN: [id number]  Date of Service  07/01/2013   HPI/Events of Note   K 2.7  eICU Interventions  KCL supp IV   Intervention Category Major Interventions: Electrolyte abnormality - evaluation and management  Shan Levans 07/01/2013, 12:06 AM

## 2013-07-01 NOTE — Progress Notes (Signed)
Utilization Review Completed.Kellie King T11/30/2014  

## 2013-07-01 NOTE — Progress Notes (Signed)
*  PRELIMINARY RESULTS* Echocardiogram 2D Echocardiogram has been performed.  Kellie King 07/01/2013, 9:22 AM

## 2013-07-01 NOTE — ED Provider Notes (Signed)
I saw and evaluated the patient, reviewed the resident's note and I agree with the findings and plan.  EKG Interpretation    Date/Time:  Saturday 30-Jul-2013 19:17:53 EST Ventricular Rate:  57 PR Interval:  120 QRS Duration: 121 QT Interval:  473 QTC Calculation: 461 R Axis:   -64 Text Interpretation:  Age not entered, assumed to be  72 years old for purpose of ECG interpretation Sinus rhythm Left bundle branch block Similar to prior Confirmed by [facility]  MD, Kainen Struckman ([id number]) on 07/30/2013 7:26:34 PM            L femoral line placed by Dr. Arlie Solomons. I supervised procedure, wire was out at procedure in. I obtained needle stick using ultrasound, Dr. Arlie Solomons finished the procedure.  Dagmar Hait, MD 07/01/13 0001

## 2013-07-01 NOTE — Progress Notes (Signed)
PULMONARY  / CRITICAL CARE MEDICINE  Name: Kellie King MRN: [id number] DOB: 04/19/1941    ADMISSION DATE:  07-20-13 CONSULTATION DATE:  July 20, 2013  REFERRING MD :  ED PRIMARY SERVICE: MICU    CHIEF COMPLAINT:  Respiratory arrest   BRIEF PATIENT DESCRIPTION:    72  YO CF with hx of Dementia , recurrent food aspiration , today with respiratory arrest resulting in cardiac arrest for 25 ?? minutes , now with severe aspiration of actual pieces of food and inability to ventilate   SIGNIFICANT EVENTS / STUDIES:   Two Bronchs , CT head , Chest X-ray , ABG   LINES / TUBES: Left Femoral , ETT X 2   CULTURES:   ANTIBIOTICS: Zosyn    SUBJECTIVE:  Still on hypothermia protocol   VITAL SIGNS: Temp:  [88.3 F (31.3 C)-92.7 F (33.7 C)] 90.1 F (32.3 C) (11/30 0700) Pulse Rate:  [54-101] 65 (11/30 0700) Resp:  [10-27] 16 (11/30 0700) BP: (51-179)/(34-87) 129/70 mmHg (11/30 0000) SpO2:  [89 %-100 %] 100 % (11/30 0700) Arterial Line BP: (81-212)/(61-125) 137/79 mmHg (11/30 0700) FiO2 (%):  [40 %-100 %] 40 % (11/30 0538) Weight:  [46.3 kg (102 lb 1.2 oz)-49.4 kg (108 lb 14.5 oz)] 49.4 kg (108 lb 14.5 oz) (11/30 0500) HEMODYNAMICS: CVP:  [1 mmHg-5 mmHg] 1 mmHg VENTILATOR SETTINGS: Vent Mode:  [-] PRVC FiO2 (%):  [40 %-100 %] 40 % Set Rate:  [10 bmp-16 bmp] 16 bmp Vt Set:  [400 mL-450 mL] 450 mL PEEP:  [5 cmH20] 5 cmH20 Plateau Pressure:  [19 cmH20-35 cmH20] 19 cmH20 INTAKE / OUTPUT: Intake/Output     11/29 0701 - 11/30 0700 11/30 0701 - 12/01 0700   I.V. (mL/kg) 950.9 (19.2)    IV Piggyback 250    Total Intake(mL/kg) 1200.9 (24.3)    Urine (mL/kg/hr) 225    Total Output 225     Net +975.9          Urine Occurrence 2 x      PHYSICAL EXAMINATION: Constitutional: intubated , paralyzed and sedated HENT:  Head: Normocephalic and atraumatic.  ET tube in place. Eyes: Pupils are equal, round, and reactive to light.  Cardiovascular: Regular rhythm and intact  distal pulses.  Pulmonary/Chest:improved airflow Patient intubated with bilateral breath sounds. ET tube in place  Abdominal: Soft. She exhibits no distension. There is no tenderness. There is no rebound and no guarding.  Musculoskeletal: She exhibits no edema.  Neurological:  paralyzed Skin: Skin is warm and dry. No rash noted.      LABS:  CBC  Recent Labs Lab 20-Jul-2013 1921  07/01/13 0155  07/01/13 0405 07/01/13 0441 07/01/13 0611  WBC 7.7  --  4.1  --   --  3.4*  --   HGB 11.7*  < > 13.9  < > 15.0 14.2 14.6  HCT 35.8*  < > 41.4  < > 44.0 41.5 43.0  PLT 211  --  259  --   --  203  --   < > = values in this interval not displayed. Coag's  Recent Labs Lab 07/01/13 0100  APTT 20*  INR 0.96   BMET  Recent Labs Lab 20-Jul-2013 1921  07/01/13 0115  07/01/13 0405 07/01/13 0441 07/01/13 0611  NA 141  < > 143  < > 145 140 146*  K 3.3*  < > 3.5  < > 3.1* 3.2* 2.7*  CL 102  < > 108  < > 106 106 107  CO2 19  --  22  --   --  22  --   BUN 18  < > 15  < > 14 14 14   CREATININE 0.64  < > 0.48*  < > 0.60 0.40* 0.50  GLUCOSE 272*  < > 329*  < > 325* 269* 243*  < > = values in this interval not displayed. Electrolytes  Recent Labs Lab 06/17/2013 1921 07/01/13 0115 07/01/13 0441  CALCIUM 8.5 7.1* 7.4*  MG  --   --  1.7  PHOS  --   --  1.3*   Sepsis Markers  Recent Labs Lab 06/22/2013 1931 07/01/13 0105  LATICACIDVEN 10.36* 2.7*   ABG  Recent Labs Lab 06/05/2013 2117 07/01/13 0045 07/01/13 0527  PHART 7.136* 7.368 7.388  PCO2ART 57.8* 39.6 37.6  PO2ART 276.0* 98.0 365.0*   Liver Enzymes  Recent Labs Lab 06/06/2013 1921  AST 19  ALT 9  ALKPHOS 91  BILITOT 0.4  ALBUMIN 2.8*   Cardiac Enzymes  Recent Labs Lab 07/01/13 0115  TROPONINI 1.09*   Glucose  Recent Labs Lab 07/01/13 0156 07/01/13 0255 07/01/13 0403 07/01/13 0516 07/01/13 0608 07/01/13 0722  GLUCAP 277* 284* 263* 284* 221* 189*    Imaging Ct Head Wo Contrast  06/26/2013    CLINICAL DATA:  Patient aspirated cheeseburger. Patient had cardiac arrest with question of anoxic injury. Foreign body removal.  EXAM: CT HEAD WITHOUT CONTRAST  TECHNIQUE: Contiguous axial images were obtained from the base of the skull through the vertex without intravenous contrast.  COMPARISON:  04/05/2013  FINDINGS: Diffuse cerebral atrophy. Ventricular dilatation consistent with central atrophy. Old bilateral cerebellar infarcts. Low-attenuation changes in the deep white matter consistent with small vessel ischemia. Old lacunar infarcts in the thalamus bilaterally and basal ganglia. Low-attenuation focus in the right pons and midbrain consistent with old ischemia, stable since previous study. No mass effect or midline shift. No abnormal extra-axial fluid collections. Move no evidence of acute intracranial hemorrhage. No depressed skull fractures. Retention cysts in the ethmoid air cells. No air-fluid levels in the paranasal sinuses. Mastoid air cells are not opacified. Vascular calcifications.  IMPRESSION: No evidence of acute intracranial abnormality. Diffuse atrophy, small vessel ischemic changes, old cerebellar infarcts, and old lacune or infarcts, including lacunar infarct in the right brainstem.   Electronically Signed   By: Burman Nieves M.D.   On: 06/17/2013 22:18   Dg Chest Portable 1 View  06/13/2013   CLINICAL DATA:  Aspiration.  EXAM: PORTABLE CHEST - 1 VIEW  COMPARISON:  06/02/2013 at 2024 hr  FINDINGS: Endotracheal tube tip measures about 2.5 cm above the carinal. Enteric tube is present. The tip is out of the field of view. The proximal side hole is located about the EG junction region. Normal heart size and pulmonary vascularity. Hazy parenchymal infiltration in the right upper lung consistent with history of aspiration. No pleural effusion. No visible pneumothorax. Pneumomediastinum seen on the previous study is suggested in the periaortic region but is less well seen than on the  previous study. The neck base is not included on the current study.  IMPRESSION: The proximal side hole of the enteric tube is located at the EG junction region. This may need to be advanced. Hazy infiltrates in the right upper lung compatible with history of aspiration. Pneumomediastinum is again demonstrated but less prominent than on the previous study.   Electronically Signed   By: Burman Nieves M.D.   On: 06/07/2013 21:33   Dg Chest  Portable 1 View  06/26/2013   CLINICAL DATA:  72 year old female with respiratory difficulty.  EXAM: PORTABLE CHEST - 1 VIEW  COMPARISON:  06/17/2013 and prior chest radiographs  FINDINGS: An endotracheal tube is identified with tip 4.1 cm above carina.  An NG tube is present entering the stomach with tip off the field of view.  Pneumomediastinum is now identified extending into the right neck.  There is no evidence of pneumothorax, airspace disease or definite pleural effusion.  Defibrillator pads overlying the left chest are noted.  IMPRESSION: Endotracheal tube with tip 4.1 cm above the carina.  Pneumomediastinum now identified without evidence of pneumothorax.  NG tube as described.   Electronically Signed   By: Laveda Abbe M.D.   On: 06/04/2013 20:53   Dg Chest Portable 1 View  06/27/2013   CLINICAL DATA:  Respiratory difficult  EXAM: PORTABLE CHEST - 1 VIEW  COMPARISON:  06/25/2013  FINDINGS: Endotracheal tube tip is 7.5 cm from the carinal. NG tube tip is beyond the gastroesophageal junction. Lungs are hyper aerated. There are grossly clear. No pneumothorax.  IMPRESSION: Hyperaeration.  Endotracheal and NG tubes as described.   Electronically Signed   By: Maryclare Bean M.D.   On: 06/07/2013 19:58     CXR:  Severe hyperinflation   ASSESSMENT / PLAN:  PULMONARY A: Respiratory failure and arrest from aspiration and occlusion of airways  apprec dr Zenaida Niece trigt and removal of FB in OR   Plan Cont full vent support   CARDIOVASCULAR A: Cardiac arrest from respiratory  arrest , will obtain EKG , might need ECHO in the AM , Will need to be cooled if the CT head is -ve . Report of PEA in the Field  P: Cont hypothermai protocol Cont vasopressors  RENAL A:  No issues monitor  GASTROINTESTINAL A:  Recurrent aspiration , if she survive this event , need aspiration precuation , missing a lot of teeth , she needs pureed food  P: NPO  HEMATOLOGIC A: no issues P: Monitor  INFECTIOUS A:  Aspiration P: Cont zosyn  ENDOCRINE A:  Resume Levothyroxine later   NEUROLOGIC A:  Cooling as neuro protective   measure after prolonged code   TODAY'S SUMMARY: Massive aspiration resulting in respiratory arrest resulting in PEA arrest for >25 min. S/p going to OR after changing the tube with two bedside bronchs , CT surgery removed additional material from lungs.Cont  hypothermia protocol but prognosis is grave.  I will update daughter by phone.  I have personally obtained a history, examined the patient, evaluated laboratory and imaging results, formulated the assessment and plan and placed orders. CRITICAL CARE: The patient is critically ill with multiple organ systems failure and requires high complexity decision making for assessment and support, frequent evaluation and titration of therapies, application of advanced monitoring technologies and extensive interpretation of multiple databases. Critical Care Time devoted to patient care services described in this note is 40 minutes.   Dorcas Carrow Beeper  [phone]  Cell  [phone]  If no response or cell goes to voicemail, call beeper [phone]  Pulmonary and Critical Care Medicine [facility] Pager: [phone]  07/01/2013, 8:11 AM

## 2013-07-01 NOTE — Progress Notes (Signed)
CRITICAL VALUE ALERT  Critical value received: Troponin 1.09  Date of notification:  07/01/13 Time of notification:  0315  Critical value read back:yes  Nurse who received alert:  Exie Parody RN  MD notified (1st page): CCM Time of first page:  0315  Responding MD: Dr. Delford Field  Time MD responded:  [phone]

## 2013-07-01 NOTE — Op Note (Signed)
NAMEMarland Kitchen  PEGGE, CUMBERLEDGE NO.:  [id number]  MEDICAL RECORD NO.:  [id number]  LOCATION:  2H02C                        FACILITY:  [facility]  PHYSICIAN:  Kerin Perna, M.D.  DATE OF BIRTH:  1940/08/17  DATE OF PROCEDURE:  06/29/2013 DATE OF DISCHARGE:                              OPERATIVE REPORT   OPERATION:  Video bronchoscopy.  PREOPERATIVE DIAGNOSIS:  Airway obstruction from aspirated food matter.  POSTOPERATIVE DIAGNOSIS:  Airway obstruction from aspirated food matter.  SURGEON:  Kerin Perna, MD  ANESTHESIA:  General by Dr. Ranee Gosselin.  INDICATIONS:  The patient is a 72 year old female with dementia, status post prior stroke and with chronic swallowing difficulties.  This evening, she was found unresponsive by her daughter lying on the floor next to a meal.  CPR was started and 911 was called.  EMS came and intubated and resuscitated the patient and a pulse returned.  She was brought to the emergency department where she was found to have severe airway obstruction.  Bronchoscopy by Critical Care demonstrated some hamburger matter in her airway which was difficult to remove with the equipment available in the ED and Thoracic Surgery was consulted for further airway intervention.  I examined the patient and agreed that bronchoscopy in the operating would be the best option.  I obtained an informed consent from the patient's daughter, Georg Ruddle, phone [phone].  The patient was taken directly from the ED to the operating room.  DESCRIPTION OF PROCEDURE:  The patient was brought to the operating room and placed supine on the operating table.  A proper time-out was performed.  The video bronchoscope was passed.  Large amount of hamburger material was present mainly in the left mainstem bronchus, with almost total occlusion.  Using the endobronchial devices including biopsy tome, basket, retriever, and as well as vigorous irrigation and suctioning, the left  mainstem bronchus was opened as well as the segments of the upper and lower lobes.  The right mainstem bronchus in the segments of the right lung lobes were irrigated and there was only very small particular matter with all major segments patent.  The bronchoscope was then withdrawn.  During the procedure, the endotracheal tube was exchanged by Anesthesia from a 7.5 to an 8.0 tube before starting the bronchoscopy.  The patient returned from the OR to ICU for critical care management of her respiratory and cardiac arrest using the hypothermia protocol.  The patient remained stable during the operative procedure.     Kerin Perna, M.D.     PV/MEDQ  D:  06/25/2013  T:  07/01/2013  Job:  [id number]

## 2013-07-02 ENCOUNTER — Inpatient Hospital Stay (HOSPITAL_COMMUNITY): Payer: Medicare Other

## 2013-07-02 DIAGNOSIS — N179 Acute kidney failure, unspecified: Secondary | ICD-10-CM

## 2013-07-02 DIAGNOSIS — R627 Adult failure to thrive: Secondary | ICD-10-CM

## 2013-07-02 LAB — BASIC METABOLIC PANEL
BUN: 17 mg/dL (ref 6–23)
CO2: 20 mEq/L (ref 19–32)
Calcium: 7.9 mg/dL — ABNORMAL LOW (ref 8.4–10.5)
Calcium: 7.3 mg/dL — ABNORMAL LOW (ref 8.4–10.5)
Calcium: 7.5 mg/dL — ABNORMAL LOW (ref 8.4–10.5)
Chloride: 108 mEq/L (ref 96–112)
Chloride: 109 mEq/L (ref 96–112)
Creatinine, Ser: 0.54 mg/dL (ref 0.50–1.10)
Creatinine, Ser: 0.68 mg/dL (ref 0.50–1.10)
GFR calc Af Amer: >90 mL/min (ref >90)
GFR calc Af Amer: >90 mL/min (ref >90)
GFR calc Af Amer: >90 mL/min (ref >90)
GFR calc Af Amer: >90 mL/min (ref >90)
GFR calc non Af Amer: >90 mL/min (ref >90)
GFR calc non Af Amer: >90 mL/min (ref >90)
GFR calc non Af Amer: 84 mL/min — ABNORMAL LOW (ref >90)
GFR calc non Af Amer: 83 mL/min — ABNORMAL LOW (ref >90)
GFR calc non Af Amer: 81 mL/min — ABNORMAL LOW (ref >90)
Glucose, Bld: 159 mg/dL — ABNORMAL HIGH (ref 70–99)
Glucose, Bld: 148 mg/dL — ABNORMAL HIGH (ref 70–99)
Glucose, Bld: 75 mg/dL (ref 70–99)
Glucose, Bld: 108 mg/dL — ABNORMAL HIGH (ref 70–99)
Glucose, Bld: 159 mg/dL — ABNORMAL HIGH (ref 70–99)
Potassium: 3.7 mEq/L (ref 3.5–5.1)
Potassium: 3.6 mEq/L (ref 3.5–5.1)
Potassium: 3.6 mEq/L (ref 3.5–5.1)
Potassium: 3.9 mEq/L (ref 3.5–5.1)
Sodium: 141 mEq/L (ref 135–145)
Sodium: 140 mEq/L (ref 135–145)

## 2013-07-02 LAB — GLUCOSE, CAPILLARY
Glucose-Capillary: 135 mg/dL — ABNORMAL HIGH (ref 70–99)
Glucose-Capillary: 60 mg/dL — ABNORMAL LOW (ref 70–99)
Glucose-Capillary: 84 mg/dL (ref 70–99)
Glucose-Capillary: 70 mg/dL (ref 70–99)
Glucose-Capillary: 128 mg/dL — ABNORMAL HIGH (ref 70–99)

## 2013-07-02 LAB — CBC WITH DIFFERENTIAL/PLATELET
Basophils Absolute: 0 10*3/uL (ref 0.0–0.1)
Basophils Relative: 0 % (ref 0–1)
Eosinophils Absolute: 0 10*3/uL (ref 0.0–0.7)
Eosinophils Relative: 0 % (ref 0–5)
HCT: 34.7 % — ABNORMAL LOW (ref 36.0–46.0)
Hemoglobin: 12 g/dL (ref 12.0–15.0)
Lymphs Abs: 1.5 10*3/uL (ref 0.7–4.0)
MCH: 30.4 pg (ref 26.0–34.0)
MCHC: 34.6 g/dL (ref 30.0–36.0)
Monocytes Relative: 3 % (ref 3–12)
Neutro Abs: 5.7 10*3/uL (ref 1.7–7.7)
Neutrophils Relative %: 77 % (ref 43–77)
RDW: 13 % (ref 11.5–15.5)

## 2013-07-02 LAB — POCT I-STAT 3, ART BLOOD GAS (G3+)
Bicarbonate: 21 mEq/L (ref 20.0–24.0)
O2 Saturation: 96 %
Patient temperature: 37.6
pH, Arterial: 7.318 — ABNORMAL LOW (ref 7.350–7.450)
pO2, Arterial: 88 mmHg (ref 80.0–100.0)

## 2013-07-02 MED ORDER — VITAL AF 1.2 CAL PO LIQD
1000.0000 mL | ORAL | Status: DC
  Administered 2013-07-03 – 2013-07-05 (×3): 1000 mL
  Filled 2013-07-02 (×5): qty 1000

## 2013-07-02 MED ORDER — PHENYLEPHRINE HCL 10 MG/ML IJ SOLN
30.0000 ug/min | INTRAVENOUS | Status: DC
  Administered 2013-07-02 (×2): 40 ug/min via INTRAVENOUS
  Filled 2013-07-02 (×4): qty 4

## 2013-07-02 MED ORDER — PROPOFOL 10 MG/ML IV EMUL
5.0000 ug/kg/min | INTRAVENOUS | Status: DC
  Administered 2013-07-02: 10 ug/kg/min via INTRAVENOUS
  Administered 2013-07-03: 30 ug/kg/min via INTRAVENOUS
  Filled 2013-07-02 (×2): qty 100

## 2013-07-02 MED ORDER — LEVOTHYROXINE SODIUM 100 MCG IV SOLR
25.0000 ug | Freq: Every day | INTRAVENOUS | Status: DC
  Administered 2013-07-02: 25 ug via INTRAVENOUS
  Filled 2013-07-02 (×2): qty 5

## 2013-07-02 MED ORDER — DEXTROSE 50 % IV SOLN
25.0000 mL | Freq: Once | INTRAVENOUS | Status: AC | PRN
  Administered 2013-07-02: 25 mL via INTRAVENOUS

## 2013-07-02 MED ORDER — DEXTROSE 50 % IV SOLN
INTRAVENOUS | Status: AC
  Filled 2013-07-02: qty 50

## 2013-07-02 MED ORDER — DEXTROSE 50 % IV SOLN: 25.0000 mL | Freq: Once | INTRAVENOUS | Status: AC | PRN

## 2013-07-02 MED ORDER — SODIUM CHLORIDE 0.9 % IV BOLUS (SEPSIS)
750.0000 mL | Freq: Once | INTRAVENOUS | Status: AC
  Administered 2013-07-02: 750 mL via INTRAVENOUS

## 2013-07-02 MED ORDER — VITAL AF 1.2 CAL PO LIQD
1000.0000 mL | ORAL | Status: DC
  Filled 2013-07-02 (×2): qty 1000

## 2013-07-02 NOTE — Progress Notes (Signed)
INITIAL NUTRITION ASSESSMENT  DOCUMENTATION CODES Per approved criteria  -Not Applicable   INTERVENTION: 1.  Enteral nutrition; if enteral nutrition appropriate after rewarming, initiate Vital 1.2 @ 20 mL/hr continuous.  Advance by 10 mL q 4 hrs to 35 mL/hr goal to provide 1008 kcal, 63g protein, 681 mL free water.  NUTRITION DIAGNOSIS: Inadequate oral intake related to inability to eat as evidenced by NPO, vent.   Monitor:  1.  Enteral nutrition; initiation with tolerance.  Pt to meet >/=90% estimated needs with nutrition support.  2.  Wt/wt change; monitor trends  Reason for Assessment: Vent  72 y.o. female  Admitting Dx: Foreign body aspiration  ASSESSMENT: Pt admitted with foreign body aspiration.  Pt with h/o dementia and recurrent aspiration.  She was in cardiac arrest for 25 minutes. Pt is s/p surgical removal of foreign body and placement of ETT.  Currently on hypothermia protocol.   Patient is currently intubated on ventilator support.  MV: 7.3 L/min Temp:Temp (24hrs), Avg:93.2 F (34 C), Min:90.3 F (32.4 C), Max:99.7 F (37.6 C)  Propofol: 5.6 ml/hr providing 147 kcal/d.   Nutrition Focused Physical Exam: Subcutaneous Fat:  Orbital Region: WNL Upper Arm Region: WNL Thoracic and Lumbar Region: WNL  Muscle:  Temple Region: moderate wasting Clavicle Bone Region: mild wasting Clavicle and Acromion Bone Region: WNL Scapular Bone Region: moderate wasting Dorsal Hand: WNL Patellar Region: mild wasting Anterior Thigh Region: unable to assess, hypothermia gear Posterior Calf Region: unable to assess, hypothermia gear  Edema: none present   Height: Ht Readings from Last 1 Encounters:  07/01/13 5\' 4"  (1.626 m)    Weight: Wt Readings from Last 1 Encounters:  07/02/13 105 lb 6.1 oz (47.8 kg)    Ideal Body Weight: 120 lbs  % Ideal Body Weight: 87%  Wt Readings from Last 10 Encounters:  07/02/13 105 lb 6.1 oz (47.8 kg)  07/02/13 105 lb 6.1 oz (47.8  kg)  04/15/13 107 lb 2.3 oz (48.6 kg)  06/13/12 128 lb (58.06 kg)  03/28/12 119 lb 4.3 oz (54.1 kg)  08/09/11 107 lb (48.535 kg)    Usual Body Weight: 110 lbs per chart review  % Usual Body Weight: 95%  BMI:  Body mass index is 18.08 kg/(m^2).  Estimated Nutritional Needs: Kcal: 1048 Protein: 62-71 Fluid: ~1.5 L/day  Skin: intact  Diet Order:  NPO  EDUCATION NEEDS: -Education not appropriate at this time   Intake/Output Summary (Last 24 hours) at 07/02/13 1010 Last data filed at 07/02/13 0800  Gross per 24 hour  Intake 2259.7 ml  Output    430 ml  Net 1829.7 ml    Last BM: PTA  Labs:   Recent Labs Lab 07/01/13 0405 07/01/13 0441  07/01/13 2202 07/02/13 0205 07/02/13 0447  NA 145 140  < > 137 136 138  K 3.1* 3.2*  < > 3.7 3.7 3.6  CL 106 106  < > 105 104 108  CO2  --  22  < > 20 19 20   BUN 14 14  < > 17 17 18   CREATININE 0.60 0.40*  < > 0.44* 0.49* 0.54  CALCIUM  --  7.4*  < > 7.9* 7.9* 7.7*  MG  --  1.7  --   --   --   --   PHOS  --  1.3*  --   --   --   --   GLUCOSE 325* 269*  < > 161* 159* 148*  < > = values in this  interval not displayed.  CBG (last 3)   Recent Labs  07/01/13 2343 07/02/13 0400 07/02/13 0836  GLUCAP 121* 135* 73    Scheduled Meds: . sodium chloride  2,000 mL Intravenous Once  . antiseptic oral rinse  15 mL Mouth Rinse QID  . artificial tears  1 application Both Eyes Q8H  . ceFEPime (MAXIPIME) IV  1 g Intravenous Q12H  . chlorhexidine  15 mL Mouth Rinse BID  . fentaNYL  100 mcg Intravenous Once  . heparin  5,000 Units Subcutaneous BID  . insulin aspart  2-6 Units Subcutaneous Q4H  . insulin glargine  10 Units Subcutaneous Q24H    Continuous Infusions: . cisatracurium (NIMBEX) infusion Stopped (07/02/13 0915)  . dextrose    . fentaNYL infusion INTRAVENOUS 50 mcg/hr (07/02/13 0800)  . insulin (NOVOLIN-R) infusion 2 mL/hr at 07/01/13 1127  . phenylephrine (NEO-SYNEPHRINE) Adult infusion 50 mcg/min (07/02/13 0800)  .  propofol 20 mcg/kg/min (07/02/13 0800)    Past Medical History  Diagnosis Date  . CVA (cerebral vascular accident)     right  . Dementia   . Schizo affective schizophrenia   . Hypertension   . Thyroid disease   . Diabetes mellitus   . Anxiety   . Interstitial cystitis     has stimulator in her back  . Allergic rhinitis   . DDD (degenerative disc disease), lumbar   . Hypercholesterolemia   . Frequent PVCs   . Alzheimer disease   . Spinal stenosis     lumbar bulging discs    Past Surgical History  Procedure Laterality Date  . Tubal ligation    . Interstitial cystitis      surgery for this  . Foot surgery      bilateral feet surgery on the bone  . Implantation / placement epidural neurostimulator electrodes      Loyce Dys, MS RD LDN Clinical Inpatient Dietitian Pager: [phone] Weekend/After hours pager: [phone]

## 2013-07-02 NOTE — Progress Notes (Signed)
Wasted 100 mcg IV Fentanyl with Deanna Reavis RN in med room sink.

## 2013-07-02 NOTE — Progress Notes (Signed)
PULMONARY  / CRITICAL CARE MEDICINE  Name: Kellie King MRN: [id number] DOB: 07/08/1941    ADMISSION DATE:  27-Jul-2013 CONSULTATION DATE:  Jul 27, 2013  REFERRING MD :  ED PRIMARY SERVICE: PCCM  CHIEF COMPLAINT:  Respiratory arrest   BRIEF PATIENT DESCRIPTION:  72  YO CF with hx of Dementia , recurrent food aspiration , today with respiratory arrest resulting in cardiac arrest for 25 ?? minutes , now with severe aspiration of actual pieces of food and inability to ventilate  SIGNIFICANT EVENTS / STUDIES:  11/29 2 bedside bronchs revealing chunks of food in R and L bronchii 11/29 CT head negative for bleed 11/30 OR to have further food aspiration removed 11/30 Echo mild LVH, EF 55-60  LINES / TUBES: 11/29 Left Femoral CVL 11/29 ETT X 2   CULTURES: 11/29 Blood x2 >>  ANTIBIOTICS: Zosyn 11/30 Cefepime>>  SUBJECTIVE:  Currently being rewarmed   VITAL SIGNS: Temp:  [90.3 F (32.4 C)-99.7 F (37.6 C)] 95.7 F (35.4 C) (12/01 0800) Pulse Rate:  [41-68] 54 (12/01 0800) Resp:  [16] 16 (12/01 0800) BP: (77-155)/(16-86) 155/71 mmHg (12/01 0739) SpO2:  [99 %-100 %] 100 % (12/01 0800) Arterial Line BP: (80-174)/(48-91) 93/48 mmHg (12/01 0800) FiO2 (%):  [30 %-40 %] 30 % (12/01 0800) Weight:  [105 lb 6.1 oz (47.8 kg)] 105 lb 6.1 oz (47.8 kg) (12/01 0439) HEMODYNAMICS: CVP:  [0 mmHg-5 mmHg] 5 mmHg VENTILATOR SETTINGS: Vent Mode:  [-] PRVC FiO2 (%):  [30 %-40 %] 30 % Set Rate:  [16 bmp] 16 bmp Vt Set:  [450 mL] 450 mL PEEP:  [5 cmH20] 5 cmH20 Plateau Pressure:  [18 cmH20-20 cmH20] 19 cmH20 INTAKE / OUTPUT: Intake/Output     11/30 0701 - 12/01 0700 12/01 0701 - 12/02 0700   I.V. (mL/kg) 2251.1 (47.1)    IV Piggyback 400    Total Intake(mL/kg) 2651.1 (55.5)    Urine (mL/kg/hr) 560 (0.5)    Total Output 560     Net +2091.1            PHYSICAL EXAMINATION: Constitutional: intubated and sedated, paralysis just turned off HENT:  NCAT, pupils fixed and equal, MM  moist, intubated Cardiovascular: Huston Foley and irregular, no murmurs  Pulmonary/Chest:Bilateral breath sounds, no rhonchi or rales, synchronus with vent Abdominal: Soft. Soft, nontender, absent BS Musculoskeletal: No edema  Neuro: sedated, pupils fixed and equal, no gag Skin: Skin is warm and dry. No rash noted.      LABS:  CBC  Recent Labs Lab 07/01/13 0155  07/01/13 0441 07/01/13 0611 07/02/13 0447  WBC 4.1  --  3.4*  --  7.4  HGB 13.9  < > 14.2 14.6 12.0  HCT 41.4  < > 41.5 43.0 34.7*  PLT 259  --  203  --  188  < > = values in this interval not displayed. Coag's  Recent Labs Lab 07/01/13 0100 07/01/13 0641  APTT 20* 27  INR 0.96 1.11   BMET  Recent Labs Lab 07/01/13 2202 07/02/13 0205 07/02/13 0447  NA 137 136 138  K 3.7 3.7 3.6  CL 105 104 108  CO2 20 19 20   BUN 17 17 18   CREATININE 0.44* 0.49* 0.54  GLUCOSE 161* 159* 148*   Electrolytes  Recent Labs Lab 07/01/13 0441  07/01/13 2202 07/02/13 0205 07/02/13 0447  CALCIUM 7.4*  < > 7.9* 7.9* 7.7*  MG 1.7  --   --   --   --   PHOS 1.3*  --   --   --   --   < > =  values in this interval not displayed. Sepsis Markers  Recent Labs Lab 2013-07-29 1931 07/01/13 0105  LATICACIDVEN 10.36* 2.7*   ABG  Recent Labs Lab Jul 29, 2013 2117 07/01/13 0045 07/01/13 0527  PHART 7.136* 7.368 7.388  PCO2ART 57.8* 39.6 37.6  PO2ART 276.0* 98.0 365.0*   Liver Enzymes  Recent Labs Lab July 29, 2013 1921  AST 19  ALT 9  ALKPHOS 91  BILITOT 0.4  ALBUMIN 2.8*   Cardiac Enzymes  Recent Labs Lab 07/01/13 0115 07/01/13 0841 07/01/13 1315  TROPONINI 1.09* 1.42* 0.87*   Glucose  Recent Labs Lab 07/01/13 1426 07/01/13 1523 07/01/13 1615 07/01/13 2009 07/01/13 2343 07/02/13 0400  GLUCAP 95 102* 106* 125* 121* 135*    Imaging Ct Head Wo Contrast  2013-07-29   CLINICAL DATA:  Patient aspirated cheeseburger. Patient had cardiac arrest with question of anoxic injury. Foreign body removal.  EXAM: CT  HEAD WITHOUT CONTRAST  TECHNIQUE: Contiguous axial images were obtained from the base of the skull through the vertex without intravenous contrast.  COMPARISON:  04/05/2013  FINDINGS: Diffuse cerebral atrophy. Ventricular dilatation consistent with central atrophy. Old bilateral cerebellar infarcts. Low-attenuation changes in the deep white matter consistent with small vessel ischemia. Old lacunar infarcts in the thalamus bilaterally and basal ganglia. Low-attenuation focus in the right pons and midbrain consistent with old ischemia, stable since previous study. No mass effect or midline shift. No abnormal extra-axial fluid collections. Move no evidence of acute intracranial hemorrhage. No depressed skull fractures. Retention cysts in the ethmoid air cells. No air-fluid levels in the paranasal sinuses. Mastoid air cells are not opacified. Vascular calcifications.  IMPRESSION: No evidence of acute intracranial abnormality. Diffuse atrophy, small vessel ischemic changes, old cerebellar infarcts, and old lacune or infarcts, including lacunar infarct in the right brainstem.   Electronically Signed   By: Burman Nieves M.D.   On: 07-29-13 22:18   Dg Chest Port 1 View  07/02/2013   CLINICAL DATA:  Evaluate endotracheal tube.  EXAM: PORTABLE CHEST - 1 VIEW  COMPARISON:  07-29-2013  FINDINGS: Endotracheal tube is 5.7 cm above the carina. Nasogastric tube extends into the abdomen. Cardiac pads overlie the chest. Again noted are patchy interstitial and airspace densities in the central aspect of the right lung which are similar to the previous examination. Left lung appears to be clear. Negative for a pneumothorax.  IMPRESSION: Right lung interstitial and airspace densities.  Support apparatuses as described.   Electronically Signed   By: Richarda Overlie M.D.   On: 07/02/2013 07:44   Dg Chest Portable 1 View  07-29-2013   CLINICAL DATA:  Aspiration.  EXAM: PORTABLE CHEST - 1 VIEW  COMPARISON:  07/29/2013 at 2024 hr   FINDINGS: Endotracheal tube tip measures about 2.5 cm above the carinal. Enteric tube is present. The tip is out of the field of view. The proximal side hole is located about the EG junction region. Normal heart size and pulmonary vascularity. Hazy parenchymal infiltration in the right upper lung consistent with history of aspiration. No pleural effusion. No visible pneumothorax. Pneumomediastinum seen on the previous study is suggested in the periaortic region but is less well seen than on the previous study. The neck base is not included on the current study.  IMPRESSION: The proximal side hole of the enteric tube is located at the EG junction region. This may need to be advanced. Hazy infiltrates in the right upper lung compatible with history of aspiration. Pneumomediastinum is again demonstrated but less prominent than on the  previous study.   Electronically Signed   By: Burman Nieves M.D.   On: 07/07/13 21:33   Dg Chest Portable 1 View  July 07, 2013   CLINICAL DATA:  72 year old female with respiratory difficulty.  EXAM: PORTABLE CHEST - 1 VIEW  COMPARISON:  2013-07-07 and prior chest radiographs  FINDINGS: An endotracheal tube is identified with tip 4.1 cm above carina.  An NG tube is present entering the stomach with tip off the field of view.  Pneumomediastinum is now identified extending into the right neck.  There is no evidence of pneumothorax, airspace disease or definite pleural effusion.  Defibrillator pads overlying the left chest are noted.  IMPRESSION: Endotracheal tube with tip 4.1 cm above the carina.  Pneumomediastinum now identified without evidence of pneumothorax.  NG tube as described.   Electronically Signed   By: Laveda Abbe M.D.   On: 07/07/13 20:53   Dg Chest Portable 1 View  2013-07-07   CLINICAL DATA:  Respiratory difficult  EXAM: PORTABLE CHEST - 1 VIEW  COMPARISON:  06/25/2013  FINDINGS: Endotracheal tube tip is 7.5 cm from the carinal. NG tube tip is beyond the  gastroesophageal junction. Lungs are hyper aerated. There are grossly clear. No pneumothorax.  IMPRESSION: Hyperaeration.  Endotracheal and NG tubes as described.   Electronically Signed   By: Maryclare Bean M.D.   On: 2013-07-07 19:58   CXR:  Patchy infiltrates R>L with severe hyperinflation., ett wnl  ASSESSMENT / PLAN:  PULMONARY A:  Respiratory failure and arrest from aspiration and occlusion of airways - S/p 2 bedside bronch and OR foreign body removal Aspiration PNA   Plan - Cont full vent support - SpO2 > 92% - CXR in am - abg now reviewed, maintain current mV, with re-warm may need higher MV, repeat abg in am  - SBTs consideration  CARDIOVASCULAR A:  PEA arrest secondary to respiratory arrest - 25 minutes of downtime  P: - monitor on tele - rewarming per protocol, for lower MAp goal 60-65 - titrate neo MAP goal 65 once rewarmed -with rewarm, if drops BP, likely to need bolus -ensure tsh -if pressors remain, cortisol send  RENAL A:  At risk ATN P: - high risk brain edema, avoid free water -saline  GASTROINTESTINAL A:  Recurrent aspiration - needs swallow eval prior to eating again, may need PEG  P: - NPO for now - Consider TF if remains intubated -lft in am for shock liver  HEMATOLOGIC A: no issues P: - Heparin subq for DVT prophylaxis -cbc in am   INFECTIOUS A:  Aspiration PNA P: - monitor fever curve and WBCs - Cont cefepime as above, in am may narrow to ceftriaxone or dc  ENDOCRINE A:  Hypothyroid P: - Start levothryroxine 25 mcg IV - SSI -cortisol if remain son pressors -tsh assessment  NEUROLOGIC A:  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  Will repeat a blood gas after nimbex is off and will do a thorough neuro examination once sedation d/c'd.  May need to consider an EEG and head CT after rewarmed for help with further neuro prognostication.  Will continue with Cefepime for aspiration PNA.  If remains intubated  will start with TF.   Ccm time 30 min   Mcarthur Rossetti. Tyson Alias, MD, FACP Pgr: [phone] [place] [facility]

## 2013-07-02 NOTE — Progress Notes (Signed)
Some vent dysnchronuy  Plan Diprivan with neo on board  Dr. Kalman Shan, M.D., [facility].C.P Pulmonary and Critical Care Medicine Staff Physician [place] [facility] [place] [facility] Pager: [phone], If no answer or between  15:00h - 7:00h: call [phone]  07/02/2013 9:50 PM

## 2013-07-02 NOTE — Progress Notes (Signed)
Hypoglycemic Event  CBG: 70  Treatment: 25 ml dextrose  Symptoms: asymptomatic  Follow-up CBG: Time:1800      CBG Result:128  Possible Reasons for Event:Comments/MD notified:NPO, just started tube feeding    Kellie King J  Remember to initiate Hypoglycemia Order Set & complete

## 2013-07-02 NOTE — Progress Notes (Signed)
Hypoglycemic Event  CBG: 60  Treatment: 25 ml dextrose  Symptoms: Asymptomatic  Follow-up CBG: Time:1220 CBG Result: 84  Possible Reasons for Event: arctic sun, npo  Comments/MD notified: per protocol    Delanna Notice J  Remember to initiate Hypoglycemia Order Set & complete

## 2013-07-02 DEATH — deceased

## 2013-07-03 ENCOUNTER — Inpatient Hospital Stay (HOSPITAL_COMMUNITY): Payer: Medicare Other

## 2013-07-03 ENCOUNTER — Encounter (HOSPITAL_COMMUNITY): Payer: Self-pay | Admitting: Cardiothoracic Surgery

## 2013-07-03 DIAGNOSIS — I469 Cardiac arrest, cause unspecified: Secondary | ICD-10-CM

## 2013-07-03 LAB — GLUCOSE, CAPILLARY
Glucose-Capillary: 98 mg/dL (ref 70–99)
Glucose-Capillary: 132 mg/dL — ABNORMAL HIGH (ref 70–99)

## 2013-07-03 LAB — COMPREHENSIVE METABOLIC PANEL
ALT: 50 U/L — ABNORMAL HIGH (ref 0–35)
AST: 94 U/L — ABNORMAL HIGH (ref 0–37)
Albumin: 2 g/dL — ABNORMAL LOW (ref 3.5–5.2)
Alkaline Phosphatase: 129 U/L — ABNORMAL HIGH (ref 39–117)
BUN: 18 mg/dL (ref 6–23)
CO2: 21 mEq/L (ref 19–32)
Chloride: 111 mEq/L (ref 96–112)
Creatinine, Ser: 0.7 mg/dL (ref 0.50–1.10)
Potassium: 3.8 mEq/L (ref 3.5–5.1)
Sodium: 141 mEq/L (ref 135–145)
Total Bilirubin: 0.8 mg/dL (ref 0.3–1.2)

## 2013-07-03 LAB — CORTISOL: Cortisol, Plasma: 84.6 ug/dL

## 2013-07-03 LAB — LACTIC ACID, PLASMA: Lactic Acid, Venous: 1.7 mmol/L (ref 0.5–2.2)

## 2013-07-03 LAB — CBC
HCT: 32.6 % — ABNORMAL LOW (ref 36.0–46.0)
Platelets: 165 10*3/uL (ref 150–400)
RDW: 13.6 % (ref 11.5–15.5)
WBC: 12.3 10*3/uL — ABNORMAL HIGH (ref 4.0–10.5)

## 2013-07-03 LAB — PROCALCITONIN: Procalcitonin: 3.58 ng/mL

## 2013-07-03 MED ORDER — DEXTROSE 5 % IV SOLN
5.0000 mg/h | INTRAVENOUS | Status: DC
  Administered 2013-07-03: 10 mg/h via INTRAVENOUS
  Filled 2013-07-03: qty 100

## 2013-07-03 MED ORDER — LEVOTHYROXINE SODIUM 100 MCG IV SOLR
50.0000 ug | Freq: Every day | INTRAVENOUS | Status: DC
  Administered 2013-07-03 – 2013-07-04 (×2): 50 ug via INTRAVENOUS
  Filled 2013-07-03 (×3): qty 5

## 2013-07-03 MED ORDER — DEXTROSE 5 % IV SOLN
1.0000 g | INTRAVENOUS | Status: DC
  Administered 2013-07-03 – 2013-07-04 (×2): 1 g via INTRAVENOUS
  Filled 2013-07-03 (×2): qty 10

## 2013-07-03 MED ORDER — DILTIAZEM HCL 100 MG IV SOLR
5.0000 mg/h | INTRAVENOUS | Status: DC
  Administered 2013-07-03: 10 mg/h via INTRAVENOUS
  Filled 2013-07-03: qty 100

## 2013-07-03 MED ORDER — DILTIAZEM LOAD VIA INFUSION
10.0000 mg | Freq: Once | INTRAVENOUS | Status: AC
  Administered 2013-07-03: 10 mg via INTRAVENOUS
  Filled 2013-07-03: qty 10

## 2013-07-03 MED ORDER — ACETAMINOPHEN 160 MG/5ML PO SOLN: 650.0000 mg | ORAL | Status: DC | PRN

## 2013-07-03 MED ORDER — HYDROCORTISONE SOD SUCCINATE 100 MG IJ SOLR
50.0000 mg | Freq: Four times a day (QID) | INTRAMUSCULAR | Status: DC
  Administered 2013-07-03 – 2013-07-05 (×8): 50 mg via INTRAVENOUS
  Filled 2013-07-03 (×12): qty 1

## 2013-07-03 MED ORDER — PANTOPRAZOLE SODIUM 40 MG PO PACK
40.0000 mg | PACK | Freq: Every day | ORAL | Status: DC
  Administered 2013-07-04 – 2013-07-05 (×2): 40 mg
  Filled 2013-07-03 (×3): qty 20

## 2013-07-03 MED ORDER — PHENYLEPHRINE HCL 10 MG/ML IJ SOLN
30.0000 ug/min | INTRAVENOUS | Status: DC
  Administered 2013-07-03: 60 ug/min via INTRAVENOUS
  Filled 2013-07-03 (×2): qty 4

## 2013-07-03 MED ORDER — ACETAMINOPHEN 160 MG/5ML PO SOLN: 650.0000 mg | Freq: Four times a day (QID) | ORAL | Status: DC | PRN

## 2013-07-03 NOTE — Progress Notes (Signed)
FEver since noon today  Anti-infectives   Start     Dose/Rate Route Frequency Ordered Stop   07/03/13 1000  cefTRIAXone (ROCEPHIN) 1 g in dextrose 5 % 50 mL IVPB     1 g 100 mL/hr over 30 Minutes Intravenous Every 24 hours 07/03/13 0910     07/01/13 0100  ceFEPIme (MAXIPIME) 1 g in dextrose 5 % 50 mL IVPB  Status:  Discontinued     1 g 100 mL/hr over 30 Minutes Intravenous Every 12 hours 07/01/13 0025 07/03/13 0910   06/29/2013 1945  [MAR Hold]  vancomycin (VANCOCIN) IVPB 1000 mg/200 mL premix     (On MAR Hold since 06/15/2013 2219)   1,000 mg 200 mL/hr over 60 Minutes Intravenous  Once 06/26/2013 1944 06/21/2013 2235      .PLAN Pan culture Check sepsis biomarkers Per tube tylenol for fever   Dr. Kalman Shan, M.D., [facility].C.P Pulmonary and Critical Care Medicine Staff Physician [place] [facility] [place] [facility] Pager: [phone], If no answer or between  15:00h - 7:00h: call [phone]  07/03/2013 5:15 PM

## 2013-07-03 NOTE — Progress Notes (Signed)
EEG completed; results pending.    

## 2013-07-03 NOTE — Progress Notes (Signed)
NUTRITION FOLLOW UP  Intervention:   1. Enteral nutrition; Continue Vital 1.2 @ 35 mL/hr goal to provide 1008 kcal, 63g protein, 681 mL free water.   NUTRITION DIAGNOSIS:  Inadequate oral intake related to inability to eat as evidenced by NPO, vent.   Monitor:  1. Enteral nutrition; initiation with tolerance. Pt to meet >/=90% estimated needs with nutrition support.  2. Wt/wt change; monitor trends  Assessment:   Pt admitted with foreign body aspiration. Pt with h/o dementia and recurrent aspiration. She was in cardiac arrest for 25 minutes. Pt is s/p surgical removal of foreign body and placement of ETT.   Patient is currently intubated on ventilator support. Rewarming completed yesterday.  Pt has been started on TFs. MV: 11.3 L/min Temp:Temp (24hrs), Avg:99.4 F (37.4 C), Min:97 F (36.1 C), Max:100.6 F (38.1 C)  Propofol: off  EEG today for neurologic function.   Height: Ht Readings from Last 1 Encounters:  07/01/13 5\' 4"  (1.626 m)    Weight Status:   Wt Readings from Last 1 Encounters:  07/03/13 109 lb 12.6 oz (49.8 kg)    Re-estimated needs:  Kcal: 1048 Protein: 62-71g Fluid: ~1.5 L/day  Skin: intact  Diet Order:  NPO   Intake/Output Summary (Last 24 hours) at 07/03/13 0941 Last data filed at 07/03/13 0700  Gross per 24 hour  Intake 2229.54 ml  Output   1045 ml  Net 1184.54 ml    Last BM: PTA   Labs:   Recent Labs Lab 07/01/13 0405 07/01/13 0441  07/02/13 1400 07/02/13 1800 07/03/13 0430  NA 145 140  < > 141 140 141  K 3.1* 3.2*  < > 3.7 3.9 3.8  CL 106 106  < > 110 110 111  CO2  --  22  < > 20 20 21   BUN 14 14  < > 15 15 18   CREATININE 0.60 0.40*  < > 0.72 0.75 0.70  CALCIUM  --  7.4*  < > 7.3* 7.5* 7.4*  MG  --  1.7  --   --   --   --   PHOS  --  1.3*  --   --   --   --   GLUCOSE 325* 269*  < > 108* 159* 123*  < > = values in this interval not displayed.  CBG (last 3)   Recent Labs  07/03/13 0029 07/03/13 0409 07/03/13 0741   GLUCAP 97 110* 98    Scheduled Meds: . sodium chloride  2,000 mL Intravenous Once  . antiseptic oral rinse  15 mL Mouth Rinse QID  . cefTRIAXone (ROCEPHIN)  IV  1 g Intravenous Q24H  . chlorhexidine  15 mL Mouth Rinse BID  . feeding supplement (VITAL AF 1.2 CAL)  1,000 mL Per Tube Q24H  . heparin  5,000 Units Subcutaneous BID  . hydrocortisone sodium succinate  50 mg Intravenous Q6H  . insulin aspart  2-6 Units Subcutaneous Q4H  . insulin glargine  10 Units Subcutaneous Q24H  . levothyroxine  50 mcg Intravenous Daily    Continuous Infusions: . dextrose    . insulin (NOVOLIN-R) infusion 2 mL/hr at 07/01/13 1127  . propofol 30 mcg/kg/min (07/03/13 2956)    Loyce Dys, MS RD LDN Clinical Inpatient Dietitian Pager: [phone] Weekend/After hours pager: [phone]

## 2013-07-03 NOTE — Procedures (Signed)
History: 72 yo F s/p cardiac arrest, unresponsive.   Background: The background is disorganized consisting of irregular delta and theta activity. There are periodic 1 - 1.5 Hz periodic sharply contoured discharges with a shifting bifrontal predominance and anterior-posterior lag.   Photic stimulation: Physiologic driving is not performed  EEG Abnormalities: 1) Generalized periodic discharges with triphasic morphology. (triphasic waves) 2) Generalized irregular slow activity  Clinical Interpretation: This  EEG is consistent with a generalized nonspecific cerebral dysfunction. Though generalized periodic discharges can be epileptiform in the right clinical scenario, the shifting bifrontal predominance and characteristic morphology would argue against an epileptiform nature. No definite seizure or seizure predisposition was recorded on this study.   Kellie Slot, MD [facility] [phone]  If 7pm- 7am, please page neurology on call at [phone].

## 2013-07-03 NOTE — Progress Notes (Signed)
PULMONARY  / CRITICAL CARE MEDICINE  Name: AROUSH CHASSE MRN: [id number] DOB: June 02, 1941    ADMISSION DATE:  Jul 01, 2013 CONSULTATION DATE:  07/01/2013  REFERRING MD :  ED PRIMARY SERVICE: PCCM  CHIEF COMPLAINT:  Respiratory arrest   BRIEF PATIENT DESCRIPTION:  72  YO CF with hx of Dementia , recurrent food aspiration , today with respiratory arrest resulting in cardiac arrest for 25 ?? minutes , now with severe aspiration of actual pieces of food and inability to ventilate  SIGNIFICANT EVENTS / STUDIES:  11/29 2 bedside bronchs revealing chunks of food in R and L bronchii 11/29 CT head negative for bleed 11/30 OR to have further food aspiration removed 11/30 Echo mild LVH, EF 55-60  LINES / TUBES: 11/29 Left Femoral CVL 11/29 ETT X 2   CULTURES: 11/29 Blood x2 >> no growth  ANTIBIOTICS: Zosyn 11/30 Cefepime>>12/2 12/2 Ceftriaxone>>  SUBJECTIVE:  Still on neo, but down to 15.  Minimally withdraws to pain, no myoclonus noted by nursing.  Has not weaned yet  VITAL SIGNS: Temp:  [96.6 F (35.9 C)-100.6 F (38.1 C)] 98.8 F (37.1 C) (12/02 0700) Pulse Rate:  [47-108] 88 (12/02 0746) Resp:  [16-25] 22 (12/02 0746) BP: (102-171)/(40-118) 151/118 mmHg (12/02 0746) SpO2:  [96 %-100 %] 100 % (12/02 0746) Arterial Line BP: (81-183)/(38-91) 122/91 mmHg (12/02 0700) FiO2 (%):  [30 %] 30 % (12/02 0746) Weight:  [109 lb 12.6 oz (49.8 kg)] 109 lb 12.6 oz (49.8 kg) (12/02 0443) HEMODYNAMICS: CVP:  [5 mmHg-8 mmHg] 5 mmHg VENTILATOR SETTINGS: Vent Mode:  [-] PRVC FiO2 (%):  [30 %] 30 % Set Rate:  [16 bmp] 16 bmp Vt Set:  [450 mL] 450 mL PEEP:  [5 cmH20] 5 cmH20 Plateau Pressure:  [12 cmH20-15 cmH20] 12 cmH20 INTAKE / OUTPUT: Intake/Output     12/01 0701 - 12/02 0700 12/02 0701 - 12/03 0700   I.V. (mL/kg) 1420.1 (28.5)    NG/GT 370    IV Piggyback 850    Total Intake(mL/kg) 2640.1 (53)    Urine (mL/kg/hr) 1045 (0.9)    Total Output 1045     Net +1595.1             PHYSICAL EXAMINATION: Constitutional: intubated and sedated, does not open eyes to pain HENT:  NCAT, pupils minimally reactive and equal, MM moist, intubated Cardiovascular: regular, no murmurs  Pulmonary/Chest:Bilateral breath sounds, no rhonchi or rales, synchronus with vent Abdominal: Soft, nontender, +BS Musculoskeletal: No edema  Neuro: sedated, pupils minimally reactive and equal, negative dolls eyes, withdraws from pain x4, Skin: Skin is warm and dry. No rash noted.      LABS:  CBC  Recent Labs Lab 07/01/13 0441 07/01/13 0611 07/02/13 0447 07/03/13 0430  WBC 3.4*  --  7.4 12.3*  HGB 14.2 14.6 12.0 10.7*  HCT 41.5 43.0 34.7* 32.6*  PLT 203  --  188 165   Coag's  Recent Labs Lab 07/01/13 0100 07/01/13 0641  APTT 20* 27  INR 0.96 1.11   BMET  Recent Labs Lab 07/02/13 1400 07/02/13 1800 07/03/13 0430  NA 141 140 141  K 3.7 3.9 3.8  CL 110 110 111  CO2 20 20 21   BUN 15 15 18   CREATININE 0.72 0.75 0.70  GLUCOSE 108* 159* 123*   Electrolytes  Recent Labs Lab 07/01/13 0441  07/02/13 1400 07/02/13 1800 07/03/13 0430  CALCIUM 7.4*  < > 7.3* 7.5* 7.4*  MG 1.7  --   --   --   --  PHOS 1.3*  --   --   --   --   < > = values in this interval not displayed. Sepsis Markers  Recent Labs Lab 06/05/2013 1931 07/01/13 0105  LATICACIDVEN 10.36* 2.7*   ABG  Recent Labs Lab 07/01/13 0045 07/01/13 0527 07/02/13 1134  PHART 7.368 7.388 7.318*  PCO2ART 39.6 37.6 41.2  PO2ART 98.0 365.0* 88.0   Liver Enzymes  Recent Labs Lab 06/21/2013 1921 07/03/13 0430  AST 19 94*  ALT 9 50*  ALKPHOS 91 129*  BILITOT 0.4 0.8  ALBUMIN 2.8* 2.0*   Cardiac Enzymes  Recent Labs Lab 07/01/13 0115 07/01/13 0841 07/01/13 1315  TROPONINI 1.09* 1.42* 0.87*   Glucose  Recent Labs Lab 07/02/13 1703 07/02/13 1756 07/02/13 1907 07/03/13 0029 07/03/13 0409 07/03/13 0741  GLUCAP 70 128* 80 97 110* 98    Imaging Dg Chest Port 1 View  07/03/2013    CLINICAL DATA:  Intubated patient, assess positioning of the endotracheal tube.  EXAM: PORTABLE CHEST - 1 VIEW  COMPARISON:  Chest x-ray of July 02, 2013.  FINDINGS: The endotracheal tube tip lies at the level of the superior margin of the clavicular heads approximately 4.7 cm above the crotch of the carina. The lungs are well-expanded. There is hazy increased density in the right lung predominantly in the perihilar region which has become less conspicuous since yesterday's study. The cardiopericardial silhouette is normal in size. The pulmonary vascularity is not engorged. There is no pleural effusion or pneumothorax. The esophagogastric tube tip and proximal port are well below the GE junction. The observed portions of the bony thorax exhibit no acute abnormalities.  IMPRESSION: 1. There has been some improvement in the appearance of the pulmonary interstitium on the right suggesting resolving interstitial edema or pneumonia. 2. The endotracheal tube tip lies approximately 4.7 cm above the crotch of the carina. 3. There is no evidence of pulmonary vascular congestion or pleural effusion or pneumothorax.   Electronically Signed   By: David  [place]   On: 07/03/2013 08:10   Dg Chest Port 1 View  07/02/2013   CLINICAL DATA:  Evaluate endotracheal tube.  EXAM: PORTABLE CHEST - 1 VIEW  COMPARISON:  06/15/2013  FINDINGS: Endotracheal tube is 5.7 cm above the carina. Nasogastric tube extends into the abdomen. Cardiac pads overlie the chest. Again noted are patchy interstitial and airspace densities in the central aspect of the right lung which are similar to the previous examination. Left lung appears to be clear. Negative for a pneumothorax.  IMPRESSION: Right lung interstitial and airspace densities.  Support apparatuses as described.   Electronically Signed   By: Richarda Overlie M.D.   On: 07/02/2013 07:44   CXR:  Patchy infiltrates R>L with improvement, severe hyperinflation., ett wnl  ASSESSMENT /  PLAN:  PULMONARY A:  Respiratory failure and arrest from aspiration and occlusion of airways - S/p 2 bedside bronch and OR foreign body removal Aspiration PNA   Plan - Cont full vent support - SpO2 > 92% - CXR in am - repeat abg now - begin SBTs, cpap5 ps 5, escalate ps if needed, likely   CARDIOVASCULAR A:  PEA arrest secondary to respiratory arrest - 25 minutes of downtime  P: - monitor on tele - MAP goal 60-65 - titrate neo to off, likely able with new MAp goals post re warm -if pressors remain, cortisol send  RENAL A:  At risk ATN P: - high risk brain edema, avoid free water -saline, allow na  to rise  GASTROINTESTINAL Elevated LFTs - likely shock liver A:  Recurrent aspiration - needs swallow eval prior to eating again, may need PEG  P: - Cont TF - Trend LFTs  HEMATOLOGIC A: no issues P: - Heparin subq for DVT prophylaxis -cbc in am   INFECTIOUS A:  Aspiration PNA P: - monitor fever curve and WBCs - D/c cefepime and narrow to ceftriaxone, consider 8 day course  ENDOCRINE A:  Hypothyroid - TSH of 17.45 P: - Cont levothryroxine 25 mcg IV, increase - SSI - cortisol if pressor needs remain, then empiric roids given tsh and increase in IV synthroid  NEUROLOGIC A:  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  Will obtain EEG today for help with prognostication.  CXR mildly improved.  Will narrow abx to ceftriaxone for 8 day course.  Will repeat abg today and adjust vent settings accordingly.  Begin SBTs/weaning.    Ccm time 30 min ]  Mcarthur Rossetti. Tyson Alias, MD, FACP Pgr: [phone] [place]  Pulmonary [facility]

## 2013-07-04 ENCOUNTER — Inpatient Hospital Stay (HOSPITAL_COMMUNITY): Payer: Medicare Other

## 2013-07-04 LAB — COMPREHENSIVE METABOLIC PANEL
Albumin: 2.3 g/dL — ABNORMAL LOW (ref 3.5–5.2)
Alkaline Phosphatase: 134 U/L — ABNORMAL HIGH (ref 39–117)
BUN: 23 mg/dL (ref 6–23)
CO2: 23 mEq/L (ref 19–32)
Creatinine, Ser: 0.69 mg/dL (ref 0.50–1.10)
GFR calc Af Amer: >90 mL/min (ref >90)
GFR calc non Af Amer: 84 mL/min — ABNORMAL LOW (ref >90)
Glucose, Bld: 162 mg/dL — ABNORMAL HIGH (ref 70–99)
Potassium: 3.5 mEq/L (ref 3.5–5.1)
Total Bilirubin: 0.5 mg/dL (ref 0.3–1.2)
Total Protein: 5.5 g/dL — ABNORMAL LOW (ref 6.0–8.3)

## 2013-07-04 LAB — CBC
HCT: 32.6 % — ABNORMAL LOW (ref 36.0–46.0)
Hemoglobin: 10.8 g/dL — ABNORMAL LOW (ref 12.0–15.0)
MCHC: 33.1 g/dL (ref 30.0–36.0)
MCV: 91.1 fL (ref 78.0–100.0)
RBC: 3.58 MIL/uL — ABNORMAL LOW (ref 3.87–5.11)
RDW: 13.7 % (ref 11.5–15.5)
WBC: 14.7 10*3/uL — ABNORMAL HIGH (ref 4.0–10.5)

## 2013-07-04 LAB — GLUCOSE, CAPILLARY
Glucose-Capillary: 116 mg/dL — ABNORMAL HIGH (ref 70–99)
Glucose-Capillary: 143 mg/dL — ABNORMAL HIGH (ref 70–99)
Glucose-Capillary: 171 mg/dL — ABNORMAL HIGH (ref 70–99)
Glucose-Capillary: 185 mg/dL — ABNORMAL HIGH (ref 70–99)

## 2013-07-04 MED ORDER — DEXTROSE 5 % IV SOLN
1.0000 g | INTRAVENOUS | Status: DC
  Administered 2013-07-05: 1 g via INTRAVENOUS
  Filled 2013-07-04 (×2): qty 10

## 2013-07-04 NOTE — Progress Notes (Signed)
Clinical Social Work Department BRIEF PSYCHOSOCIAL ASSESSMENT 07/04/2013  Patient:  Kellie King, Kellie King     Account Number:  [id number]     Admit date:  06/12/2013  Clinical Social Worker:  Varney Biles  Date/Time:  07/04/2013 12:13 PM  Referred by:  Physician  Date Referred:  07/04/2013 Referred for  SNF Placement   Other Referral:   Interview type:  Other - See comment Other interview type:   CSW spoke with [facility] and read notes in chart to complete assessment.    PSYCHOSOCIAL DATA Living Status:  ALONE Admitted from facility:   Level of care:   Primary support name:  [name] ([phone]) Primary support relationship to patient:  CHILD, ADULT Degree of support available:   Unassessed--pt's daughter lives in [place], per chart.    CURRENT CONCERNS Current Concerns  Post-Acute Placement   Other Concerns:    SOCIAL WORK ASSESSMENT / PLAN CSW spoke with RNCM about pt case before completing assessment with pt. RNCM and CSW reviewed MD note from this morning stating pt's daughter is coming in from [place] tomorrow evening or Friday morning, and that pt will be full comfort care after daughter gets to hospital. Consequently, no CSW needs at this time but CSW continues to follow in the event family wants residential hospice.   Assessment/plan status:  Psychosocial Support/Ongoing Assessment of Needs Other assessment/ plan:   Information/referral to community resources:   Following case and will provide support/services as appropriate. Pt will be full comfort care on Thursday evening/Friday morning--if family wants residential hospice, CSW can assist with this. CSW continues to follow case.    PATIENT'S/FAMILY'S RESPONSE TO PLAN OF CARE: N/A       Maryclare Labrador, MSW, [facility] Clinical Social Worker [phone]

## 2013-07-04 NOTE — Progress Notes (Signed)
I have had extensive discussions with family all children. We discussed patients current circumstances and organ failures. We also discussed patient's prior wishes under circumstances such as this. Family has decided to NOT perform resuscitation if arrest but to continue current medical support for now  We discussed the poor prognosis and likely poor quality of life. Family has decided to offer full comfort care. They are aware that the patient may be transferred to palliative care floor for continued comfort care needs. They have been fully updated on the process and expectations.  Will provide comfort after daughter arrives from [place] thur evening or frid am.  Mcarthur Rossetti. Tyson Alias, MD, FACP Pgr: [phone] [facility]

## 2013-07-04 NOTE — Progress Notes (Signed)
PULMONARY  / CRITICAL CARE MEDICINE  Name: Kellie King MRN: [id number] DOB: June 29, 1941    ADMISSION DATE:  Jul 06, 2013 CONSULTATION DATE:  07-06-13  REFERRING MD :  ED PRIMARY SERVICE: PCCM  CHIEF COMPLAINT:  Respiratory arrest   BRIEF PATIENT DESCRIPTION:  73  YO CF with hx of Dementia , recurrent food aspiration , today with respiratory arrest resulting in cardiac arrest for 25 ?? minutes , now with severe aspiration of actual pieces of food and inability to ventilate  SIGNIFICANT EVENTS / STUDIES:  11/29 2 bedside bronchs revealing chunks of food in R and L bronchii 11/29 CT head negative for bleed 11/30 OR to have further food aspiration removed 11/30 Echo mild LVH, EF 55-60 12/2 fib rvr  LINES / TUBES: 11/29 Left Femoral CVL 11/29 ETT X 2   CULTURES: 11/29 Blood x2 >> no growth  ANTIBIOTICS: Zosyn 11/30 Cefepime>>12/2 12/2 Ceftriaxone>>  SUBJECTIVE:  Still on neo, sedation off, RASS -3.    VITAL SIGNS: Temp:  [99.3 F (37.4 C)-100.8 F (38.2 C)] 100 F (37.8 C) (12/03 0800) Pulse Rate:  [25-135] 54 (12/03 0800) Resp:  [16-32] 22 (12/03 0800) BP: (60-163)/(11-94) 107/43 mmHg (12/03 0800) SpO2:  [95 %-100 %] 99 % (12/03 0800) Arterial Line BP: (114-177)/(98-164) 114/99 mmHg (12/02 1030) FiO2 (%):  [30 %] 30 % (12/03 0800) Weight:  [108 lb 11 oz (49.3 kg)] 108 lb 11 oz (49.3 kg) (12/03 0403) HEMODYNAMICS:   VENTILATOR SETTINGS: Vent Mode:  [-] PSV FiO2 (%):  [30 %] 30 % Set Rate:  [16 bmp] 16 bmp Vt Set:  [450 mL] 450 mL PEEP:  [5 cmH20] 5 cmH20 Pressure Support:  [10 cmH20] 10 cmH20 Plateau Pressure:  [14 cmH20-20 cmH20] 15 cmH20 INTAKE / OUTPUT: Intake/Output     12/02 0701 - 12/03 0700 12/03 0701 - 12/04 0700   I.V. (mL/kg) 956.3 (19.4) 15.6 (0.3)   NG/GT 719.8 35   IV Piggyback 50    Total Intake(mL/kg) 1726.2 (35) 50.6 (1)   Urine (mL/kg/hr) 1500 (1.3) 30 (0.4)   Total Output 1500 30   Net +226.2 +20.6        Urine Occurrence 1 x      PHYSICAL EXAMINATION: Constitutional: intubated and sedated, does not open eyes to pain HENT:  NCAT, pupils sluggish reactive and equal, MM moist, intubated Cardiovascular: regular, no murmurs  Pulmonary/Chest:Bilateral breath sounds, no rhonchi or rales, synchronus with vent Abdominal: Soft, nontender, +BS Musculoskeletal: No edema  Neuro: sedated, pupils minimally reactive and equal, negative dolls eyes, withdraws from pain x4, left arm is in flexion Skin: Skin is warm and dry. No rash noted.      LABS:  CBC  Recent Labs Lab 07/02/13 0447 07/03/13 0430 07/03/13 0501  WBC 7.4 12.3* 14.7*  HGB 12.0 10.7* 10.8*  HCT 34.7* 32.6* 32.6*  PLT 188 165 197   Coag's  Recent Labs Lab 07/01/13 0100 07/01/13 0641  APTT 20* 27  INR 0.96 1.11   BMET  Recent Labs Lab 07/02/13 1800 07/03/13 0430 07/03/13 0501  NA 140 141 141  K 3.9 3.8 3.5  CL 110 111 106  CO2 20 21 23   BUN 15 18 23   CREATININE 0.75 0.70 0.69  GLUCOSE 159* 123* 162*   Electrolytes  Recent Labs Lab 07/01/13 0441  07/02/13 1800 07/03/13 0430 07/03/13 0501  CALCIUM 7.4*  < > 7.5* 7.4* 8.4  MG 1.7  --   --   --   --   PHOS  1.3*  --   --   --   --   < > = values in this interval not displayed. Sepsis Markers  Recent Labs Lab 06/20/2013 1931 07/01/13 0105 07/03/13 0501 07/03/13 1850  LATICACIDVEN 10.36* 2.7*  --  1.7  PROCALCITON  --   --  2.71 3.58   ABG  Recent Labs Lab 07/01/13 0045 07/01/13 0527 07/02/13 1134  PHART 7.368 7.388 7.318*  PCO2ART 39.6 37.6 41.2  PO2ART 98.0 365.0* 88.0   Liver Enzymes  Recent Labs Lab 06/22/2013 1921 07/03/13 0430 07/03/13 0501  AST 19 94* 41*  ALT 9 50* 30  ALKPHOS 91 129* 134*  BILITOT 0.4 0.8 0.5  ALBUMIN 2.8* 2.0* 2.3*   Cardiac Enzymes  Recent Labs Lab 07/01/13 0115 07/01/13 0841 07/01/13 1315  TROPONINI 1.09* 1.42* 0.87*   Glucose  Recent Labs Lab 07/03/13 1210 07/03/13 1641 07/03/13 1952 07/03/13 2356 07/04/13 0405  07/04/13 0734  GLUCAP 132* 147* 155* 143* 152* 171*    Imaging Dg Chest Port 1 View  07/04/2013   CLINICAL DATA:  Hypoxia  EXAM: PORTABLE CHEST - 1 VIEW  COMPARISON:  July 03, 2013  FINDINGS: Patient is moderately rotated. Endotracheal tube tip is 4.8 cm above the carina. Nasogastric tube tip and side port extend below the diaphragm. There is a skin fold on the left but no demonstrable pneumothorax.  There is are minimal right perihilar interstitial edema. Lungs are otherwise clear. The heart size and pulmonary vascularity are normal. There is evidence of old trauma in the proximal left humerus, stable.  IMPRESSION: Tube positions as described without pneumothorax. Note that there is a skin fold on the left. There is mild right perihilar interstitial edema, stable. Elsewhere lungs are clear.   Electronically Signed   By: Bretta Bang M.D.   On: 07/04/2013 07:28   Dg Chest Port 1 View  07/03/2013   CLINICAL DATA:  Intubated patient, assess positioning of the endotracheal tube.  EXAM: PORTABLE CHEST - 1 VIEW  COMPARISON:  Chest x-ray of July 02, 2013.  FINDINGS: The endotracheal tube tip lies at the level of the superior margin of the clavicular heads approximately 4.7 cm above the crotch of the carina. The lungs are well-expanded. There is hazy increased density in the right lung predominantly in the perihilar region which has become less conspicuous since yesterday's study. The cardiopericardial silhouette is normal in size. The pulmonary vascularity is not engorged. There is no pleural effusion or pneumothorax. The esophagogastric tube tip and proximal port are well below the GE junction. The observed portions of the bony thorax exhibit no acute abnormalities.  IMPRESSION: 1. There has been some improvement in the appearance of the pulmonary interstitium on the right suggesting resolving interstitial edema or pneumonia. 2. The endotracheal tube tip lies approximately 4.7 cm above the crotch of  the carina. 3. There is no evidence of pulmonary vascular congestion or pleural effusion or pneumothorax.   Electronically Signed   By: David  [place]   On: 07/03/2013 08:10   CXR:  Patchy infiltrates R>L with improvement, severe hyperinflation., ett wnl  ASSESSMENT / PLAN:  PULMONARY A:  Respiratory failure and arrest from aspiration and occlusion of airways - S/p 2 bedside bronch and OR foreign body removal Aspiration PNA   PlaN - begin SBTs, currently on 10/5, if able reduce pS  CARDIOVASCULAR A:  PEA arrest secondary to respiratory arrest - 25 minutes of downtime  afib rvr-back sr P: - monitor on tele -  MAP goal 60-65 - titrate neo to off, likely able with new MAp goals post re warm -initiate solucortef 50 q6, dc as cortisol adaquate -ensure off cardizem  RENAL A:  At risk ATN P: - high risk brain edema, avoid free water -saline, allow na to rise, chem in am   GASTROINTESTINAL Elevated LFTs - likely shock liver, trending down A:  Recurrent aspiration - needs swallow eval prior to eating again, may need PEG  P: - Cont TF - Intermittent LFTs -follow BM, may need addition dulc supp  HEMATOLOGIC A: no issues P: - Heparin subq for DVT prophylaxis -cbc in am   INFECTIOUS A:  Aspiration PNA P: - monitor fever curve and WBCs - Day 4 abx, stop date established  ENDOCRINE A:  Hypothyroid - TSH of 17.45 Likely AI - Cortisol level likely drawn after solucortef given P: - Cont levothryroxine 50 mcgs - SSI - Cont solucortef 50 q6 as pressor needs remain.    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  EEG consistent with cerebral dysfunction.  Family meeting to take place today.  Will push for comfort care as functional recovery is futile at this point.       Ccm time 35 min  Mcarthur Rossetti. Tyson Alias, MD, FACP Pgr: [phone] [facility]

## 2013-07-05 DIAGNOSIS — R7309 Other abnormal glucose: Secondary | ICD-10-CM

## 2013-07-05 LAB — URINE CULTURE: Culture: NO GROWTH

## 2013-07-05 LAB — GLUCOSE, CAPILLARY
Glucose-Capillary: 137 mg/dL — ABNORMAL HIGH (ref 70–99)
Glucose-Capillary: 141 mg/dL — ABNORMAL HIGH (ref 70–99)
Glucose-Capillary: 155 mg/dL — ABNORMAL HIGH (ref 70–99)

## 2013-07-05 MED ORDER — LEVOTHYROXINE SODIUM 50 MCG PO TABS
50.0000 ug | ORAL_TABLET | Freq: Every day | ORAL | Status: DC
  Administered 2013-07-05 – 2013-07-06 (×2): 50 ug via ORAL
  Filled 2013-07-05 (×3): qty 1

## 2013-07-05 NOTE — Progress Notes (Signed)
NUTRITION FOLLOW UP  Intervention:   1. Enteral nutrition; Continue Vital 1.2 @ 35 mL/hr goal to provide 1008 kcal, 63g protein, 681 mL free water as appropriate per GOC.   NUTRITION DIAGNOSIS:  Inadequate oral intake related to inability to eat as evidenced by NPO, vent.   Monitor:  1. Enteral nutrition; initiation with tolerance. Pt to meet >/=90% estimated needs with nutrition support. Met.  2. Wt/wt change; monitor trends. Ongoing.   Assessment:   Pt admitted with foreign body aspiration. Pt with h/o dementia and recurrent aspiration. She was in cardiac arrest for 25 minutes. Pt is s/p surgical removal of foreign body and placement of ETT.   Patient is currently intubated on ventilator support. MV: 11.3 L/min Temp:Temp (24hrs), Avg:99.1 F (37.3 C), Min:98.7 F (37.1 C), Max:99.7 F (37.6 C)  Propofol: off  Pt is transitioning to comfort care; likely later today or tomorrow morning. Tube feeds currently infusing as plan is continued care until transition made.  RD to follow.   Height: Ht Readings from Last 1 Encounters:  07/01/13 5\' 4"  (1.626 m)    Weight Status:   Wt Readings from Last 1 Encounters:  07/05/13 108 lb 14.5 oz (49.4 kg)    Re-estimated needs:  Kcal: 1048 Protein: 62-71g Fluid: ~1.5 L/day  Skin: intact  Diet Order:  NPO   Intake/Output Summary (Last 24 hours) at 07/05/13 0858 Last data filed at 07/05/13 0800  Gross per 24 hour  Intake 1394.1 ml  Output    532 ml  Net  862.1 ml    Last BM: 11/29   Labs:   Recent Labs Lab 07/01/13 0405 07/01/13 0441  07/02/13 1800 07/03/13 0430 07/03/13 0501  NA 145 140  < > 140 141 141  K 3.1* 3.2*  < > 3.9 3.8 3.5  CL 106 106  < > 110 111 106  CO2  --  22  < > 20 21 23   BUN 14 14  < > 15 18 23   CREATININE 0.60 0.40*  < > 0.75 0.70 0.69  CALCIUM  --  7.4*  < > 7.5* 7.4* 8.4  MG  --  1.7  --   --   --   --   PHOS  --  1.3*  --   --   --   --   GLUCOSE 325* 269*  < > 159* 123* 162*  < > =  values in this interval not displayed.  CBG (last 3)   Recent Labs  07/05/13 0007 07/05/13 0412 07/05/13 0716  GLUCAP 141* 137* 112*    Scheduled Meds: . antiseptic oral rinse  15 mL Mouth Rinse QID  . cefTRIAXone (ROCEPHIN)  IV  1 g Intravenous Q24H  . chlorhexidine  15 mL Mouth Rinse BID  . feeding supplement (VITAL AF 1.2 CAL)  1,000 mL Per Tube Q24H  . heparin  5,000 Units Subcutaneous BID  . hydrocortisone sodium succinate  50 mg Intravenous Q6H  . insulin aspart  2-6 Units Subcutaneous Q4H  . insulin glargine  10 Units Subcutaneous Q24H  . levothyroxine  50 mcg Intravenous Daily  . pantoprazole sodium  40 mg Per Tube Q1200    Continuous Infusions: . dextrose    . insulin (NOVOLIN-R) infusion 2 mL/hr at 07/01/13 1127  . phenylephrine (NEO-SYNEPHRINE) Adult infusion Stopped (07/04/13 1000)  . propofol Stopped (07/03/13 1000)    Loyce Dys, MS RD LDN Clinical Inpatient Dietitian Pager: [phone] Weekend/After hours pager: [phone]

## 2013-07-05 NOTE — Progress Notes (Signed)
PULMONARY  / CRITICAL CARE MEDICINE  Name: Kellie King MRN: [id number] DOB: 1940-08-23    ADMISSION DATE:  2013-07-12 CONSULTATION DATE:  July 12, 2013  REFERRING MD :  ED PRIMARY SERVICE: PCCM  CHIEF COMPLAINT:  Respiratory arrest   BRIEF PATIENT DESCRIPTION:  72  YO CF with hx of Dementia , recurrent food aspiration , today with respiratory arrest resulting in cardiac arrest for 25 ?? minutes , now with severe aspiration of actual pieces of food and inability to ventilate  SIGNIFICANT EVENTS / STUDIES:  11/29 2 bedside bronchs revealing chunks of food in R and L bronchii 11/29 CT head negative for bleed 11/30 OR to have further food aspiration removed 11/30 Echo mild LVH, EF 55-60 12/2 fib rvr  LINES / TUBES: 11/29 Left Femoral CVL 11/29 ETT X 2   CULTURES: 11/29 Blood x2 >> no growth  ANTIBIOTICS: Zosyn 11/30 Cefepime>>12/2 12/2 Ceftriaxone>>  SUBJECTIVE:  Off  neo, sedation off, RASS -3.   afebrile  VITAL SIGNS: Temp:  [98.7 F (37.1 C)-99.7 F (37.6 C)] 99.2 F (37.3 C) (12/04 0720) Pulse Rate:  [54-90] 56 (12/04 0900) Resp:  [16-31] 17 (12/04 0900) BP: (84-189)/(32-102) 106/44 mmHg (12/04 0900) SpO2:  [94 %-100 %] 96 % (12/04 0900) FiO2 (%):  [30 %] 30 % (12/04 0808) Weight:  [49.4 kg (108 lb 14.5 oz)] 49.4 kg (108 lb 14.5 oz) (12/04 0500) HEMODYNAMICS:   VENTILATOR SETTINGS: Vent Mode:  [-] PRVC FiO2 (%):  [30 %] 30 % Set Rate:  [16 bmp] 16 bmp Vt Set:  [450 mL] 450 mL PEEP:  [5 cmH20] 5 cmH20 Pressure Support:  [10 cmH20] 10 cmH20 Plateau Pressure:  [14 cmH20-21 cmH20] 20 cmH20 INTAKE / OUTPUT: Intake/Output     12/03 0701 - 12/04 0700 12/04 0701 - 12/05 0700   I.V. (mL/kg) 399.7 (8.1) 20 (0.4)   Other 80    NG/GT 900 65   IV Piggyback     Total Intake(mL/kg) 1379.7 (27.9) 85 (1.7)   Urine (mL/kg/hr) 562 (0.5)    Total Output 562     Net +817.7 +85          PHYSICAL EXAMINATION: Constitutional: intubated and sedated, does not open  eyes to pain HENT:  NCAT, pupils sluggish reactive and equal, MM moist, intubated Cardiovascular: regular, no murmurs  Pulmonary/Chest:Bilateral breath sounds, no rhonchi or rales, synchronus with vent Abdominal: Soft, nontender, +BS Musculoskeletal: No edema  Neuro: sedated, pupils minimally reactive and equal, negative dolls eyes, withdraws from pain x4, left arm is in flexion, does not localise Skin: Skin is warm and dry. No rash noted.      LABS:  CBC  Recent Labs Lab 07/02/13 0447 07/03/13 0430 07/03/13 0501  WBC 7.4 12.3* 14.7*  HGB 12.0 10.7* 10.8*  HCT 34.7* 32.6* 32.6*  PLT 188 165 197   Coag's  Recent Labs Lab 07/01/13 0100 07/01/13 0641  APTT 20* 27  INR 0.96 1.11   BMET  Recent Labs Lab 07/02/13 1800 07/03/13 0430 07/03/13 0501  NA 140 141 141  K 3.9 3.8 3.5  CL 110 111 106  CO2 20 21 23   BUN 15 18 23   CREATININE 0.75 0.70 0.69  GLUCOSE 159* 123* 162*   Electrolytes  Recent Labs Lab 07/01/13 0441  07/02/13 1800 07/03/13 0430 07/03/13 0501  CALCIUM 7.4*  < > 7.5* 7.4* 8.4  MG 1.7  --   --   --   --   PHOS 1.3*  --   --   --   --   < > =  values in this interval not displayed. Sepsis Markers  Recent Labs Lab July 16, 2013 1931 07/01/13 0105 07/03/13 0501 07/03/13 1850  LATICACIDVEN 10.36* 2.7*  --  1.7  PROCALCITON  --   --  2.71 3.58   ABG  Recent Labs Lab 07/01/13 0045 07/01/13 0527 07/02/13 1134  PHART 7.368 7.388 7.318*  PCO2ART 39.6 37.6 41.2  PO2ART 98.0 365.0* 88.0   Liver Enzymes  Recent Labs Lab July 16, 2013 1921 07/03/13 0430 07/03/13 0501  AST 19 94* 41*  ALT 9 50* 30  ALKPHOS 91 129* 134*  BILITOT 0.4 0.8 0.5  ALBUMIN 2.8* 2.0* 2.3*   Cardiac Enzymes  Recent Labs Lab 07/01/13 0115 07/01/13 0841 07/01/13 1315  TROPONINI 1.09* 1.42* 0.87*   Glucose  Recent Labs Lab 07/04/13 1155 07/04/13 1542 07/04/13 2016 07/05/13 0007 07/05/13 0412 07/05/13 0716  GLUCAP 116* 185* 145* 141* 137* 112*     Imaging Dg Chest Port 1 View  07/04/2013   CLINICAL DATA:  Hypoxia  EXAM: PORTABLE CHEST - 1 VIEW  COMPARISON:  July 03, 2013  FINDINGS: Patient is moderately rotated. Endotracheal tube tip is 4.8 cm above the carina. Nasogastric tube tip and side port extend below the diaphragm. There is a skin fold on the left but no demonstrable pneumothorax.  There is are minimal right perihilar interstitial edema. Lungs are otherwise clear. The heart size and pulmonary vascularity are normal. There is evidence of old trauma in the proximal left humerus, stable.  IMPRESSION: Tube positions as described without pneumothorax. Note that there is a skin fold on the left. There is mild right perihilar interstitial edema, stable. Elsewhere lungs are clear.   Electronically Signed   By: Bretta Bang M.D.   On: 07/04/2013 07:28   CXR:  Patchy infiltrates R>L with improvement, severe hyperinflation., ett wnl  ASSESSMENT / PLAN:  PULMONARY A:  Respiratory failure and arrest from aspiration and occlusion of airways - S/p 2 bedside bronch and OR foreign body removal Aspiration PNA   PlaN - plan is for withdrawal after family arrival  CARDIOVASCULAR A:  PEA arrest secondary to respiratory arrest - 25 minutes of downtime  afib rvr-back sr P: - monitor on tele -dc solucortef  -ensure off cardizem  RENAL A:  At risk ATN P: - high risk brain edema, avoid free water   GASTROINTESTINAL Elevated LFTs - likely shock liver, trending down A:  Recurrent aspiration - needs swallow eval prior to eating again, may need PEG  P: - Cont TF - Intermittent LFTs  HEMATOLOGIC A: no issues P: - Heparin subq for DVT prophylaxis   INFECTIOUS A:  Aspiration PNA P: - monitor fever curve and WBCs - Day 5 abx, stop date established  ENDOCRINE A:  Hypothyroid - TSH of 17.45  P: - Cont levothryroxine 50 mcgs - SSI - dc solucortef   NEUROLOGIC A:   Acute Anoxic encephalopathy secondary to cardiac  arrest  No subclinical seizures noted on EEG, cerebral dysfunction noted Dementia P: - apperaing as poor prior fxnal status and now likely poor recovery, consider comfort care - dc Propofol   Toni Amend [employer] Physician Assistant Student 07/04/13, 9:22 AM  TODAY'S SUMMARY: Subclinical seizures ruled out.  EEG consistent with cerebral dysfunction.  Family meeting to take place today.  Will push for comfort care as prognosis  functional recovery is poor at this point.       Care during the described time interval was provided by me and/or other providers on the critical  care team.  I have reviewed this patient's available data, including medical history, events of note, physical examination and test results as part of my evaluation  CC time x 75m  Cyril Mourning MD. FCCP. [facility] Pager [phone] If no response call [phone]

## 2013-07-05 NOTE — Progress Notes (Signed)
RN called me to bedside d/t pt w/ increased RR and increased BP while pt weaning on PS 10.  Found spont RR at 36-37 bpm.  Attempted to increase ps to 15 w/ no noted relief.  Placed pt back on rest mode d/t increased BP.

## 2013-07-06 DIAGNOSIS — Z515 Encounter for palliative care: Secondary | ICD-10-CM

## 2013-07-06 LAB — CULTURE, RESPIRATORY W GRAM STAIN

## 2013-07-06 LAB — GLUCOSE, CAPILLARY: Glucose-Capillary: 149 mg/dL — ABNORMAL HIGH (ref 70–99)

## 2013-07-06 MED ORDER — MORPHINE SULFATE 10 MG/ML IJ SOLN
1.0000 mg/h | INTRAVENOUS | Status: DC
  Administered 2013-07-06: 5 mg/h via INTRAVENOUS
  Administered 2013-07-07: 10 mg/h via INTRAVENOUS
  Administered 2013-07-07: 13 mg/h via INTRAVENOUS
  Filled 2013-07-06 (×5): qty 10

## 2013-07-06 MED ORDER — MORPHINE BOLUS VIA INFUSION
5.0000 mg | INTRAVENOUS | Status: DC | PRN
  Administered 2013-07-07 (×2): 10 mg via INTRAVENOUS
  Administered 2013-07-07: 5 mg via INTRAVENOUS
  Filled 2013-07-06: qty 20

## 2013-07-06 NOTE — Progress Notes (Signed)
Nutrition Brief Note  Chart reviewed. Pt now transitioning to comfort care; has been terminally extubated. No further nutrition interventions warranted at this time.  Please re-consult as needed.   Loyce Dys, MS RD LDN Clinical Inpatient Dietitian Pager: [phone] Weekend/After hours pager: [phone]

## 2013-07-06 NOTE — Progress Notes (Signed)
PULMONARY  / CRITICAL CARE MEDICINE  Name: NELIDA MANDARINO MRN: [id number] DOB: 1940/12/19    ADMISSION DATE:  06/25/2013 CONSULTATION DATE:  07/01/2013  REFERRING MD :  ED PRIMARY SERVICE: PCCM  CHIEF COMPLAINT:  Respiratory arrest   BRIEF PATIENT DESCRIPTION:  72  YO CF with hx of Dementia , recurrent food aspiration , today with respiratory arrest resulting in cardiac arrest for 25 ?? minutes , now with severe aspiration of actual pieces of food and inability to ventilate  SIGNIFICANT EVENTS / STUDIES:  11/29 2 bedside bronchs revealing chunks of food in R and L bronchii 11/29 CT head negative for bleed 11/30 OR to have further food aspiration removed 11/30 Echo mild LVH, EF 55-60 12/2 fib rvr  LINES / TUBES: 11/29 Left Femoral CVL 11/29 ETT X 2   CULTURES: 11/29 Blood x2 >> no growth  ANTIBIOTICS: Zosyn 11/30 Cefepime>>12/2 12/2 Ceftriaxone>>  SUBJECTIVE:  Off  neo, sedation off, RASS -3.   afebrile  VITAL SIGNS: Temp:  [98 F (36.7 C)-100.1 F (37.8 C)] 100.1 F (37.8 C) (12/05 0800) Pulse Rate:  [55-89] 89 (12/05 0800) Resp:  [16-23] 16 (12/05 0800) BP: (106-176)/(44-91) 150/60 mmHg (12/05 0800) SpO2:  [94 %-100 %] 100 % (12/05 0800) FiO2 (%):  [30 %] 30 % (12/05 0800) HEMODYNAMICS:   VENTILATOR SETTINGS: Vent Mode:  [-] PRVC FiO2 (%):  [30 %] 30 % Set Rate:  [16 bmp] 16 bmp Vt Set:  [450 mL] 450 mL PEEP:  [5 cmH20] 5 cmH20 Plateau Pressure:  [17 cmH20-20 cmH20] 20 cmH20 INTAKE / OUTPUT: Intake/Output     12/04 0701 - 12/05 0700 12/05 0701 - 12/06 0700   I.V. (mL/kg) 220 (4.5)    Other 30    NG/GT 830 35   IV Piggyback 50    Total Intake(mL/kg) 1130 (22.9) 35 (0.7)   Urine (mL/kg/hr) 486 (0.4) 60 (0.6)   Total Output 486 60   Net +644 -25          PHYSICAL EXAMINATION: Constitutional: intubated and comatose HENT:  NCAT, pupils sluggish reactive and equal, MM moist, intubated Cardiovascular: regular, no murmurs   Pulmonary/Chest:Bilateral breath sounds, no rhonchi or rales, synchronus with vent Abdominal: Soft, nontender, +BS Musculoskeletal: No edema  Neuro: pupils minimally reactive and equal, negative dolls eyes, withdraws from pain x4, left arm is in flexion, does not localise, does not open eyes to pain Skin: Skin is warm and dry. No rash noted.      LABS:  CBC  Recent Labs Lab 07/02/13 0447 07/03/13 0430 07/03/13 0501  WBC 7.4 12.3* 14.7*  HGB 12.0 10.7* 10.8*  HCT 34.7* 32.6* 32.6*  PLT 188 165 197   Coag's  Recent Labs Lab 07/01/13 0100 07/01/13 0641  APTT 20* 27  INR 0.96 1.11   BMET  Recent Labs Lab 07/02/13 1800 07/03/13 0430 07/03/13 0501  NA 140 141 141  K 3.9 3.8 3.5  CL 110 111 106  CO2 20 21 23   BUN 15 18 23   CREATININE 0.75 0.70 0.69  GLUCOSE 159* 123* 162*   Electrolytes  Recent Labs Lab 07/01/13 0441  07/02/13 1800 07/03/13 0430 07/03/13 0501  CALCIUM 7.4*  < > 7.5* 7.4* 8.4  MG 1.7  --   --   --   --   PHOS 1.3*  --   --   --   --   < > = values in this interval not displayed. Sepsis Markers  Recent Labs Lab 06/05/2013  1931 07/01/13 0105 07/03/13 0501 07/03/13 1850  LATICACIDVEN 10.36* 2.7*  --  1.7  PROCALCITON  --   --  2.71 3.58   ABG  Recent Labs Lab 07/01/13 0045 07/01/13 0527 07/02/13 1134  PHART 7.368 7.388 7.318*  PCO2ART 39.6 37.6 41.2  PO2ART 98.0 365.0* 88.0   Liver Enzymes  Recent Labs Lab 07/16/2013 1921 07/03/13 0430 07/03/13 0501  AST 19 94* 41*  ALT 9 50* 30  ALKPHOS 91 129* 134*  BILITOT 0.4 0.8 0.5  ALBUMIN 2.8* 2.0* 2.3*   Cardiac Enzymes  Recent Labs Lab 07/01/13 0115 07/01/13 0841 07/01/13 1315  TROPONINI 1.09* 1.42* 0.87*   Glucose  Recent Labs Lab 07/05/13 0716 07/05/13 1126 07/05/13 1521 07/05/13 2323 07/06/13 0456 07/06/13 0811  GLUCAP 112* 155* 116* 136* 157* 149*    Imaging No results found. CXR:  NNF  ASSESSMENT / PLAN:  PULMONARY A:  Respiratory failure and  arrest from aspiration and occlusion of airways - S/p 2 bedside bronch and OR foreign body removal Aspiration PNA   PlaN - plan is for withdrawal etoday - Titrate morphine drip for RR > 18 or labored breathing  CARDIOVASCULAR A:  PEA arrest secondary to respiratory arrest - 25 minutes of downtime  afib rvr-back sr P: - monitor on tele -dc solucortef  -ensure off cardizem  RENAL A:  At risk ATN P: - high risk brain edema, avoid free water - d/c Bmets  GASTROINTESTINAL Elevated LFTs - likely shock liver, trending down A:  Recurrent aspiration - needs swallow eval prior to eating again, may need PEG  P: - D/c tube feeds - D/c LFTs  HEMATOLOGIC A: no issues P: - D/c heparin for prophylaxis  INFECTIOUS A:  Aspiration PNA P: - monitor fever curve and WBCs - Day 5 abx, stop date established  ENDOCRINE A:  Hypothyroid - TSH of 17.45  P: - D/c levothyroxine - dc solucortef  - d/c cbgs  NEUROLOGIC A:   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 Physician Assistant Student 07/06/13, 8:54 AM  TODAY'S SUMMARY: Terminal extubation to take place today.  All family has arrived.  Will titrate morphine drip for comfort.  All lab work has been d/c'd at this time.  Will also d/c abx.    Care during the described time interval was provided by me and/or other providers on the critical care team.  I have reviewed this patient's available data, including medical history, events of note, physical examination and test results as part of my evaluation  CC time x 31 m  Cyril Mourning MD. Tonny Bollman. [place] [facility] Pager [phone] If no response call [phone]

## 2013-07-06 NOTE — Progress Notes (Signed)
Chaplain was paged to 2H to be with the family of actively dying pt.  Chaplain had prayer with the family and provided a ministry of presence.  Chaplain also engaged in conversation with the family.  It seems that it has been some time since they all together as one family, mentioning that it had been "30 years since they had seen their uncle."  Yet, there seemed to be a strong sense of support and comfort among them, embracing each other through their grief.  Chaplain ended visit and family appeared grateful for the visit.

## 2013-07-06 NOTE — Procedures (Signed)
Extubation Procedure Note  Patient Details:   Name: Kellie King DOB: 1940/09/21 MRN: [id number]   Airway Documentation:     Evaluation  O2 sats: stable throughout Complications: No apparent complications Patient did tolerate procedure well. Bilateral Breath Sounds: Rhonchi Suctioning: Airway No, pt not able to speak d/t pt was extubated at family request for terminal extubation.  RN at bedside to ensure pt appears comfortable, pt appeared comfortable t/o terminal wean/extubation.    Jennette Kettle 07/06/2013, 2:53 PM

## 2013-07-07 DIAGNOSIS — Z515 Encounter for palliative care: Secondary | ICD-10-CM

## 2013-07-07 LAB — CULTURE, BLOOD (ROUTINE X 2)
Culture: NO GROWTH
Culture: NO GROWTH

## 2013-07-07 MED ORDER — SODIUM CHLORIDE 0.9 % IV SOLN
1.0000 mg/h | INTRAVENOUS | Status: DC
  Administered 2013-07-07: 13 mg/h via INTRAVENOUS
  Filled 2013-07-07 (×2): qty 10

## 2013-07-07 NOTE — Progress Notes (Signed)
Comfortable on MSO4 gtt. Fully unresponsive. Brother @ bedside. Cont full comfort care   Billy Fischer, MD ; [facility] [phone].  After 5:30 PM or weekends, call [phone]

## 2013-07-07 NOTE — Progress Notes (Signed)
Pt on comfort care. Family at bedside. No distress observed. Morphine gtt at 13 mg/hr.

## 2013-07-07 NOTE — Progress Notes (Signed)
Patient is on comfort care. No family at bedside. Morphine gtt @ 13mg /hr.  Patient repositioned.

## 2013-07-10 LAB — CULTURE, BLOOD (ROUTINE X 2): Culture: NO GROWTH

## 2013-07-13 NOTE — Discharge Summary (Signed)
PULMONARY  / CRITICAL CARE MEDICINE  Name: Kellie King MRN: [id number] DOB: [birth year]    ADMISSION DATE:  06/02/2013 CONSULTATION DATE:  06/19/2013  REFERRING MD :  ED PRIMARY SERVICE: PCCM  CHIEF COMPLAINT:  Respiratory arrest   BRIEF PATIENT DESCRIPTION:  72  YO CF with hx of Dementia , recurrent food aspiration , today with respiratory arrest resulting in cardiac arrest for 25 ?? minutes , now with severe aspiration of actual pieces of food and inability to ventilate  SIGNIFICANT EVENTS / STUDIES:  11/29 2 bedside bronchs revealing chunks of food in R and L bronchii 11/29 CT head negative for bleed 11/30 OR to have further food aspiration removed 11/30 Echo mild LVH, EF 55-60 12/2 fib rvr  LINES / TUBES: 11/29 Left Femoral CVL 11/29 ETT X 2   CULTURES: 11/29 Blood x2 >> no growth  ANTIBIOTICS: Zosyn 11/30 Cefepime>>12/2 12/2 Ceftriaxone>>  COURSE :  PULMONARY A:  Respiratory failure and arrest from aspiration and occlusion of airways - S/p 2 bedside bronch and OR foreign body removal by CVTS Aspiration PNA    CARDIOVASCULAR A:  PEA arrest secondary to respiratory arrest - 25 minutes of downtime  afib rvr-back sr    GASTROINTESTINAL Elevated LFTs - likely shock liver, trending down A:  Recurrent aspiration   INFECTIOUS A:  Aspiration PNA   ENDOCRINE A:  Hypothyroid - TSH of 17.45    NEUROLOGIC A:   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 FCCP. [place] [facility] Pager [phone] If no response call [phone]

## 2013-08-02 NOTE — Progress Notes (Signed)
Family called at 64. Patient time of death occurred at 25. Verified by Elmyra Ricks RN and Ron Agee RN.

## 2013-08-02 DEATH — deceased

## 2014-09-19 IMAGING — CR DG CHEST 1V PORT
1 series · 1 of 1 positions shown · non-contrast
Comparison: 06/30/2013 at 2326 hr

CLINICAL DATA: Aspiration.

EXAM:
PORTABLE CHEST - 1 VIEW

[AP]
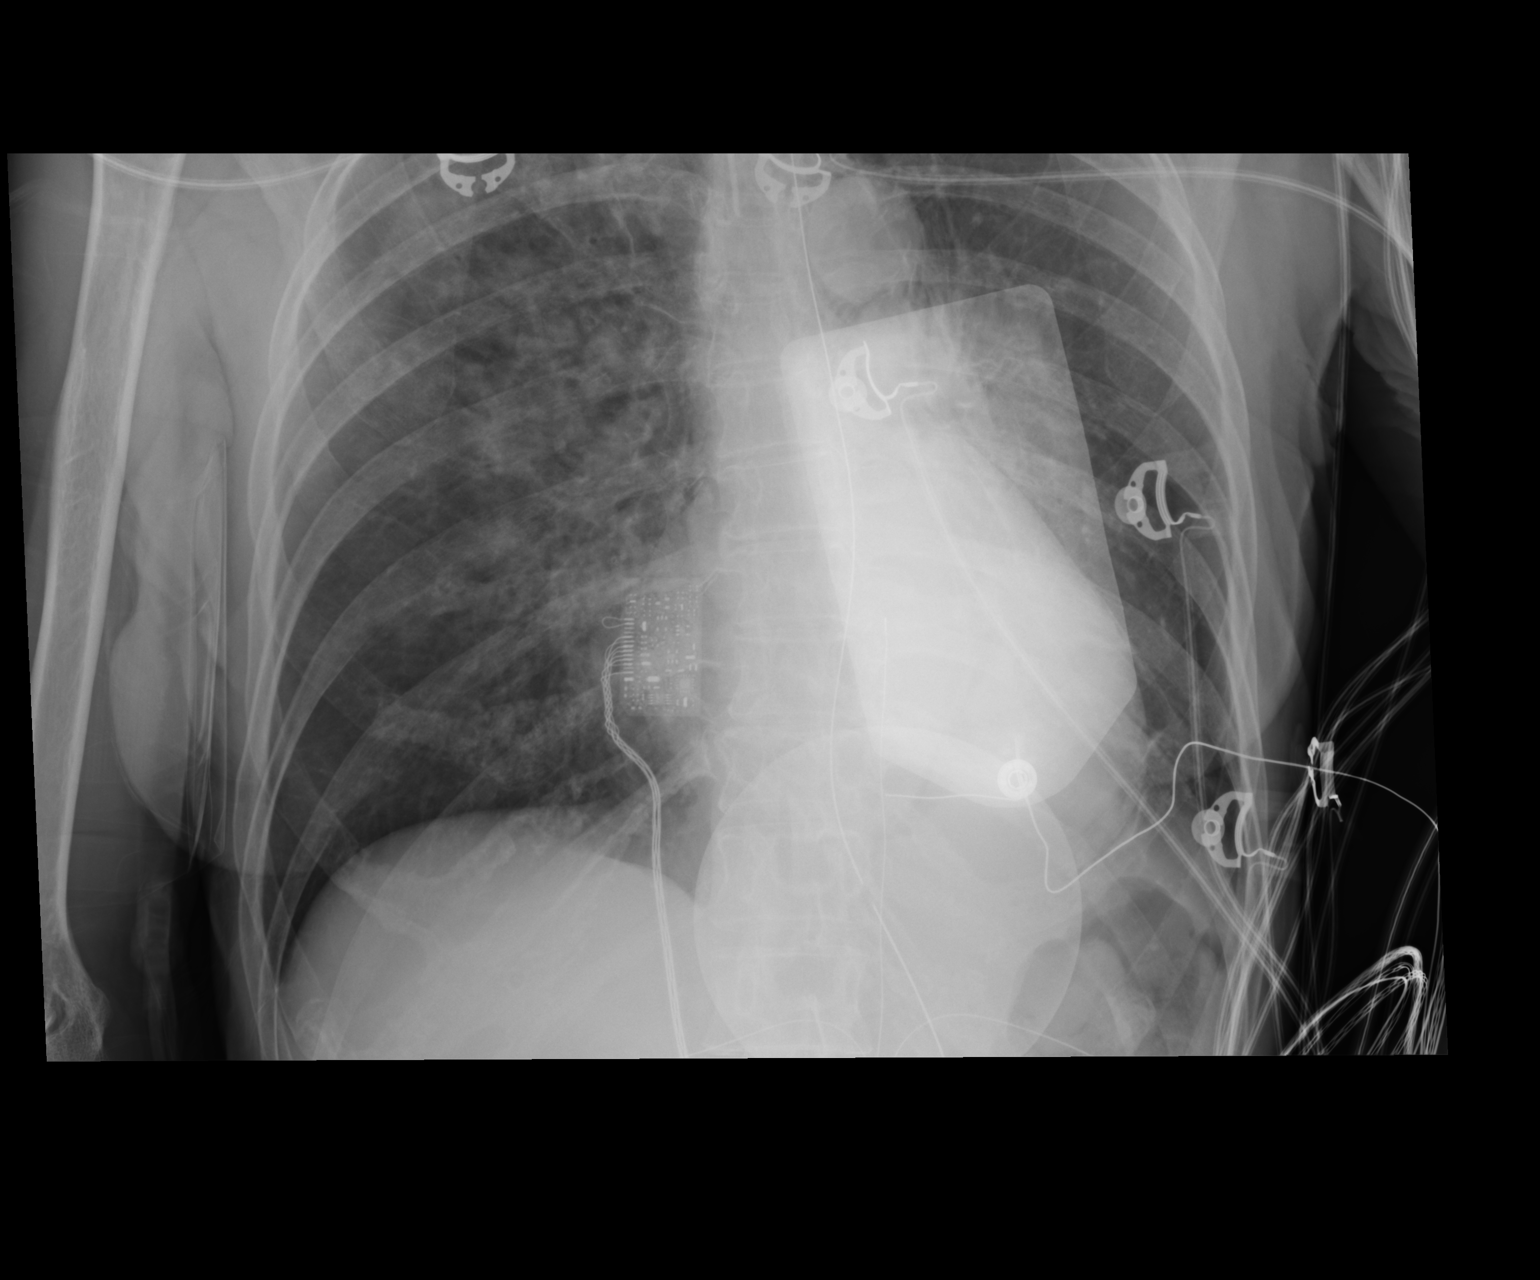

[1 of 1 positions shown; findings below may reference images not displayed]

FINDINGS: Endotracheal tube tip measures about 2.5 cm above the carinal.
Enteric tube is present. The tip is out of the field of view. The
proximal side hole is located about the EG junction region. Normal
heart size and pulmonary vascularity. Hazy parenchymal infiltration
in the right upper lung consistent with history of aspiration. No
pleural effusion. No visible pneumothorax. Pneumomediastinum seen on
the previous study is suggested in the periaortic region but is less
well seen than on the previous study. The neck base is not included
on the current study.
IMPRESSION: The proximal side hole of the enteric tube is located at the EG
junction region. This may need to be advanced. Hazy infiltrates in
the right upper lung compatible with history of aspiration.
Pneumomediastinum is again demonstrated but less prominent than on
the previous study.

## 2014-09-19 IMAGING — CT CT HEAD W/O CM
1 series · 15 of 30 positions shown, 19 images · non-contrast
Comparison: 04/05/2013

CLINICAL DATA: Patient aspirated cheeseburger. Patient had cardiac
arrest with question of anoxic injury. Foreign body removal.

EXAM:
CT HEAD WITHOUT CONTRAST
TECHNIQUE: Contiguous axial images were obtained from the base of the skull
through the vertex without intravenous contrast.

[Series 2: head 5.0 h30s · axial · 0.39mm/px · z∈[+528,+663]mm · 15 of 31 slices shown, 19 images]
[im 2/31  brain]
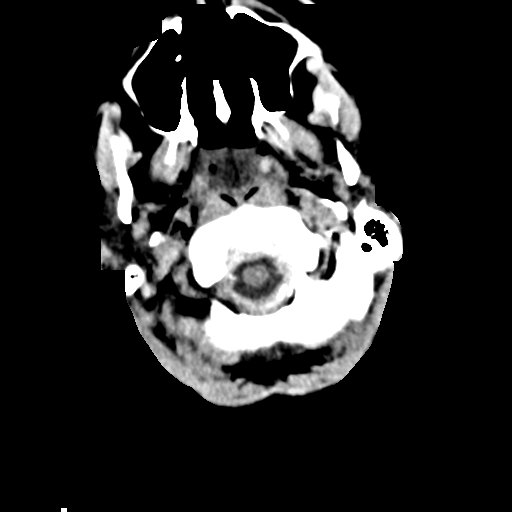
[im 2/31  bone]
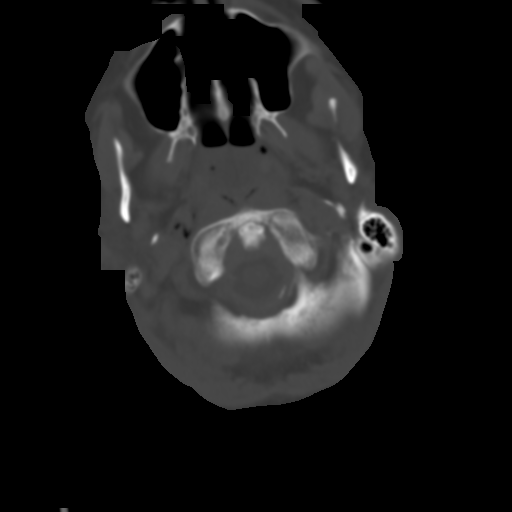
[im 4/31  brain]
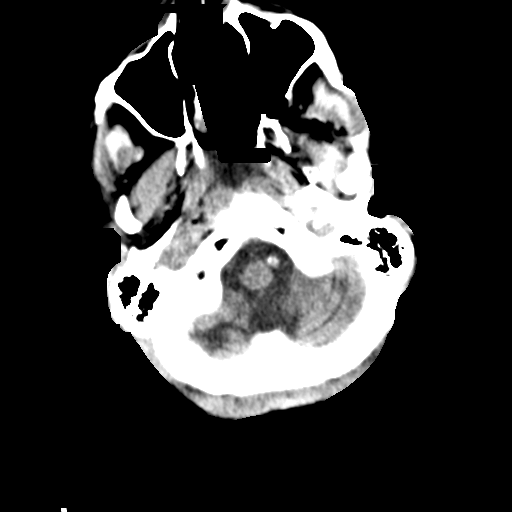
[im 6/31  brain]
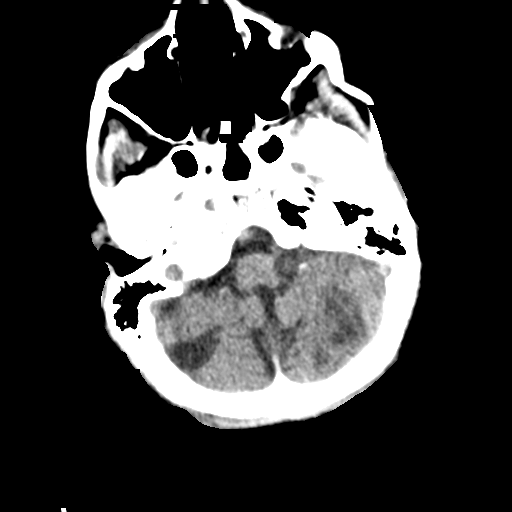
[im 8/31  brain]
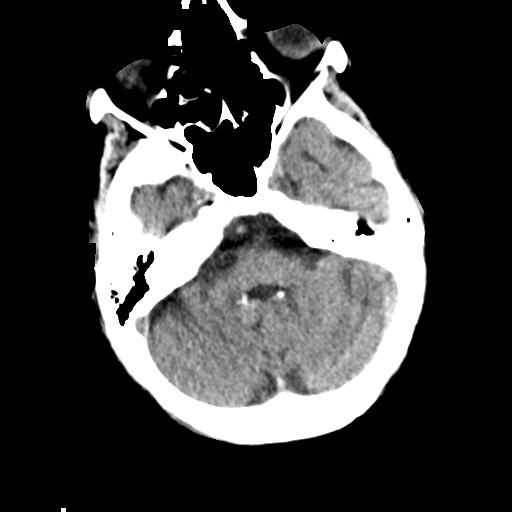
[im 10/31  brain]
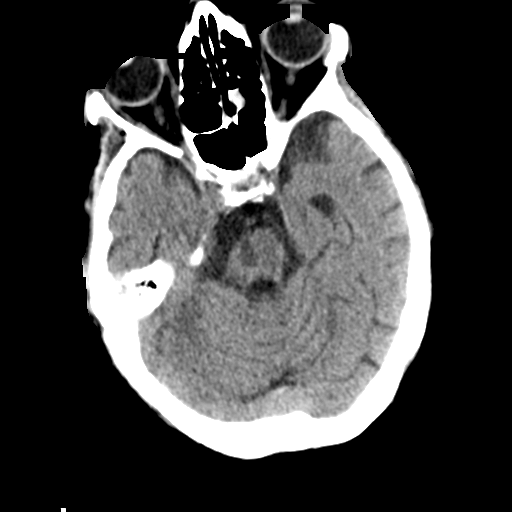
[im 10/31  bone]
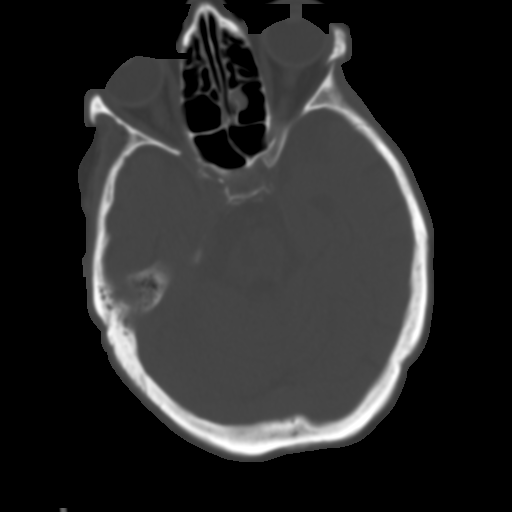
[im 12/31  brain]
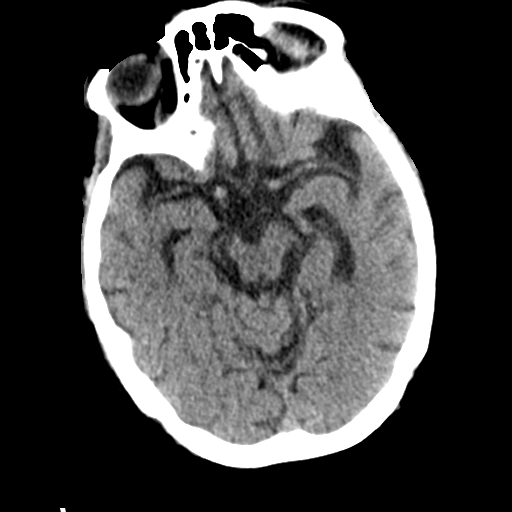
[im 14/31  brain]
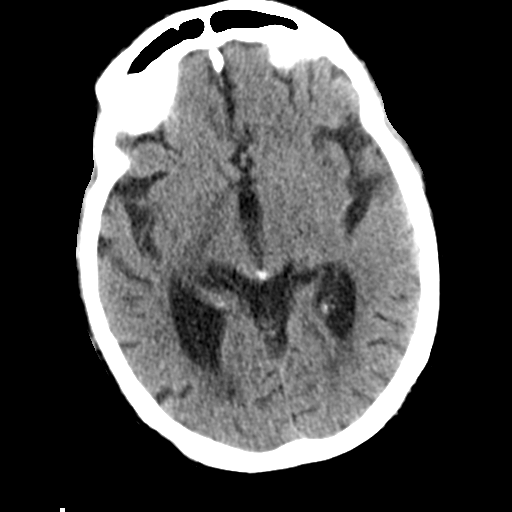
[im 16/31  brain]
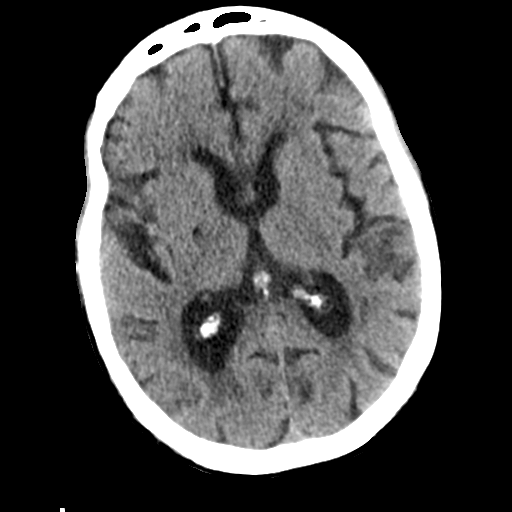
[im 17/31  brain]
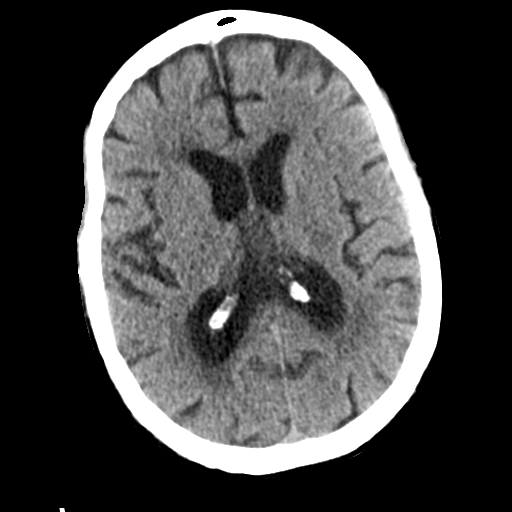
[im 17/31  bone]
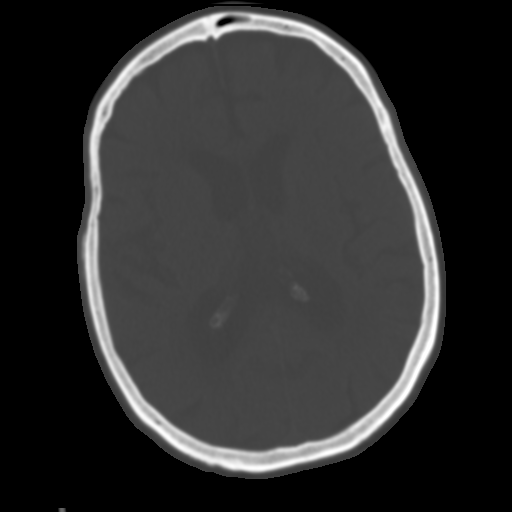
[im 19/31  brain]
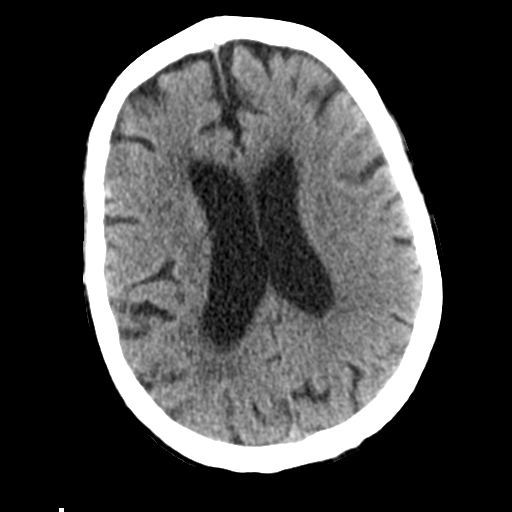
[im 21/31  brain]
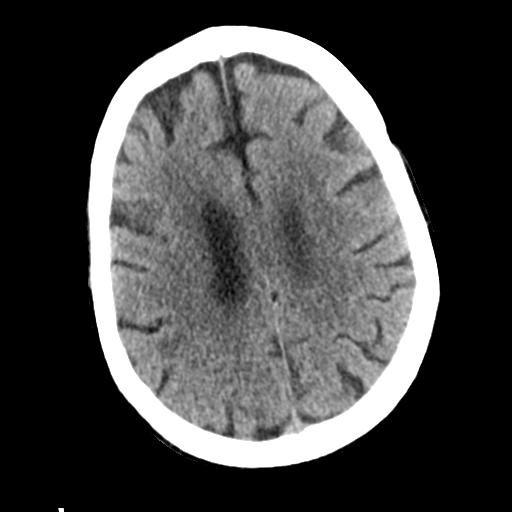
[im 23/31  brain]
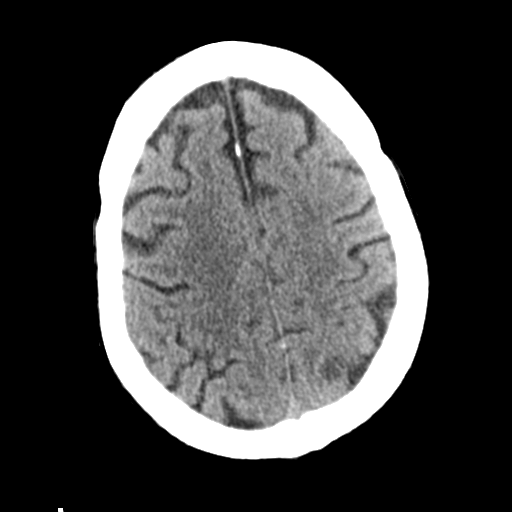
[im 25/31  brain]
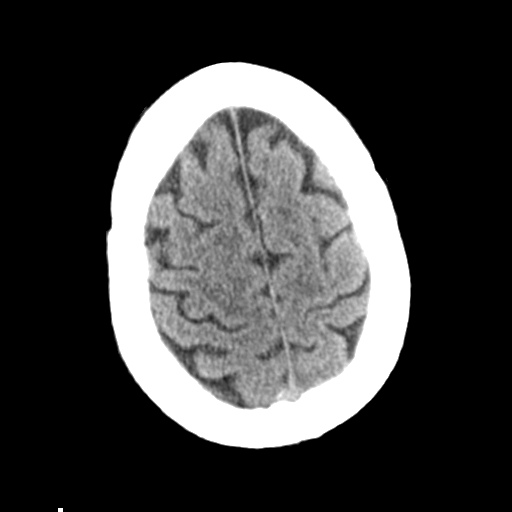
[im 25/31  bone]
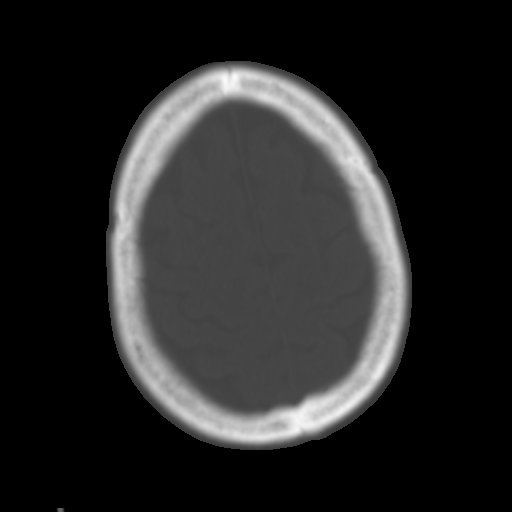
[im 27/31  brain]
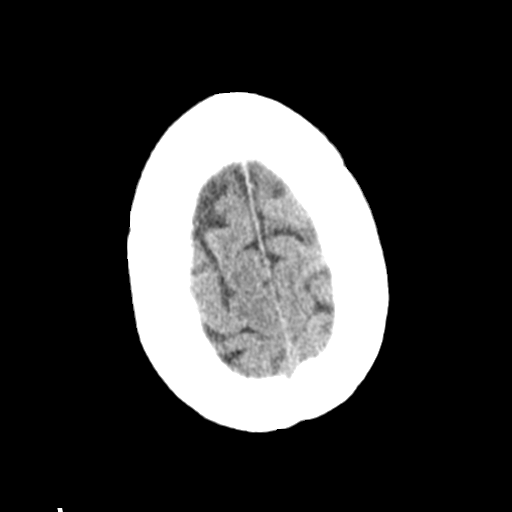
[im 29/31  brain]
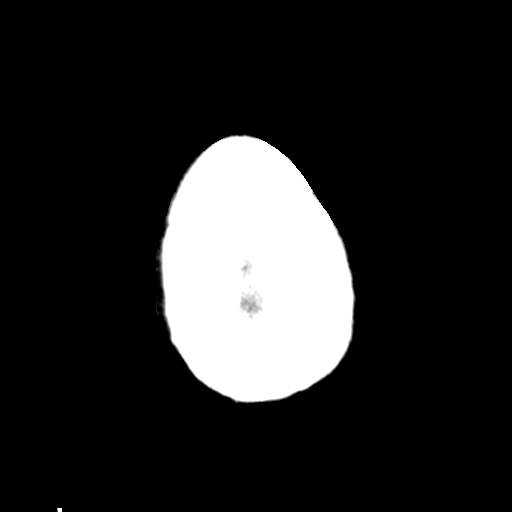

[15 of 30 positions shown; findings below may reference images not displayed]

FINDINGS: Diffuse cerebral atrophy. Ventricular dilatation consistent with
central atrophy. Old bilateral cerebellar infarcts. Low-attenuation
changes in the deep white matter consistent with small vessel
ischemia. Old lacunar infarcts in the thalamus bilaterally and basal
ganglia. Low-attenuation focus in the right pons and midbrain
consistent with old ischemia, stable since previous study. No mass
effect or midline shift. No abnormal extra-axial fluid collections.
Move no evidence of acute intracranial hemorrhage. No depressed
skull fractures. Retention cysts in the ethmoid air cells. No
air-fluid levels in the paranasal sinuses. Mastoid air cells are not
opacified. Vascular calcifications.
IMPRESSION: No evidence of acute intracranial abnormality. Diffuse atrophy,
small vessel ischemic changes, old cerebellar infarcts, and old
lacune or infarcts, including lacunar infarct in the right
brainstem.
